# Patient Record
Sex: Female | Born: 1935 | Race: White | Hispanic: No | Marital: Married | Smoking: Never smoker
Health system: Southern US, Community
[De-identification: ages and names within clinical notes are randomized; demographics above are authoritative.]

## PROBLEM LIST (undated history)

## (undated) DIAGNOSIS — N183 Chronic kidney disease, stage 3 unspecified: Secondary | ICD-10-CM

## (undated) DIAGNOSIS — E559 Vitamin D deficiency, unspecified: Secondary | ICD-10-CM

## (undated) DIAGNOSIS — I4821 Permanent atrial fibrillation: Secondary | ICD-10-CM

## (undated) DIAGNOSIS — E785 Hyperlipidemia, unspecified: Secondary | ICD-10-CM

## (undated) DIAGNOSIS — J449 Chronic obstructive pulmonary disease, unspecified: Secondary | ICD-10-CM

## (undated) DIAGNOSIS — E669 Obesity, unspecified: Secondary | ICD-10-CM

## (undated) DIAGNOSIS — H3530 Unspecified macular degeneration: Secondary | ICD-10-CM

## (undated) DIAGNOSIS — M1990 Unspecified osteoarthritis, unspecified site: Secondary | ICD-10-CM

## (undated) DIAGNOSIS — M8580 Other specified disorders of bone density and structure, unspecified site: Secondary | ICD-10-CM

## (undated) DIAGNOSIS — K635 Polyp of colon: Secondary | ICD-10-CM

## (undated) DIAGNOSIS — R519 Headache, unspecified: Secondary | ICD-10-CM

## (undated) DIAGNOSIS — R945 Abnormal results of liver function studies: Secondary | ICD-10-CM

## (undated) DIAGNOSIS — I272 Pulmonary hypertension, unspecified: Secondary | ICD-10-CM

## (undated) DIAGNOSIS — G4733 Obstructive sleep apnea (adult) (pediatric): Secondary | ICD-10-CM

## (undated) DIAGNOSIS — I350 Nonrheumatic aortic (valve) stenosis: Secondary | ICD-10-CM

## (undated) DIAGNOSIS — I10 Essential (primary) hypertension: Secondary | ICD-10-CM

## (undated) DIAGNOSIS — R7989 Other specified abnormal findings of blood chemistry: Secondary | ICD-10-CM

## (undated) DIAGNOSIS — R51 Headache: Secondary | ICD-10-CM

## (undated) DIAGNOSIS — I340 Nonrheumatic mitral (valve) insufficiency: Secondary | ICD-10-CM

## (undated) DIAGNOSIS — E039 Hypothyroidism, unspecified: Secondary | ICD-10-CM

## (undated) DIAGNOSIS — R413 Other amnesia: Secondary | ICD-10-CM

## (undated) HISTORY — DX: Polyp of colon: K63.5

## (undated) HISTORY — PX: BREAST BIOPSY: SHX20

## (undated) HISTORY — DX: Chronic obstructive pulmonary disease, unspecified: J44.9

## (undated) HISTORY — DX: Unspecified osteoarthritis, unspecified site: M19.90

## (undated) HISTORY — PX: CHOLECYSTECTOMY: SHX55

## (undated) HISTORY — DX: Unspecified macular degeneration: H35.30

## (undated) HISTORY — DX: Headache, unspecified: R51.9

## (undated) HISTORY — DX: Nonrheumatic mitral (valve) insufficiency: I34.0

## (undated) HISTORY — DX: Vitamin D deficiency, unspecified: E55.9

## (undated) HISTORY — DX: Obstructive sleep apnea (adult) (pediatric): G47.33

## (undated) HISTORY — DX: Essential (primary) hypertension: I10

## (undated) HISTORY — DX: Hypothyroidism, unspecified: E03.9

## (undated) HISTORY — DX: Obesity, unspecified: E66.9

## (undated) HISTORY — DX: Nonrheumatic aortic (valve) stenosis: I35.0

## (undated) HISTORY — DX: Other specified disorders of bone density and structure, unspecified site: M85.80

## (undated) HISTORY — DX: Chronic kidney disease, stage 3 (moderate): N18.3

## (undated) HISTORY — DX: Abnormal results of liver function studies: R94.5

## (undated) HISTORY — DX: Hyperlipidemia, unspecified: E78.5

## (undated) HISTORY — DX: Chronic kidney disease, stage 3 unspecified: N18.30

## (undated) HISTORY — DX: Other specified abnormal findings of blood chemistry: R79.89

## (undated) HISTORY — DX: Headache: R51

## (undated) HISTORY — PX: KNEE SURGERY: SHX244

## (undated) HISTORY — DX: Other amnesia: R41.3

## (undated) HISTORY — DX: Permanent atrial fibrillation: I48.21

## (undated) HISTORY — DX: Pulmonary hypertension, unspecified: I27.20

---

## 1999-09-20 ENCOUNTER — Encounter: Admission: RE | Admit: 1999-09-20 | Discharge: 1999-09-20 | Payer: Self-pay | Admitting: Family Medicine

## 1999-09-20 ENCOUNTER — Encounter: Payer: Self-pay | Admitting: Family Medicine

## 1999-09-25 ENCOUNTER — Encounter: Admission: RE | Admit: 1999-09-25 | Discharge: 1999-09-25 | Payer: Self-pay | Admitting: Family Medicine

## 1999-09-25 ENCOUNTER — Encounter: Payer: Self-pay | Admitting: Family Medicine

## 1999-11-22 ENCOUNTER — Encounter (INDEPENDENT_AMBULATORY_CARE_PROVIDER_SITE_OTHER): Payer: Self-pay | Admitting: Specialist

## 1999-11-22 ENCOUNTER — Ambulatory Visit (HOSPITAL_COMMUNITY): Admission: RE | Admit: 1999-11-22 | Discharge: 1999-11-22 | Payer: Self-pay | Admitting: Gastroenterology

## 2000-02-24 ENCOUNTER — Encounter (INDEPENDENT_AMBULATORY_CARE_PROVIDER_SITE_OTHER): Payer: Self-pay

## 2000-02-24 ENCOUNTER — Ambulatory Visit (HOSPITAL_COMMUNITY): Admission: RE | Admit: 2000-02-24 | Discharge: 2000-02-24 | Payer: Self-pay | Admitting: Gastroenterology

## 2000-09-25 ENCOUNTER — Encounter: Admission: RE | Admit: 2000-09-25 | Discharge: 2000-09-25 | Payer: Self-pay | Admitting: Family Medicine

## 2000-09-25 ENCOUNTER — Encounter: Payer: Self-pay | Admitting: Family Medicine

## 2000-10-01 ENCOUNTER — Encounter: Payer: Self-pay | Admitting: Emergency Medicine

## 2000-10-01 ENCOUNTER — Encounter (INDEPENDENT_AMBULATORY_CARE_PROVIDER_SITE_OTHER): Payer: Self-pay

## 2000-10-01 ENCOUNTER — Inpatient Hospital Stay (HOSPITAL_COMMUNITY): Admission: EM | Admit: 2000-10-01 | Discharge: 2000-10-08 | Payer: Self-pay | Admitting: Emergency Medicine

## 2000-11-03 ENCOUNTER — Other Ambulatory Visit: Admission: RE | Admit: 2000-11-03 | Discharge: 2000-11-03 | Payer: Self-pay | Admitting: Family Medicine

## 2000-12-16 ENCOUNTER — Ambulatory Visit (HOSPITAL_COMMUNITY): Admission: RE | Admit: 2000-12-16 | Discharge: 2000-12-16 | Payer: Self-pay | Admitting: Cardiology

## 2001-01-15 ENCOUNTER — Inpatient Hospital Stay (HOSPITAL_COMMUNITY): Admission: AD | Admit: 2001-01-15 | Discharge: 2001-01-18 | Payer: Self-pay | Admitting: Cardiology

## 2001-07-20 ENCOUNTER — Encounter (INDEPENDENT_AMBULATORY_CARE_PROVIDER_SITE_OTHER): Payer: Self-pay | Admitting: *Deleted

## 2001-07-20 ENCOUNTER — Ambulatory Visit (HOSPITAL_COMMUNITY): Admission: RE | Admit: 2001-07-20 | Discharge: 2001-07-20 | Payer: Self-pay | Admitting: Gastroenterology

## 2001-08-09 ENCOUNTER — Encounter: Admission: RE | Admit: 2001-08-09 | Discharge: 2001-08-09 | Payer: Self-pay | Admitting: Gastroenterology

## 2001-08-09 ENCOUNTER — Encounter: Payer: Self-pay | Admitting: Gastroenterology

## 2001-12-06 ENCOUNTER — Other Ambulatory Visit: Admission: RE | Admit: 2001-12-06 | Discharge: 2001-12-06 | Payer: Self-pay | Admitting: Family Medicine

## 2002-11-11 ENCOUNTER — Ambulatory Visit (HOSPITAL_COMMUNITY): Admission: RE | Admit: 2002-11-11 | Discharge: 2002-11-11 | Payer: Self-pay | Admitting: Cardiology

## 2002-11-28 ENCOUNTER — Ambulatory Visit (HOSPITAL_COMMUNITY): Admission: RE | Admit: 2002-11-28 | Discharge: 2002-11-28 | Payer: Self-pay | Admitting: Cardiology

## 2003-03-29 ENCOUNTER — Encounter: Admission: RE | Admit: 2003-03-29 | Discharge: 2003-03-29 | Payer: Self-pay | Admitting: Family Medicine

## 2003-03-29 ENCOUNTER — Encounter: Payer: Self-pay | Admitting: Family Medicine

## 2003-12-19 ENCOUNTER — Ambulatory Visit (HOSPITAL_COMMUNITY): Admission: RE | Admit: 2003-12-19 | Discharge: 2003-12-19 | Payer: Self-pay | Admitting: Cardiology

## 2004-03-11 ENCOUNTER — Ambulatory Visit (HOSPITAL_COMMUNITY): Admission: RE | Admit: 2004-03-11 | Discharge: 2004-03-11 | Payer: Self-pay | Admitting: Cardiology

## 2004-04-26 ENCOUNTER — Encounter: Admission: RE | Admit: 2004-04-26 | Discharge: 2004-04-26 | Payer: Self-pay | Admitting: Family Medicine

## 2004-09-27 ENCOUNTER — Other Ambulatory Visit: Admission: RE | Admit: 2004-09-27 | Discharge: 2004-09-27 | Payer: Self-pay | Admitting: Family Medicine

## 2004-12-20 ENCOUNTER — Encounter (INDEPENDENT_AMBULATORY_CARE_PROVIDER_SITE_OTHER): Payer: Self-pay | Admitting: *Deleted

## 2004-12-20 ENCOUNTER — Ambulatory Visit (HOSPITAL_COMMUNITY): Admission: RE | Admit: 2004-12-20 | Discharge: 2004-12-20 | Payer: Self-pay | Admitting: Gastroenterology

## 2005-06-21 ENCOUNTER — Encounter: Admission: RE | Admit: 2005-06-21 | Discharge: 2005-06-21 | Payer: Self-pay | Admitting: Family Medicine

## 2006-01-09 ENCOUNTER — Encounter: Admission: RE | Admit: 2006-01-09 | Discharge: 2006-01-09 | Payer: Self-pay | Admitting: Family Medicine

## 2006-02-02 ENCOUNTER — Encounter: Admission: RE | Admit: 2006-02-02 | Discharge: 2006-02-02 | Payer: Self-pay | Admitting: Family Medicine

## 2006-03-06 ENCOUNTER — Ambulatory Visit (HOSPITAL_COMMUNITY): Admission: RE | Admit: 2006-03-06 | Discharge: 2006-03-06 | Payer: Self-pay | Admitting: Orthopaedic Surgery

## 2007-01-01 ENCOUNTER — Other Ambulatory Visit: Admission: RE | Admit: 2007-01-01 | Discharge: 2007-01-01 | Payer: Self-pay | Admitting: Family Medicine

## 2007-01-14 ENCOUNTER — Encounter: Admission: RE | Admit: 2007-01-14 | Discharge: 2007-01-14 | Payer: Self-pay | Admitting: Family Medicine

## 2008-02-16 ENCOUNTER — Encounter: Admission: RE | Admit: 2008-02-16 | Discharge: 2008-02-16 | Payer: Self-pay | Admitting: Family Medicine

## 2010-09-11 ENCOUNTER — Encounter
Admission: RE | Admit: 2010-09-11 | Discharge: 2010-09-11 | Payer: Self-pay | Source: Home / Self Care | Attending: Family Medicine | Admitting: Family Medicine

## 2010-12-10 ENCOUNTER — Emergency Department (HOSPITAL_COMMUNITY): Payer: PRIVATE HEALTH INSURANCE

## 2010-12-10 ENCOUNTER — Emergency Department (HOSPITAL_COMMUNITY)
Admission: EM | Admit: 2010-12-10 | Discharge: 2010-12-10 | Disposition: A | Discharge status: Home / Self Care | Payer: PRIVATE HEALTH INSURANCE | Attending: Emergency Medicine | Admitting: Emergency Medicine

## 2010-12-10 DIAGNOSIS — Y9229 Other specified public building as the place of occurrence of the external cause: Secondary | ICD-10-CM | POA: Insufficient documentation

## 2010-12-10 DIAGNOSIS — S0083XA Contusion of other part of head, initial encounter: Secondary | ICD-10-CM | POA: Insufficient documentation

## 2010-12-10 DIAGNOSIS — Z7901 Long term (current) use of anticoagulants: Secondary | ICD-10-CM | POA: Insufficient documentation

## 2010-12-10 DIAGNOSIS — I4891 Unspecified atrial fibrillation: Secondary | ICD-10-CM | POA: Insufficient documentation

## 2010-12-10 DIAGNOSIS — R51 Headache: Secondary | ICD-10-CM | POA: Insufficient documentation

## 2010-12-10 DIAGNOSIS — R220 Localized swelling, mass and lump, head: Secondary | ICD-10-CM | POA: Insufficient documentation

## 2010-12-10 DIAGNOSIS — IMO0002 Reserved for concepts with insufficient information to code with codable children: Secondary | ICD-10-CM | POA: Insufficient documentation

## 2010-12-10 DIAGNOSIS — S0003XA Contusion of scalp, initial encounter: Secondary | ICD-10-CM | POA: Insufficient documentation

## 2010-12-10 DIAGNOSIS — W010XXA Fall on same level from slipping, tripping and stumbling without subsequent striking against object, initial encounter: Secondary | ICD-10-CM | POA: Insufficient documentation

## 2010-12-10 DIAGNOSIS — S0990XA Unspecified injury of head, initial encounter: Secondary | ICD-10-CM | POA: Insufficient documentation

## 2010-12-10 DIAGNOSIS — Z79899 Other long term (current) drug therapy: Secondary | ICD-10-CM | POA: Insufficient documentation

## 2010-12-10 DIAGNOSIS — R110 Nausea: Secondary | ICD-10-CM | POA: Insufficient documentation

## 2010-12-10 LAB — PROTIME-INR
INR: 2.55 — ABNORMAL HIGH (ref 0.00–1.49)
Prothrombin Time: 27.5 seconds — ABNORMAL HIGH (ref 11.6–15.2)

## 2011-01-17 NOTE — Op Note (Signed)
Sandra Merritt, Sandra Merritt                   ACCOUNT NO.:  [id number]   MEDICAL RECORD NO.:  [id number]          PATIENT TYPE:  AMB   LOCATION:  SDS                          FACILITY:  [facility]   PHYSICIAN:  Vanita Panda. Magnus Ivan, M.D.DATE OF BIRTH:  11-05-1931   DATE OF PROCEDURE:  03/06/2006  DATE OF DISCHARGE:                                 OPERATIVE REPORT   PREOPERATIVE DIAGNOSIS:  Right knee degenerative joint disease with lateral  meniscal tear.   POSTOPERATIVE DIAGNOSIS:  Right knee severe tricompartmental degenerative  arthritis with lateral meniscal tear.   PROCEDURE:  1.  Right knee arthroscopy with debridement including shaving of articular      cartilage.  2.  Right knee partial lateral meniscectomy.   SURGEON:  Vanita Panda. Magnus Ivan, M.D.   ANESTHESIA:  1.  Local knee block.  2.  IV sedation and mask ventilation.   BLOOD LOSS:  Minimal.   ANTIBIOTICS:  600 mg IV clindamycin.   TOURNIQUET TIME:  None.   COMPLICATIONS:  None.   INDICATIONS:  Briefly Mr. Sandra Merritt is a 77 year old with known tricompartmental  arthritis involving her right knee.  An MRI had been obtained by primary  care physician which did show a lateral meniscal tear.  She had been having  some locking and catching and symptoms of instability of that knee, but her  ligamentous exam was normal; and the MRI showed normal ligaments.  She had  tried physical therapy and a series of injections and actually had even lost  weight, as her knee brace was not fitting. She has atrial fibrillation and  is on chronic Coumadin and would like to avoid any other type of large  surgical intervention, such as total knee replacement, at this time.  It is  recommended that a diagnostic and a therapeutic arthroscopy could help  temporize her knee to give her a level of comfort for functioning and  activities of daily living.  She wished to proceed with surgery and the  risks and benefits of this were explained to her and  well understood; and  she agreed to proceed with surgery.  She has been off Coumadin for several  days.   PROCEDURE DESCRIPTION:  After informed consent was obtained anesthesia and  the right leg was marked, anesthesia was able to obtain a right local knee  block without complications.  She was then brought to the operating room,  placed supine on the operating table and a nonsterile tourniquet was placed  around her upper right thigh.  It was not utilized during the case.  Her leg  was prepped and draped with DuraPrep and sterile drapes including a sterile  stockinette.  A lateral post was used off the side of the bed.  The bed war  raised and the leg was flexed off the side of the bed against the lateral  post.   An anterolateral portal was then made just off the lateral edge of patellar  tendon; and the arthroscope was inserted.  Right away you could see that the  medial meniscus had degenerative fraying with  abundant loss of cartilage of  the knee.  The ACL was found to have diminutive tissue but some intact  fibers.  There was a large osteophyte in the intrachondral notch area. There  was abundant scarring on the lateral side of the knee; and both the anterior  and posterior horn tears of the lateral meniscus.  There were significant  degenerative changes on the cartilage in the medial and lateral  compartments; and underneath the patellar and the trochlear groove there was  grade 4 chondromalacia with large areas of cartilage loss, especially  underneath the patella.  A medial portal was then made off the medial,  anteromedial aspect of the knee just off the patellar tendon at the level of  the inferior pole patella, and arthroscopic shaver was inserted and a large  debridement was carried out of all compartments of the knee, including a  partial lateral meniscectomy.  This was performed without complications.  Then I allowed several liters of fluid to lavage through the knee.   All  instrumentation was removed and with a cannula I tried to remove as much  fluid from the knee as possible.  I then removed all instrumentation and  closed the portal sites with interrupted 4-0 nylon suture.  Xeroform  followed by a well-padded sterile dressings and Ace wraps were applied.  There were no complications.  Final counts were correct.  [name] was  taken to the recovery room alert, talkative, and in stable condition.           ______________________________  Vanita Panda. Magnus Ivan, M.D.     CYB/MEDQ  D:  03/06/2006  T:  03/06/2006  Job:  [id number]

## 2011-01-17 NOTE — Procedures (Signed)
[place]. [facility]  Patient:    Sandra Merritt, Sandra Merritt                          MRN: [id number] Proc. Date: 01/18/01 Adm. Date:  [id number] Attending:  Armanda Magic CC:         Oley Balm. Georgina Pillion, M.D.                           Procedure Report  REFERRING PHYSICIAN:  Oley Balm. Georgina Pillion, M.D.  HISTORY OF PRESENT ILLNESS:  This is a 77 year old, white female, with a history of atrial fibrillation, who initially presented for outpatient cardioversion which was successful but then the patient reverted back to atrial fibrillation.  She was brought into the hospital and loaded with p.o. amiodarone for three days and now presents for DC cardioversion.  PROCEDURES PERFORMED:  Direct-current cardioversion.  INDICATIONS:  Atrial fibrillation, on amiodarone load.  COMPLICATIONS:  None.  MEDICATIONS GIVEN:  Pentothal 250 mg IV.  DESCRIPTION OF PROCEDURE:  After adequate anesthesia was obtained, a synchronized 200 joule shock was delivered with no conversion to sinus rhythm. A total of 360 joule shocks were also delivered with no conversion to sinus rhythm.  The pads were adjusted and a 360 joule synchronized shock was unsuccessful at converting to normal sinus rhythm.  ASSESSMENT: 1. Atrial fibrillation, on amiodarone load. 2. Systemic anticoagulation. 3. Failed direct-current cardioversion today on amiodarone.  PLAN: 1. Continue amiodarone load 400 mg b.i.d. x1 week, then 400 mg a day x1    week, then 200 mg a day maintenance. 2. Coumadin 3 mg a day. 3. PT INR check on Wednesday. 4. Followup with Dr. Mayford Knife in 2 weeks. 5. Will plan outpatient DC cardioversion once amiodarone levels are    therapeutic. DD:  01/18/01 TD:  01/18/01 Job: [id number]

## 2011-01-17 NOTE — Op Note (Signed)
NAMEVEANNA, Sandra Merritt                             ACCOUNT NO.:  [id number]   MEDICAL RECORD NO.:  [id number]                   PATIENT TYPE:  OIB   LOCATION:  2859                                 FACILITY:  [facility]   PHYSICIAN:  Armanda Magic, M.D.                  DATE OF BIRTH:  01-13-1932   DATE OF PROCEDURE:  03/11/2004  DATE OF DISCHARGE:                                 OPERATIVE REPORT   PROCEDURE:  Direct current cardioversion.   OPERATOR:  Armanda Magic, M.D.   INDICATIONS FOR PROCEDURE:  Atrial fibrillation.   COMPLICATIONS:  None.   IV MEDICATIONS:  Pentothal 400 mg IV.   BRIEF HISTORY:  This is a very pleasant 77 year old white female with a  history of atrial fibrillation, currently on Amiodarone, with mild to  moderate mitral regurgitation.  Left atrial size on her last echo was 44  mmHg.  She has a history of paroxysmal atrial fibrillation and had gone back  into atrial fibrillation.  She now presents for DC cardioversion after  having a therapeutic INR of greater than 2.0 for four weeks.   DESCRIPTION OF PROCEDURE:  [facility] in  the fasting, non-sedated state.  Informed consent was obtained.  The patient  was connected to continuous heart rate and pulse oximetry monitoring with  intermittent blood pressure monitoring.  Defibrillator pads were placed on  the anterior and posterior left chest after adequate anesthesia was obtained  with 400 mg of IV Pentothal. A 100 joule synchronized shock was delivered  which was unsuccessful in converting the patient to sinus rhythm.  A second  synchronized shock was delivered at 150 joules which also failed to convert  the patient's rhythm.  A synchronized 200 joule shock was delivered and  successful in converting the patient to normal sinus rhythm.  The patient  tolerated the procedure well with no complications.   ASSESSMENT:  1. Atrial fibrillation on Amiodarone with a therapeutic INR of greater  than     2.0 for four weeks.  2. Successful cardioversion to normal sinus rhythm.   PLAN:  1. Discharge to home when fully awake and alert.  2. Follow-up with Dr. Mayford Knife in two to three weeks.  3. Continue current medications.                                               Armanda Magic, M.D.    TT/MEDQ  D:  03/11/2004  T:  03/11/2004  Job:  [id number]   cc:   Duncan Dull, M.D.  [address]  [place]  [place] [address]  Fax: [phone]

## 2011-01-17 NOTE — Op Note (Signed)
[place]. [facility]  Patient:    Sandra Merritt, Sandra Merritt                          MRN: [id number] Proc. Date: 10/03/00 Adm. Date:  [id number] Attending:  Lonell Face                           Operative Report  PREOPERATIVE DIAGNOSES:  Cholelithiasis and acute cholecystitis.  POSTOPERATIVE DIAGNOSES:  Cholelithiasis and acute cholecystitis.  PROCEDURE:  Laparoscopic cholecystectomy with intraoperative cholangiogram.  SURGEON:  Sharlet Salina T. Hoxworth, M.D.  ASSISTANT:  Velora Heckler, M.D.  ANESTHESIA:  General.  BRIEF HISTORY:  Ms. Onofrio is a 77 year old white female who presented with acute epigastric and substernal pain.  She was found to be in atrial fibrillation, but cardiac workup was otherwise negative.  Ultrasound has shown a gallstone impacted in the gallbladder neck.  She has mild elevation of LFTs. She is felt to have acute cholecystitis, and laparoscopic cholecystectomy with cholangiogram has been recommended and accepted.  The nature of the procedure, its indications, risks of bleeding, infection, bile leak, and bile duct injury, were discussed and understood preoperatively.  She did have _____ this procedure.  DESCRIPTION OF PROCEDURE:  Patient brought to the operating room, placed in the supine position on the operating table and general endotracheal anesthesia induced.  PAS were placed.  Broad-spectrum antibiotics were given.  The abdomen was sterilely prepped and draped.  Local anesthesia was infiltrated into the trocar sites prior to the incisions.  A 1 cm incision was made in the umbilicus, dissection carried down to the midline fascia, which was sharply incised for 1 cm, the peritoneum entered under direct vision.  Under direct vision, a 10 mm trocar was placed in the subxiphoid area and two 5 mm trocars on the right subcostal margin.  The gallbladder was noted to be tense and acutely inflamed.  It was decompressed with a needle  aspirator.  The fundus was then grasped.  Inflammatory adhesions of omentum were stripped down off the gallbladder, and the infundibulum was exposed and retracted laterally. Further adhesions and fibrofatty tissue were stripped off the neck of the gallbladder toward the porta hepatis.  The dissection was rather tedious due to the marked acute inflammation, but it progressed satisfactorily.  There was a large stone impacted in the neck of the gallbladder.  Just distal to that, the gallbladder tapered down quickly and the cystic duct was identified.  The cystic duct-gallbladder junction dissected for 360 degrees.  The cystic duct was dissected over about a centimeter.  The anterior branch of the cystic artery was seen coursing over the gallbladder wall.  When the anatomy was clear, the cystic duct was clipped at the gallbladder junction.  An intraoperative cholangiogram was obtained through the cyst duct.  These were normal with good filling of a normal-sized common bile duct and intrahepatic ducts and free flow into the duodenum and no filling defects.  Following this, the cholangiocatheter was removed, and the cystic duct was triply clipped proximally and divided.  The gallbladder was then dissected free from its bed using hook and spatula cautery.  Posterior branch cystic artery was controlled with clips as well as the anterior branch.  Dissection again was somewhat tedious and moderate bleeding due to the tense inflammation but progressed satisfactory, and the gallbladder was detached from the liver, placed in an Endocatch bag, and  removed from the umbilicus.  The right upper quadrant was thoroughly irrigated and suctioned until clear and hemostasis assured.  A closed-suction drain was brought out from one of the lateral trocar sites and left in Morisons pouch.  Trocars were removed under direct vision and all CO2 evacuated from the peritoneal cavity.  The pursestring suture was secured  at the umbilicus.  Skin incisions were closed with interrupted subcuticular 4-0 Monocryl and Steri-Strips.  Sponge and needle counts were correct.  Dry sterile dressings were applied, and the patient was taken to the recovery room in good condition. DD:  10/03/00 TD:  10/05/00 Job: [id number]

## 2011-01-17 NOTE — Procedures (Signed)
[place]. [facility]  Patient:    Sandra Merritt, Sandra Merritt Visit Number: [id number] MRN: [id number]          Service Type: END Location: ENDO Attending Physician:  Nelda Marseille Dictated by:   Petra Kuba, M.D. Proc. Date: 07/20/01 Admit Date:  07/20/2001   CC:         Armanda Magic, M.D.  Doreatha Lew, M.D.   Procedure Report  PROCEDURE PERFORMED:  Colonoscopy with polypectomy.  ENDOSCOPIST:  Petra Kuba, M.D.  INDICATIONS FOR PROCEDURE:  Patient with large descending polyp due for repeat screening to make sure it was completely removed on last colonoscopy.  Consent was signed after risks, benefits, methods, and options were thoroughly discussed on multiple occasions.  MEDICATIONS USED:  Demerol 60 mg, Versed 6 mg.  DESCRIPTION OF PROCEDURE:  Rectal inspection was pertinent for external hemorrhoids.  Digital exam was negative.  Video colonoscope was inserted and easily advanced around the colon to the cecum.  On insertion, some left-sided diverticula were seen but no obvious large descending polyp.  The cecum was identified by the appendiceal orifice and ileocecal valve.  We did need some left lower quadrant pressure to advance to the cecum with lots of washing and suctioning on the right side of the colon.  A fair visualization was obtained. There was still stool adherent to the wall, but no obvious polypoid lesions or masses were seen.  We elected to withdraw.  The rest of the prep was adequate. The remainder of the hepatic flexure area and transverse were normal except for a small polyp around the splenic flexure which was snared, electrocautery applied and the polyp was suctioned through the scope and collected in the trap.  The scope was slowly withdrawn back to the rectum.  The left-sided sigmoid diverticula were seen.  No signs of the polyp were seen.  Once back in the rectum, the scope was retroflexed, pertinent for some  internal hemorrhoids.  We went ahead and readvanced and on insertion in the middescending, the scar from the previous polypectomy site not done today but from the previous site, we believe, was seen.  There was no obvious residual polyp.  Photodocumentation was obtained.  We went ahead and advanced the scope another 20 cm.  To the approximate level of the splenic flexure and we withdrew one more time.  No additional findings were seen.  Air was suctioned, scope removed.  The patient tolerated the procedure well.  There was no obvious immediate complication.  ENDOSCOPIC DIAGNOSIS: 1. Internal and external hemorrhoids. 2. Left-sided sigmoid diverticula. 3. Old descending polyp scar.  No obvious residual polyp. 4. Approximately level of the splenic flexure, small polyp snared. 5. Fair prep in the cecum and proximal ascending, otherwise negative exam    to the cecum.  PLAN: Await pathology.  Probably recheck colon screening in three years. Proceed with ultrasound at her convenience due to the elevated liver tests and decide follow up at that junction.  Call me sooner p.r.n. As an aside for Dr. Mayford Knife, she remained in sinus rhythm while on the monitor the entire procedure. Dictated by:   Petra Kuba, M.D. Attending Physician:  Nelda Marseille DD:  07/20/01 TD:  07/20/01 Job: [id number]

## 2011-01-17 NOTE — Consult Note (Signed)
[place]. [facility]  Patient:    Sandra Merritt, Sandra Merritt                          MRN: [id number] Proc. Date: 10/01/00 Adm. Date:  [id number] Attending:  Lonell Face CC:         Anastasio Auerbach, M.D.  Doreatha Lew, M.D.   Consultation Report  REQUESTING PHYSICIAN:  Anastasio Auerbach, M.D.  REASON FOR CONSULTATION:  Abdominal pain and gallstones.  HISTORY OF PRESENT ILLNESS:  This is a 77 year old female who yesterday was getting short of breath, climbing stairs, which is atypical for her.  She then had a sandwich for supper and had acute onset of pressure-type substernal chest pain that radiated through to her back and down to her epigastrium.  It was associated with nausea and vomiting.  She came to the emergency department initially.  There was initial concern for cardiac etiology.  She was noted at that time to be in new-onset atrial fibrillation.  She had EKGs and enzymes, which essentially ruled out myocardial ischemia.  She continues to have some intermittent atrial fibrillation.  The pain seemed to settle in her epigastrium, and she had a very mild elevation of her SGOT and alkaline phosphatase.  An abdominal ultrasound was performed, which demonstrated a large gallstone and borderline thickening of the gallbladder wall.  The common bile duct was normal.  Because of this, I was asked to see her.  PAST MEDICAL HISTORY: 1. Hypertension. 2. Hypercholesterolemia. 3. Hypothyroidism. 4. Arthritis.  PREVIOUS OPERATIONS:  None.  ALLERGIES:  PENICILLIN causes a rash.  MEDICATIONS:  Maxzide, Lipitor, Synthroid, and glucosamine.  SOCIAL HISTORY:  She denies use of alcohol or tobacco.  REVIEW OF SYSTEMS:  Denies any hepatitis or diverticulitis.  Denies having any episodes like this before.  PHYSICAL EXAMINATION:  GENERAL:  An obese female in no acute distress, slightly sedated.  She is afebrile.  She is currently in atrial fibrillation.  EYES:   Extraocular motions intact.  Sclerae clear.  NECK:  Supple without palpable masses or adenopathy.  CARDIOVASCULAR:  Irregular rate and irregular rhythm.  RESPIRATORY:  Breath sounds equal and clear.  Respirations are nonlabored.  ABDOMEN:  Soft.  There is mild right upper quadrant tenderness and no guarding present.  No palpable masses or organomegaly noted.  LABORATORY DATA:  Lab data demonstrates a normal white blood cell count, and this has been repeated.  The SGOT is mildly elevated at 46; other liver function tests normal.  Amylase and lipase normal.  IMPRESSION: 1. Biliary colic secondary to cholelithiasis.  She may also have early acute    cholecystitis, although she does not have fever or leukocytosis. 2. New-onset atrial fibrillation.  RECOMMENDATION:  Bowel rest and then start coverage with empiric IV antibiotics.  Would first manage her cardiac issues first and then she could undergo elective cholecystectomy once cardiac issues have been managed. DD:  10/01/00 TD:  10/02/00 Job: [id number]

## 2011-01-17 NOTE — H&P (Signed)
[place]. [facility]  Patient:    Sandra Merritt, Sandra Merritt                          MRN: [id number] Adm. Date:  [id number] Attending:  Doug Sou                         History and Physical  CHIEF COMPLAINT:  Acute anterior chest pain, nausea and vomiting this evening.  HISTORY OF PRESENT ILLNESS:  A 77 year old white female patient of Doreatha Lew, M.D. at [facility] [place].  The patient was well today, except in hindsight, the patient recalls that she was slightly dyspneic on exertion after climbing three flights of stairs this afternoon.  She otherwise felt well and continued about her business.  She ate supper at 6 p.m. without problems.  Then at approximately 7 p.m. while at rest developed acute severe (intensity 10 out of a possible 10) anterior chest and substernal chest pain/pressure radiating to her back associated with vomiting x 5 and diaphoresis.  No definite associated dyspnea.  She tried Gaviscon without relief.  She denied any palpitations or feeling of irregular heartbeat.  She called me as I was oncall this evening and I advised her to go to [facility] ER stat where she was given nitroglycerin sublingually which helped relieved the pain and then nitroglycerin drip has significantly relieved her pain.  Her pain level was between 0 and 2 out of 10 now.  The vomitus was bile-colored without blood.  She was found to be in atrial fibrillation with a rate between 90 and 110.  PAST MEDICAL HISTORY:  Negative for atrial fibrillation or any known cardiac disease.  History positive for hypertension, hypercholesterolemia, hypothyroid, and arthritis.  No history of CHF, any lung disease, diabetes, peptic ulcer disease, or any GI disease.  No history of kidney stones.  ALLERGIES:  PENICILLIN has caused rash and hives.  MEDICATIONS:  Apparently Maxzide 25 mg daily, Lipitor ? mg daily, Synthroid ? mg daily,  Glucosamine.  FAMILY HISTORY:  Mother had MI in her 38s.  Father died at age 60 of complications of a fracture of hip.  Sister positive for diabetes.  SOCIAL HISTORY:  She does not smoke, drink alcohol, or use drugs.  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  REVIEW OF SYSTEMS:  Denies fever, chills, or cough.  Had mild dyspnea upon arriving to ER and O2 via nasal cannula helped.  GASTROINTESTINAL: Mild constipation, but no other GI symptoms other than the acute vomiting and epigastric pain.  She admits that she has mild epigastric and diffuse abdominal pain that is not otherwise localized.  She denies melena or bright red blood per rectum.  Denies any GU symptoms.  PHYSICAL EXAMINATION:  GENERAL:  On admission an alert, very fatigued, mildly uncomfortable white female.  Cooperative.  Obese.  Lysing on stretcher.  VITAL SIGNS:  Pulse oximetry initially 92%, now 98% on O2 via nasal cannula. Pulse 90 to 110, now the pulse is 90.  Respiratory rate 20, temperature 97.9, blood pressure 139/70.  SKIN:  Warm and dry.  HEENT:  Clear.  She wears glasses.  NECK:  Supple.  No adenopathy, JVD, or thyromegaly.  HEART:  Irregularly irregular.  No murmur, gallop, or rub.  LUNGS:  Clear.  ABDOMEN:  Obese.  Bowel sounds positive x 4.  Soft.  Mild epigastric  and diffuse abdominal tenderness without masses, guarding, or rebound.  RECTAL:  Small amount of firm stool in the rectal vault.  Brown hemoccult negative stool on glove.  No masses.  BREASTS: PELVIC:  Deferred to Dr. Adonis Housekeeper.  EXTREMITIES:  Without clubbing, cyanosis, or edema.  OTHER STUDIES:  Chest x-ray clear except borderline cardiac enlargement.  EKG shows atrial fibrillation but no acute ST or T wave changes.  No Q waves. Total CK 59.  The troponin and CK-MB are pending at the time of this dictation. Complete metabolic profile within normal limits except potassium 3.4, glucose 164, alkaline  phosphatase borderline elevated at 122.  Hemoglobin is 14.1, WBC 7.9, lipase normal.  IMPRESSION: 59. A 77 year old female with acute chest pain.  Need to rule out myocardial    infarction.  CK and troponins pending.  In the meantime continue IV    nitroglycerin drip, O2, and other supportive care. 2. New onset atrial fibrillation.  Will persue workup in the hospital.  For    now, start Lovenox for prophylaxis.  She has no definite contraindications    for this and it is indicated for new onset atrial fibrillation. 3. Epigastric pain, nausea and vomiting.  May be secondary to #1 and #2.  No    evidence of acute abdomen.  Also need to rule out a primary GI cause such    as gallbladder or gastritis, once her MI is ruled out.  In the meantime,    symptomatic and supportive care. 4. Hypokalemia.  KCL 20 mEq p.o. stat and then KCL via IV fluids. 5. History of hypercholesterolemia. 6. History of hypertension. 7. The patients care to be continued by the [facility] later in the    a.m.  We are awaiting CK and troponin results.  If any are positive will    consult cardiology stat.  She will need a workup for her atrial    fibrillation. DD:  10/01/00 TD:  10/01/00 Job: [id number]

## 2011-01-17 NOTE — Consult Note (Signed)
[place]. [facility]  Patient:    Sandra Merritt, Sandra Merritt                          MRN: [id number] Proc. Date: 10/01/00 Adm. Date:  [id number] Attending:  Lonell Face CC:         Anastasio Auerbach, M.D.  Oley Balm Georgina Pillion, M.D.   Consultation Report  REFERRING PHYSICIAN:  Anastasio Auerbach, M.D.  PRIMARY PHYSICIAN:  Dr. Georgina Pillion.  CHIEF COMPLAINT:  Chest pain and shortness of breath.  HISTORY OF PRESENT ILLNESS:  This is a 77 year old white female with an insignificant cardiac history but cardiac risk factors, including obesity, hypertension, hyperlipidemia, and a family history of coronary disease.  She was admitted after a prolonged episode of chest pain.  Her chest pain started in the epigastrium with radiation substernally up into her esophagus.  The pain also radiates up into her right upper quadrant, around to her back, and up into her right scapula.  It was associated with severe belching, nausea, and vomiting for several hours.  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  She had had significantly increased shortness of breath while ascending the stairs.  Again, she was going up a large amount of stairs and is very obese. After that, she developed lower abdominal cramping with discomfort, again to the mid and low back and then upper abdomen with pressure radiating to the substernal area.  The discomfort got worse and worse, and she subsequently presented to the emergency room.  On presentation to the emergency room, her EKG was within normal limits except for underlying new-onset atrial fibrillation with controlled ventricular response.  There were no ischemic EKG changes.  PAST MEDICAL HISTORY:  Significant for hypertension, hypercholesterolemia, arthritis, hypothyroidism, hypertension, and dyslipidemia.  She has had removal of colonic polyps in the past.  PAST SURGICAL HISTORY:   None.  ALLERGIES:  PENICILLIN; she develops a rash.  MEDICATIONS:  Gaviscon, Synthroid, Lipitor, aspirin, and hydrochlorothiazide 25/triamterene 37.5.  SOCIAL HISTORY:  She denies any tobacco or alcohol use.  She is self-employed and makes window treatments.  She has been married for 50 years with three daughters.  She does not drink excessive caffeine.  FAMILY HISTORY:  Mother had a MI at age 23.  Her father died at 34, complications from a hip fracture.  She has a sister with diabetes.  REVIEW OF SYSTEMS:  Other than what is stated in the HPI, she has problems with dependent lower extremity edema.  PHYSICAL EXAMINATION:  VITAL SIGNS:  Her blood pressure is 124/62 with a pulse of 88.  Oxygen saturation is 98% on 2 L.  GENERAL:  This is a well-developed, obese white female in no acute distress.  HEENT:  Benign.  NECK:  Supple without lymphadenopathy.  Carotid upstrokes +2 bilaterally with no bruits.  CHEST:  Lungs clear to auscultation.  HEART:  Irregularly irregular with no murmurs, rubs, or gallops.  Normal S1 and S2.  ABDOMEN:  Soft.  Slight tenderness to palpation of the lower abdomen.  EXTREMITIES:  No cyanosis, erythema, or edema.  +1 dorsalis pedis pulses bilaterally.  LABORATORY DATA:  White cell count 7.9, hematocrit 41.2, hemoglobin 17.1, and platelet count 191.  CPK is 5942 with MB of 1.  Troponin is 0.02 and less than 0.01.  Sodium 139, potassium 3.4, chloride 102, bicarb 30, BUN 18, creatinine 0.8,  and glucose 164.  Alkaline phosphatase elevated at 122, lipase normal, and SGOT elevated at 44.  Chest x-ray shows mild cardiomegaly, mild bronchiectatic changes, and left base atelectasis.  EKG shows atrial fibrillation and controlled ventricular response rate at 77.  There are no ST-T wave abnormalities.  Abdominal ultrasound shows large gallstones in the gallbladder with thickened gallbladder wall at 3.6 mm, consistent with acute cholecystitis.  Common  bile duct is dilated at 2.6 mm.  IMPRESSION: 1. Acute cholecystitis with bile duct dilatation.  This is most likely what is    causing her abdominal and chest pain as well as nausea. 2. Exertional shortness of breath, probably due to obesity but potentially due    to underlying coronary disease given her multiple cardiac risk factors. 3. New-onset atrial fibrillation of unknown duration. 4. Hypertension, well controlled at present. 5. Hypothyroidism. 6. Dyslipidemia.  RECOMMENDATIONS: 1. Adenosine Cardiolite in the morning to rule out coronary artery disease. 2. A 2-D echocardiogram to evaluate left atrial size. 3. IV heparin for atrial fibrillation. 4. Will eventually need Coumadin with an INR of 2-3 x 4 weeks with ultimate    DC cardioversion after gallbladder workup. 5. Check thyroid function tests and continue to rule out myocardial infarction    with enzymes. 6. Continue beta blockers for rate control. 7. If the patient needs emergent surgery for her gallbladder, would proceed    from a cardiac standpoint, but if to be done on a semi-emergent basis,    would opt for stress test in the morning first. DD:  10/01/00 TD:  10/02/00 Job: [id number]

## 2011-01-17 NOTE — Discharge Summary (Signed)
[place]. [facility]  Patient:    Sandra Merritt, Sandra Merritt                          MRN: [id number] Adm. Date:  [id number] Disc. Date: [id number] Attending:  Armanda Magic Dictator:   Anselm Lis, N.P. CC:         Oley Balm. Georgina Pillion, M.D.   Discharge Summary  PROCEDURES: 1. On Jan 15, 2001, pulmonary function tests with diffusion capacity of carbon    monoxide (computerized interpretation), mild obstructive disease. 2. On Jan 18, 2001, unsuccessful attempt at bed side of direct current    cardioversion of atrial fibrillation to normal sinus rhythm in setting of    amiodarone load.  DISCHARGE DIAGNOSES: 1. Atrial fibrillation.  The patient with history of recent onset atrial    fibrillation first noted early this year with followup adenosine Cardiolite    negative for ischemia.  A 2D echocardiogram October 02, 2000, revealed    ejection fraction approximately 60% with moderate mitral regurgitation.    The patient had undergone a recent direct current cardioversion to normal    sinus rhythm, but did not maintain.  She was admitted with controlled    ventricular response with digoxin and Lopressor and was started on    amiodarone load 400 mg p.o. b.i.d.  Her QTC remained in acceptable range    and her heart rate was somewhat bradycardic in 40s-60s off the digoxin and    Lopressor.  On Jan 18, 2001, the patient underwent unsuccessful attempt at    bed side cardioversion with 300 then 360 x 2 joules.  We continued    amiodarone load and systemic anticoagulation with plans for repeat    cardioversion once amiodarone level is within therapeutic range. 2. Systemic anticoagulation secondary to atrial fibrillation.  Admission    protime of 21.2 with INR 2.4.  On May 20, her protime was 24.9 with INR    3.2.  She will have followup PT/INR in two days with Dawn in our Coumadin    clinic and subsequently adjusting Coumadin pending those results.  She    will go home on decreased  Coumadin 3 mg p.o. q.h.s. 3. Dyslipidemia on Lipitor. 4. History of hypertension, good control on current medical regimen. 5. History of moderate mitral regurgitation by echocardiogram.  She is on    Altace and tolerating well. 6. On October 03, 2000, laparoscopic cholecystectomy by Dr. Johna Sheriff. 7. Hypothyroidism with thyroid-stimulating hormone 0.743 on admission labs in    February 2002. 8. History of arthritis. 9. History of colonic polyps.  DISPOSITION:  The patient was discharged to home.  CONDITION ON DISCHARGE:  Stable.  DISCHARGE MEDICATIONS: 1. (New) Amiodarone 200 mg tablet two tablets p.o. b.i.d. x 7 days, then two    tablets p.o. q.d. x 7 days, then one tablet p.o. q.d. thereafter. 2. (New) Hydrocortisone cream apply to chest wall t.i.d. p.r.n. paddle burn    irritation. 3. (Decreased dose) Coumadin 3 mg p.o. q.h.s. (three of the 1 mg tablets). 4. Altace 2.5 mg p.o. q.d. 5. Lipitor 10 mg p.o. q.d. 6. Lasix 20 mg p.o. q.d. 7. Pepcid as before.  DIET:  Low fat, low cholesterol, no added salt.  WOUND CARE:  Apply hydrocortisone cream to chest wall burns up to t.i.d.  SPECIAL INSTRUCTIONS:  Stop Lopressor.  Stop digoxin.  FOLLOWUP:  Dr. Armanda Magic on Tuesday, June 4, at 9:30 a.m.  Follow  up with Dawn in our Coumadin clinic on Wednesday, May 22, at 9:30 a.m.  LABORATORY DATA AND X-RAY FINDINGS:  Coagulations as above.  Sodium 140, potassium 4.4, chloride 105, CO2 33, glucose 121, BUN 10, creatinine 0.8. LFTs within normal range, although Alk phos slightly elevated at 162, SGOT elevated at 46.  Thyroid function tests within normal range with T3 uptake of 34.9 (reference range 22.5-37).  FT4 1.33 (reference range 0.89-1.80).  TSH 3.660 (reference range 0.350-5.500).  Admission EKG revealed 48 beats per minute in atrial fibrillation with slow ventricular response, QTC of 379 msec. DD:  01/18/01 TD:  01/19/01 Job: [id number]

## 2011-01-17 NOTE — Discharge Summary (Signed)
[place]. [facility]  Patient:    Sandra Merritt, Sandra Merritt                          MRN: [id number] Adm. Date:  [id number] Disc. Date: 10/08/00 Attending:  Lonell Face CC:         Doreatha Lew, M.D.  Anastasio Auerbach, M.D.  Lorne Skeens. Hoxworth, M.D./Traci Mayford Knife, M.D.   Discharge Summary  DISCHARGE DIAGNOSES:  1. Acute cholecystitis, status post laparoscopic cholecystectomy on     October 03, 2000 by Dr. Johna Sheriff.  2. New onset atrial fibrillation with rapid ventricular response, with     adenosine Cardiolite study negative for ischemia.  3. Mitral regurgitation, 2-3+ per echocardiogram.  4. Anticoagulation for new onset atrial fibrillation, anticipating     cardioversion as an outpatient.  5. Hyperkalemia secondary to dehydration, resolved.  6. Postoperative anemia, with a discharge hemoglobin of 11.8.  7. Hypothyroidism, with TSH of 0.743 on admission.  8. History of hyperlipidemia, with current LDL level of 89, total     cholesterol 152.  9. Hypertension. 10. Arthritis. 11. Allergy to PENICILLIN. 12. History of colon polyps.  DISCHARGE MEDICATIONS:  1. Lopressor 25 mg p.o. b.i.d.  2. Digoxin 0.25 mg p.o. q.d.  3. Coumadin 10 mg p.o. the evening of discharge and thereafter 5 mg p.o.     q.h.s.  4. Vicodin 1-2 q.4h to q.6h p.r.n. pain (#20).  5. Lipitor 10 mg p.o. q.h.s.  6. Synthroid 0.15 mg p.o. q.d.  DISCHARGE DIET: Low-fat diet.  DISCHARGE INSTRUCTIONS: The patient was advised to avoid NSAIDs while anticoagulated on Coumadin.  DISCHARGE CONDITION: The patient at discharge is alert and oriented and in no acute distress, ambulatory and tolerating a diet, in atrial fibrillation with rates approximately in the 80s, with oxygen saturation 98% on room air.  LABORATORY DATA: Discharge laboratories showed a hemoglobin 11.8, hematocrit 36.2.  INR 2.  Discharge creatinine was 0.7.  CONSULTANTS:  1. General surgery, Lorne Skeens. Hoxworth, M.D.  2. Cardiology, Armanda Magic, M.D.  FOLLOW-UP: The patient is to have a laboratory check on Monday, October 12, 2000, at 8:20 a.m. to check the INR and digoxin level.  The patient is to follow up with Dr. Adonis Housekeeper on Monday, October 26, 2000, at noon for vital signs check including blood pressure, heart rate, and basic metabolic panel. Would recommend follow-up thyroid function studies in approximately three weeks.  The patient is to follow up with Dr. Johna Sheriff in ten to 14 days, and to follow up with Dr. Armanda Magic on Monday, October 19, 2000, at 3:45 p.m.  HISTORY OF PRESENT ILLNESS: The patient is a 77 year old white female admitted for dyspnea on exertion while climbing stairs, associated with acute substernal pressure/pain radiating to her epigastrium.  She was noted to be in atrial fibrillation in the ER and given slight abnormalities with her liver function studies an abdominal ultrasound was performed, revealing a large gallstone with borderline thickening of the gallbladder.  HOSPITAL COURSE: #1 - ACUTE CHOLECYSTITIS: The patient, as noted above, underwent an abdominal ultrasound on admission that was consistent with acute cholecystitis with a large gallstone noted with associated gallbladder wall thickening.  She ultimate underwent laparoscopic cholecystectomy with intraoperative cholangiogram on October 03, 2000, with an uneventful recovery from the surgery.  She was initially treated with ciprofloxacin and Flagyl, which were discontinued several days prior to discharge.  #2 - NEW ONSET ATRIAL FIBRILLATION, WITH RAPID VENTRICULAR RESPONSE: The  patient was found to have significant mitral regurgitation by echocardiogram and an adenosine Cardiolite study was negative for ischemia.  She was ultimately rate controlled with both beta-blocker and digoxin, with a discharge ventricular rate in the 80s and blood pressure stable.  She was initiated on Lovenox in the hospital as  well as Coumadin at discharge, with an INR of 2.  Cardiac enzymes were negative for ischemia.  #3 - ANTICOAGULATION FOR ATRIAL FIBRILLATION: As noted above, this is to be followed up by Dr. Lowella Dell office on Monday  #4 - MODERATE MITRAL REGURGITATION BY ECHOCARDIOGRAM: Dr. Mayford Knife anticipates adding an ACE inhibitor if her blood pressure tolerates this.  #5 - POSTOPERATIVE ANEMIA: This was essentially stable, with hemoglobin 11.8 on discharge.  #6 - OBESITY.  #7 - HYPOTHYROIDISM: As noted above, the TSH was normal at 0.743.  Would recommend follow-up studies in approximately three weeks time.  She was continued on her replacement.  #8 - HYPERLIPIDEMIA: This was well controlled on her current statin.  #9 - HISTORY OF HYPERTENSION: The patient was titrated up on Lopressor but ultimately discharged on a dose of 25 mg b.i.d. secondary to low blood pressures.  PROCEDURES:  1. Abdominal ultrasound, consistent with gallstone with gallbladder wall     thickening.  2. Adenosine Cardiolite study on October 03, 1999 negative for reversible     ischemia.  3. A 2D echocardiogram was obtained on October 02, 2000 showing an ejection     fraction of 60% and moderate mitral regurgitation.  4. Laparoscopic cholecystectomy, with intraoperative cholangiogram, performed     on October 03, 2000. DD:  10/08/00 TD:  10/08/00 Job: [id number]

## 2011-01-17 NOTE — Op Note (Signed)
Sandra Merritt, SHERMAN                   ACCOUNT NO.:  [id number]   MEDICAL RECORD NO.:  [id number]          PATIENT TYPE:  AMB   LOCATION:  ENDO                         FACILITY:  [facility]   PHYSICIAN:  Petra Kuba, M.D.    DATE OF BIRTH:  [birth year]   DATE OF PROCEDURE:  12/20/2004  DATE OF DISCHARGE:                                 OPERATIVE REPORT   PROCEDURE:  Colonoscopy with biopsy.   INDICATIONS FOR PROCEDURE:  Patient with history of colon polyps, due for  repeat screening.  Consent was signed after risks, methods and options were  thoroughly discussed multiple times in the past.   MEDICATIONS:  Demerol 80, Versed 8.   DESCRIPTION OF PROCEDURE:  Rectal inspection pertinent for external  hemorrhoids, small.  Digital exam was negative.  The video colonoscope was  inserted and easily advanced with a moderate amount of difficulty due to a  long looping tortuous colon with rolling her on her back and various  abdominal pressures, was able to be advanced to the cecum.  On incision,  some left-sided diverticula were seen but no other abnormalities.  The cecum  was identified by he appendiceal orifice and the ileocecal valve.  The scope  was slowly withdrawn.  The prep was fairly adequate.  Did require a moderate  amount of washing and suctioning for adequate visualization but on slow  withdrawal through the colon, the cecum, ascending and transverse were  normal.  As the scope was withdrawn around the left side of the colon at  about 50 cm, the scar from the previous moderate size descending polyp was  seen and just next to that was a slightly edematous fold.  I doubt this was  a recurrent polyp but a few cold biopsies were obtained.  Photo  documentation was obtained.  The scope was slowly withdrawn.  She had  scattered left-sided diverticula but no other polyps were seen.  Anorectal  pull through and retroflexion confirmed some small hemorrhoids.  Scope was  straightened and  readvanced a short ways up the left side of the colon.  Air  was suctioned and scope removed.  The patient tolerated the procedure well.  There was no obvious immediate complication.   ENDOSCOPIC DIAGNOSES:  1.  Internal and external hemorrhoids.  2.  Left-sided scattered diverticula.  3.  Old scar in the mid descending, questionable polyp versus edematous      fold, status post cold biopsy.  4.  Otherwise within normal limits to the cecum.   PLAN:  Await pathology to determine future colonic screening.  If this is  not a polyp, would repeat in five years.  If doing well medically, happy to  see back p.r.n., otherwise return care to Dr. Kevan Ny for the customary  healthcare maintenance to include yearly rectals and guaiacs.      MEM/MEDQ  D:  12/20/2004  T:  12/20/2004  Job:  [id number]   cc:   Duncan Dull, M.D.  [address]  [place]  [place] [address]  Fax: [phone]

## 2011-01-17 NOTE — Procedures (Signed)
[facility]  Patient:    Sandra Merritt, Sandra Merritt                          MRN: [id number] Proc. Date: 02/24/00 Adm. Date:  [id number] Attending:  Nelda Marseille CC:         Petra Kuba, M.D.             Doreatha Lew, M.D.                           Procedure Report  PROCEDURE:  Colonoscopy with polypectomy.  ENDOSCOPIST:  Petra Kuba, M.D.  INDICATIONS:  Patient with residual polyp on last colonoscopy; want to repeat and try to remove it all.  Consent was signed after risks, benefits, methods and options were thoroughly discussed in the office.  MEDICINES USED:  Demerol 90 mg, Versed 6.5 mg.  DESCRIPTION OF PROCEDURE:  Rectal inspection was pertinent for external hemorrhoids.  Digital exam was negative.  The videocolonoscope was inserted and easily advanced around the colon to the mid-ascending colon.  You could see the ileocecal valve in the distance.  At that point, the scope began to loop.  She was rolled on her back and with abdominal pressure, we were able to advance to the cecal pole.  Prep was adequate.  There was some liquid stool that required washing and suctioning.  On insertion, the polyp at the distal descending was confirmed.  Other than some left-sided diverticula, no other abnormalities were seen on insertion.  The scope was slowly withdrawn.  In the ascending, two tiny 2-mm polyps were seen and were each hot-biopsied x 1.  At the approximate level of the hepatic flexure along the fold, a possible 3- to 4-mm sessile polyp was seen and was hot biopsied x 3.  All of the right-sided polyps were put in the same container.  Other than left-sided diverticula, no other abnormalities were seen as we slowly withdrew back to the polyp in question at the distal descending.  It was about 2 to 3 cm in size.  After all of our snares that we used were marked, we went ahead and piecemealed the polyp.  Two pieces were big enough that we had to suction  onto the head of the scope and remove the scope with the polyp suctioned onto its head.  The polyp was in such a configuration that the snare could not be put over top of the entire polyp.  After multiple piecemeal polypectomies, most of which were suctioned through the scope and collected in the trap, there was a nice white coagulum and possibly some residual adenomatous tissue in the far base, which, using the Argon laser, about five blasts of coagulation were done on the floor wall in the customary fashion.  There was no active bleeding.  At this point, we elected to stop the procedure.  There was no obvious endoscopic polyp left at this juncture, although we could not be sure.  The scope was further withdrawn.  No other additional polyps were seen.  Once back in the rectum, the scope was retroflexed, pertinent for some internal hemorrhoids.  Scope was straightened and readvanced a short ways up the sigmoid, air was suctioned and scope removed.  Patient tolerated the procedure well.  There were no obvious immediate complication.  ENDOSCOPIC DIAGNOSES: 1. Internal and external hemorrhoids. 2. Left-sided diverticula. 3. Large residual polyp at the  distal descending area, status post multiple    snares and then the Argon laser to cauterize the edge of any residual    polyp. 4. Three small right-sided polyps hot-biopsied. 5. Otherwise within normal limits to the cecum.  PLAN:  Await pathology to recheck, but will probably need it in 6 to 12 months, and go ahead and put her on the customary two-week polypectomy orders and have her seen back sooner p.r.n.; otherwise, return care to Dr. Doreatha Lew for customary health care maintenance.DD:  02/24/00 TD:  02/25/00 Job: [id number]

## 2011-01-17 NOTE — H&P (Signed)
[place]  Patient:    Sandra Merritt, Sandra Merritt                          MRN: [id number] Adm. Date:  [id number] Attending:  Armanda Magic Dictator:   Anselm Lis, N.P. CC:         Oley Balm. Georgina Pillion, M.D.   History and Physical  PROBLEMS:  (As dictated by Dr. Evelina Dun) 1. Recent onset atrial fibrillation first noted earlier this year with follow    up Adenosine Cardiolite negative for ischemia. A 2-D echocardiogram    October 02, 2000 revealed ejection fraction of 60% with moderate mitral    regurgitation.    A. Attempted direct current cardioversion to NSR, but did not maintain       currently controlled ventricular response on Digoxin and Lopressor. 2. Dyslipidemia; Lipitor. 3. History of hypertension; well-controlled. 4. Moderate mitral regurgitation by echocardiogram. On Altace and tolerating    well. 5. (October 03, 2000) Laparoscopic cholecystectomy by Dr. Johna Sheriff. 6. Systemic anticoagulation for atrial fibrillation. 7. Hypothyroidism with TSH of 0.743 on admission labs February 2002. 8. History of arthritis. 9. History of colonic polyps.  PLAN: 1. The patient was admitted for initiation of Amiodarone therapy with    anticipated direct current cardioversion to be done Monday, May 20 at    approximately 9:30 a.m. 2. Will obtain baseline studies before starting Amiodarone therapy including    PFTs treated with DLCO, TSH, LFTs. 3. Will hold Lopressor and discontinue Digoxin in a setting of slow    ventricular response. 4. Followup of daily pro time/INR; adjust Coumadin pending those results in    anticipation of Amiodarone will render the Coumadin more efficacious. 5. Follow daily QTC and monitor for prolongation on Amiodarone therapy.  ALLERGIES:  PENICILLIN causes a rash.  MEDICATIONS: 1. Coumadin 5 mg per day. 2. Altace 2.5 mg p.o. q.d. 3. Lipitor 10 mg p.o. q.d. 4. Digoxin 0.25 mg p.o. q.d. 5. Lopressor 100 mg one half tablet  b.i.d. 6. Lasix 20 mg p.o. q.d. 7. Pepcid.  SOCIAL HISTORY AND HABITS:  Tobacco/ETOH; negative. 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  FAMILY HISTORY:  Mother had an MI in her 88s. Dad died at age 26 of complications of hip fracture. Sister positive for diabetes.  REVIEW OF SYSTEMS:  As per HPI/past medical history, otherwise denies problems with excessive light-headedness, dizziness, syncope or near syncopal episodes. Negative chest pain nor discomfort. GI/GU:  Negative for melena or bright red blood PR. Negative dysuria nor hematuria. EXTREMITIES:  Negative pedal edema.  PHYSICAL EXAMINATION:  (As performed by Dr. Armanda Magic)  VITAL SIGNS:  Blood pressure 116/60 with heart rate 68 and irregular, respiratory rate 20, temperature 97.1. Height 5 feet 7.5 inches. Weight 228 pounds.  GENERAL:  She is an obese pleasantly conversant 77 year old female in no acute distress.  NECK:  Brisk bilateral carotid upstroke without bruit. No NVD, no thyromegaly.  CARDIAC:  Irregular rate and rhythm with normal S1 and S2. No murmur, rub nor gallops appreciated.  ABDOMEN:  Soft, nondistended, nontender. Normoactive bowel sounds. Negative abdominal aortic, renal or femoral bruit. Obese.  EXTREMITIES:  Negative pedal edema; distal pulses intact.  LABORATORY DATA:  Her sodium is 140, potassium 4.4, chloride 105, CO2 33, glucose 121, BUN 10, creatinine 0.8. LFTs within normal range, though alkaline phosphatase elevated at 162. SGOT slightly elevated at 46. TSH is pending. Pro  time is 21.2 with INR of 2.4.  October 03, 1999 Adenosine Cardiolite negative for ischemia. October 02, 2000 a 2-D echocardiogram revealing ejection fraction of 60% with moderate MR.  EKG from this admission reveals atrial fibrillation with full ventricular response at 48 beats/min, QTC 379 beats/min. No ischemic changes. DD:  01/15/01 TD:  01/16/01 Job: [id number]

## 2011-09-01 ENCOUNTER — Other Ambulatory Visit: Payer: Self-pay | Admitting: Family Medicine

## 2011-09-01 DIAGNOSIS — Z1231 Encounter for screening mammogram for malignant neoplasm of breast: Secondary | ICD-10-CM

## 2011-09-23 ENCOUNTER — Ambulatory Visit
Admission: RE | Admit: 2011-09-23 | Discharge: 2011-09-23 | Disposition: A | Discharge status: Home / Self Care | Payer: Medicare Other | Source: Ambulatory Visit | Attending: Family Medicine | Admitting: Family Medicine

## 2011-09-23 DIAGNOSIS — Z1231 Encounter for screening mammogram for malignant neoplasm of breast: Secondary | ICD-10-CM

## 2012-08-13 ENCOUNTER — Other Ambulatory Visit: Payer: Self-pay | Admitting: Family Medicine

## 2012-08-13 DIAGNOSIS — Z1231 Encounter for screening mammogram for malignant neoplasm of breast: Secondary | ICD-10-CM

## 2012-09-24 ENCOUNTER — Ambulatory Visit
Admission: RE | Admit: 2012-09-24 | Discharge: 2012-09-24 | Disposition: A | Discharge status: Home / Self Care | Payer: Medicare Other | Source: Ambulatory Visit | Attending: Family Medicine | Admitting: Family Medicine

## 2012-09-24 DIAGNOSIS — Z1231 Encounter for screening mammogram for malignant neoplasm of breast: Secondary | ICD-10-CM

## 2012-10-04 ENCOUNTER — Encounter: Payer: Self-pay | Admitting: Cardiology

## 2013-06-27 ENCOUNTER — Encounter (INDEPENDENT_AMBULATORY_CARE_PROVIDER_SITE_OTHER): Payer: Self-pay

## 2013-06-27 ENCOUNTER — Ambulatory Visit (INDEPENDENT_AMBULATORY_CARE_PROVIDER_SITE_OTHER): Payer: Medicare Other | Admitting: Pharmacist

## 2013-06-27 DIAGNOSIS — I4891 Unspecified atrial fibrillation: Secondary | ICD-10-CM

## 2013-06-27 DIAGNOSIS — I4821 Permanent atrial fibrillation: Secondary | ICD-10-CM | POA: Insufficient documentation

## 2013-06-27 LAB — POCT INR: INR: 3.5

## 2013-07-05 ENCOUNTER — Ambulatory Visit (INDEPENDENT_AMBULATORY_CARE_PROVIDER_SITE_OTHER): Payer: Medicare Other | Admitting: *Deleted

## 2013-07-05 DIAGNOSIS — I4891 Unspecified atrial fibrillation: Secondary | ICD-10-CM

## 2013-07-05 LAB — POCT INR: INR: 2.8

## 2013-07-07 ENCOUNTER — Encounter: Payer: Self-pay | Admitting: Cardiology

## 2013-07-26 ENCOUNTER — Ambulatory Visit (INDEPENDENT_AMBULATORY_CARE_PROVIDER_SITE_OTHER): Payer: Medicare Other | Admitting: *Deleted

## 2013-07-26 DIAGNOSIS — I4891 Unspecified atrial fibrillation: Secondary | ICD-10-CM

## 2013-08-22 ENCOUNTER — Ambulatory Visit (INDEPENDENT_AMBULATORY_CARE_PROVIDER_SITE_OTHER): Payer: Medicare Other | Admitting: *Deleted

## 2013-08-22 DIAGNOSIS — I4891 Unspecified atrial fibrillation: Secondary | ICD-10-CM

## 2013-08-22 LAB — POCT INR: INR: 2.2

## 2013-08-23 ENCOUNTER — Other Ambulatory Visit: Payer: Self-pay

## 2013-08-23 DIAGNOSIS — Z1231 Encounter for screening mammogram for malignant neoplasm of breast: Secondary | ICD-10-CM

## 2013-08-24 ENCOUNTER — Other Ambulatory Visit: Payer: Self-pay | Admitting: Cardiology

## 2013-09-12 ENCOUNTER — Ambulatory Visit: Payer: Medicare Other | Admitting: Cardiology

## 2013-09-12 ENCOUNTER — Ambulatory Visit (INDEPENDENT_AMBULATORY_CARE_PROVIDER_SITE_OTHER): Payer: Medicare HMO | Admitting: *Deleted

## 2013-09-12 DIAGNOSIS — I4891 Unspecified atrial fibrillation: Secondary | ICD-10-CM

## 2013-09-12 LAB — POCT INR: INR: 2.3

## 2013-09-13 ENCOUNTER — Encounter: Payer: Self-pay | Admitting: General Surgery

## 2013-09-13 DIAGNOSIS — I10 Essential (primary) hypertension: Secondary | ICD-10-CM | POA: Insufficient documentation

## 2013-09-13 DIAGNOSIS — I119 Hypertensive heart disease without heart failure: Secondary | ICD-10-CM

## 2013-09-14 ENCOUNTER — Ambulatory Visit: Payer: Medicare Other | Admitting: Cardiology

## 2013-09-15 ENCOUNTER — Ambulatory Visit (INDEPENDENT_AMBULATORY_CARE_PROVIDER_SITE_OTHER): Payer: Medicare HMO | Admitting: Cardiology

## 2013-09-15 ENCOUNTER — Encounter: Payer: Self-pay | Admitting: Cardiology

## 2013-09-15 VITALS — BP 126/80 | HR 76 | Ht 67.0 in | Wt 236.8 lb

## 2013-09-15 DIAGNOSIS — I4891 Unspecified atrial fibrillation: Secondary | ICD-10-CM

## 2013-09-15 DIAGNOSIS — I10 Essential (primary) hypertension: Secondary | ICD-10-CM

## 2013-09-15 LAB — BASIC METABOLIC PANEL
BUN: 16 mg/dL (ref 6–23)
CALCIUM: 9.9 mg/dL (ref 8.4–10.5)
CO2: 30 mEq/L (ref 19–32)
CREATININE: 1.1 mg/dL (ref 0.4–1.2)
Chloride: 106 mEq/L (ref 96–112)
GFR: 50.85 mL/min — ABNORMAL LOW (ref >60.00)
GLUCOSE: 96 mg/dL (ref 70–99)
Potassium: 4.3 mEq/L (ref 3.5–5.1)
SODIUM: 141 meq/L (ref 135–145)

## 2013-09-15 NOTE — Patient Instructions (Signed)
Your physician recommends that you continue on your current medications as directed. Please refer to the Current Medication list given to you today.  Your physician recommends that you go to the lab today for a BMET  Your physician wants you to follow-up in: 6 months with Dr Turner You will receive a reminder letter in the mail two months in advance. If you don't receive a letter, please call our office to schedule the follow-up appointment.  

## 2013-09-15 NOTE — Progress Notes (Signed)
[place], [address], [place]  [address] Phone: [phone] Fax:  [phone]  Date:  09/15/2013   ID:  Sandra Merritt, DOB 1932/03/03, MRN [id number]  PCP:  Sandra Smolder, MD  Cardiologist:  Fransico Him, MD     History of Present Illness: Sandra Merritt is a 80 y.o. female with a history of chronic atrial fibrillation on systemic anticoagulation and HTN who presents today for followup.  She is doing well.  She denies any chest pain, SOB, DOE, LE edema,  palpitations or syncope.  She occasionally has some vertigo.   Wt Readings from Last 3 Encounters:  09/15/13 236 lb 12.8 oz (107.412 kg)     Past Medical History  Diagnosis Date  . Hypertension   . Dyslipidemia   . Hypothyroid   . Heart murmur     Systolic heart murmur w mild AI and MR  . Obesity   . Osteoarthritis   . Colon polyps   . Osteopenia   . COPD (chronic obstructive pulmonary disease)   . Vitamin D deficiency   . OSA (obstructive sleep apnea)     intolerant to CPAP  . CKD (chronic kidney disease), stage III   . LFTs abnormal     History of abnormal LFTs due to Fatty Liver  . Chronic atrial fibrillation     Chronic on coumadin    Current Outpatient Prescriptions  Medication Sig Dispense Refill  . acetaminophen (TYLENOL) 500 MG tablet Take 500 mg by mouth as needed.      . Calcium Carb-Cholecalciferol (CALCIUM + D3) 600-200 MG-UNIT TABS Take 2 tablets by mouth 2 (two) times daily.      . furosemide (LASIX) 20 MG tablet Take 20 mg by mouth as needed for edema (for swelling).      Marland Kitchen glucosamine-chondroitin 500-400 MG tablet Take 1 tablet by mouth 3 (three) times daily.      . hydrochlorothiazide (HYDRODIURIL) 25 MG tablet Take 25 mg by mouth daily.      Marland Kitchen levothyroxine (SYNTHROID, LEVOTHROID) 137 MCG tablet Take 137 mcg by mouth daily before breakfast.      . lovastatin (MEVACOR) 40 MG tablet Take 40 mg by mouth at bedtime.      . metoprolol succinate (TOPROL-XL) 25 MG 24 hr tablet Take 25 mg by  mouth daily.      . Multiple Vitamin (MULTIVITAMIN) tablet Take 1 tablet by mouth daily.      . ramipril (ALTACE) 10 MG capsule TAKE ONE CAPSULE BY MOUTH DAILY  30 capsule  5  . warfarin (COUMADIN) 5 MG tablet Take 5 mg by mouth as directed.       No current facility-administered medications for this visit.    Allergies:    Allergies  Allergen Reactions  . Penicillins Hives and Rash    Social History:  The patient  reports that she has never smoked. She does not have any smokeless tobacco history on file. She reports that she does not drink alcohol or use illicit drugs.   Family History:  The patient's family history is not on file.   ROS:  Please see the history of present illness.      All other systems reviewed and negative.   PHYSICAL EXAM: VS:  BP 126/80  Pulse 76  Ht 5\' 7"  (1.702 m)  Wt 236 lb 12.8 oz (107.412 kg)  BMI 37.08 kg/m2 Well nourished, well developed, in no acute distress HEENT: normal Neck: no JVD Cardiac:  normal S1, S2; RRR; no murmur Lungs:  clear to auscultation bilaterally, no wheezing, rhonchi or rales Abd: soft, nontender, no hepatomegaly Ext: no edema Skin: warm and dry Neuro:  CNs 2-12 intact, no focal abnormalities noted  EKG:     Atrial fibrillation  ASSESSMENT AND PLAN:  1. Chronic atrial fibrillation rate controlled  - continue Metoprolol/Warfarin 2. HTN well controlled  - continue HCTZ/metoprolol and ramipril  - check BMET  Followup with me in 6 months  Signed, Fransico Him, MD 09/15/2013 11:17 AM

## 2013-09-27 ENCOUNTER — Ambulatory Visit
Admission: RE | Admit: 2013-09-27 | Discharge: 2013-09-27 | Disposition: A | Discharge status: Home / Self Care | Payer: Self-pay | Source: Ambulatory Visit

## 2013-09-27 DIAGNOSIS — Z1231 Encounter for screening mammogram for malignant neoplasm of breast: Secondary | ICD-10-CM

## 2013-10-10 ENCOUNTER — Ambulatory Visit (INDEPENDENT_AMBULATORY_CARE_PROVIDER_SITE_OTHER): Payer: Medicare HMO | Admitting: *Deleted

## 2013-10-10 DIAGNOSIS — I4891 Unspecified atrial fibrillation: Secondary | ICD-10-CM

## 2013-10-10 LAB — POCT INR: INR: 5.1

## 2013-10-21 ENCOUNTER — Ambulatory Visit (INDEPENDENT_AMBULATORY_CARE_PROVIDER_SITE_OTHER): Payer: Medicare HMO | Admitting: Pharmacist

## 2013-10-21 DIAGNOSIS — I4891 Unspecified atrial fibrillation: Secondary | ICD-10-CM

## 2013-10-21 LAB — POCT INR: INR: 2.9

## 2013-11-04 ENCOUNTER — Ambulatory Visit (INDEPENDENT_AMBULATORY_CARE_PROVIDER_SITE_OTHER): Payer: Medicare HMO

## 2013-11-04 DIAGNOSIS — Z5181 Encounter for therapeutic drug level monitoring: Secondary | ICD-10-CM

## 2013-11-04 DIAGNOSIS — I4891 Unspecified atrial fibrillation: Secondary | ICD-10-CM

## 2013-11-04 DIAGNOSIS — Z7189 Other specified counseling: Secondary | ICD-10-CM | POA: Insufficient documentation

## 2013-11-04 LAB — POCT INR: INR: 1.7

## 2013-11-18 ENCOUNTER — Ambulatory Visit (INDEPENDENT_AMBULATORY_CARE_PROVIDER_SITE_OTHER): Payer: Medicare HMO | Admitting: *Deleted

## 2013-11-18 DIAGNOSIS — Z5181 Encounter for therapeutic drug level monitoring: Secondary | ICD-10-CM

## 2013-11-18 DIAGNOSIS — I4891 Unspecified atrial fibrillation: Secondary | ICD-10-CM

## 2013-11-18 LAB — POCT INR: INR: 2.2

## 2013-12-02 ENCOUNTER — Ambulatory Visit (INDEPENDENT_AMBULATORY_CARE_PROVIDER_SITE_OTHER): Payer: Medicare HMO | Admitting: *Deleted

## 2013-12-02 DIAGNOSIS — I4891 Unspecified atrial fibrillation: Secondary | ICD-10-CM

## 2013-12-02 DIAGNOSIS — Z5181 Encounter for therapeutic drug level monitoring: Secondary | ICD-10-CM

## 2013-12-02 LAB — POCT INR: INR: 2.6

## 2013-12-30 ENCOUNTER — Ambulatory Visit (INDEPENDENT_AMBULATORY_CARE_PROVIDER_SITE_OTHER): Payer: Medicare HMO

## 2013-12-30 DIAGNOSIS — I4891 Unspecified atrial fibrillation: Secondary | ICD-10-CM

## 2013-12-30 DIAGNOSIS — Z5181 Encounter for therapeutic drug level monitoring: Secondary | ICD-10-CM

## 2013-12-30 LAB — POCT INR: INR: 2.8

## 2014-02-10 ENCOUNTER — Ambulatory Visit (INDEPENDENT_AMBULATORY_CARE_PROVIDER_SITE_OTHER): Payer: Medicare HMO

## 2014-02-10 DIAGNOSIS — I4891 Unspecified atrial fibrillation: Secondary | ICD-10-CM

## 2014-02-10 DIAGNOSIS — Z5181 Encounter for therapeutic drug level monitoring: Secondary | ICD-10-CM

## 2014-02-10 LAB — POCT INR: INR: 3.4

## 2014-03-06 ENCOUNTER — Ambulatory Visit (INDEPENDENT_AMBULATORY_CARE_PROVIDER_SITE_OTHER): Payer: Medicare HMO | Admitting: *Deleted

## 2014-03-06 DIAGNOSIS — Z5181 Encounter for therapeutic drug level monitoring: Secondary | ICD-10-CM

## 2014-03-06 DIAGNOSIS — I4891 Unspecified atrial fibrillation: Secondary | ICD-10-CM

## 2014-03-06 LAB — POCT INR: INR: 5.4

## 2014-03-13 ENCOUNTER — Ambulatory Visit (INDEPENDENT_AMBULATORY_CARE_PROVIDER_SITE_OTHER): Payer: Medicare HMO | Admitting: *Deleted

## 2014-03-13 DIAGNOSIS — Z5181 Encounter for therapeutic drug level monitoring: Secondary | ICD-10-CM

## 2014-03-13 DIAGNOSIS — I4891 Unspecified atrial fibrillation: Secondary | ICD-10-CM

## 2014-03-13 LAB — POCT INR: INR: 2.7

## 2014-03-27 ENCOUNTER — Ambulatory Visit (INDEPENDENT_AMBULATORY_CARE_PROVIDER_SITE_OTHER): Payer: Medicare HMO | Admitting: Pharmacist

## 2014-03-27 DIAGNOSIS — Z5181 Encounter for therapeutic drug level monitoring: Secondary | ICD-10-CM

## 2014-03-27 DIAGNOSIS — I4891 Unspecified atrial fibrillation: Secondary | ICD-10-CM

## 2014-03-27 LAB — POCT INR: INR: 2.1

## 2014-04-12 ENCOUNTER — Encounter: Payer: Self-pay | Admitting: Cardiology

## 2014-04-24 ENCOUNTER — Ambulatory Visit (INDEPENDENT_AMBULATORY_CARE_PROVIDER_SITE_OTHER): Payer: Medicare HMO | Admitting: *Deleted

## 2014-04-24 DIAGNOSIS — Z5181 Encounter for therapeutic drug level monitoring: Secondary | ICD-10-CM

## 2014-04-24 DIAGNOSIS — I4891 Unspecified atrial fibrillation: Secondary | ICD-10-CM

## 2014-04-24 LAB — POCT INR: INR: 5

## 2014-04-28 ENCOUNTER — Other Ambulatory Visit: Payer: Self-pay | Admitting: *Deleted

## 2014-04-28 MED ORDER — WARFARIN SODIUM 5 MG PO TABS: ORAL_TABLET | ORAL | Status: DC

## 2014-04-28 MED ORDER — RAMIPRIL 10 MG PO CAPS: ORAL_CAPSULE | ORAL | Status: DC

## 2014-05-01 ENCOUNTER — Ambulatory Visit (INDEPENDENT_AMBULATORY_CARE_PROVIDER_SITE_OTHER): Payer: Medicare HMO | Admitting: *Deleted

## 2014-05-01 ENCOUNTER — Other Ambulatory Visit: Payer: Self-pay

## 2014-05-01 DIAGNOSIS — I4891 Unspecified atrial fibrillation: Secondary | ICD-10-CM

## 2014-05-01 DIAGNOSIS — Z5181 Encounter for therapeutic drug level monitoring: Secondary | ICD-10-CM

## 2014-05-01 LAB — POCT INR: INR: 1.3

## 2014-05-01 MED ORDER — WARFARIN SODIUM 5 MG PO TABS: ORAL_TABLET | ORAL | Status: DC

## 2014-05-10 ENCOUNTER — Ambulatory Visit (INDEPENDENT_AMBULATORY_CARE_PROVIDER_SITE_OTHER): Payer: Medicare HMO | Admitting: *Deleted

## 2014-05-10 DIAGNOSIS — Z5181 Encounter for therapeutic drug level monitoring: Secondary | ICD-10-CM

## 2014-05-10 DIAGNOSIS — I4891 Unspecified atrial fibrillation: Secondary | ICD-10-CM

## 2014-05-10 LAB — POCT INR: INR: 3.9

## 2014-05-19 ENCOUNTER — Ambulatory Visit (INDEPENDENT_AMBULATORY_CARE_PROVIDER_SITE_OTHER): Payer: Medicare HMO | Admitting: *Deleted

## 2014-05-19 DIAGNOSIS — I4891 Unspecified atrial fibrillation: Secondary | ICD-10-CM

## 2014-05-19 DIAGNOSIS — Z5181 Encounter for therapeutic drug level monitoring: Secondary | ICD-10-CM

## 2014-05-19 LAB — POCT INR: INR: 2.9

## 2014-05-29 ENCOUNTER — Other Ambulatory Visit: Payer: Self-pay

## 2014-05-29 MED ORDER — RAMIPRIL 10 MG PO CAPS: ORAL_CAPSULE | ORAL | Status: DC

## 2014-06-02 ENCOUNTER — Ambulatory Visit (INDEPENDENT_AMBULATORY_CARE_PROVIDER_SITE_OTHER): Payer: Medicare HMO | Admitting: *Deleted

## 2014-06-02 DIAGNOSIS — I4891 Unspecified atrial fibrillation: Secondary | ICD-10-CM

## 2014-06-02 DIAGNOSIS — Z5181 Encounter for therapeutic drug level monitoring: Secondary | ICD-10-CM

## 2014-06-02 LAB — POCT INR: INR: 3.2

## 2014-06-16 ENCOUNTER — Ambulatory Visit (INDEPENDENT_AMBULATORY_CARE_PROVIDER_SITE_OTHER): Payer: Medicare HMO | Admitting: *Deleted

## 2014-06-16 DIAGNOSIS — Z5181 Encounter for therapeutic drug level monitoring: Secondary | ICD-10-CM

## 2014-06-16 DIAGNOSIS — I4891 Unspecified atrial fibrillation: Secondary | ICD-10-CM

## 2014-06-16 LAB — POCT INR: INR: 1.7

## 2014-06-30 ENCOUNTER — Ambulatory Visit (INDEPENDENT_AMBULATORY_CARE_PROVIDER_SITE_OTHER): Payer: Medicare HMO | Admitting: Pharmacist

## 2014-06-30 DIAGNOSIS — I4891 Unspecified atrial fibrillation: Secondary | ICD-10-CM

## 2014-06-30 DIAGNOSIS — Z5181 Encounter for therapeutic drug level monitoring: Secondary | ICD-10-CM

## 2014-06-30 LAB — POCT INR: INR: 5.1

## 2014-07-17 ENCOUNTER — Ambulatory Visit (INDEPENDENT_AMBULATORY_CARE_PROVIDER_SITE_OTHER): Payer: Medicare HMO | Admitting: Cardiology

## 2014-07-17 ENCOUNTER — Ambulatory Visit (INDEPENDENT_AMBULATORY_CARE_PROVIDER_SITE_OTHER)
Admission: RE | Admit: 2014-07-17 | Discharge: 2014-07-17 | Disposition: A | Discharge status: Home / Self Care | Payer: Medicare HMO | Source: Ambulatory Visit | Attending: Cardiology | Admitting: Cardiology

## 2014-07-17 ENCOUNTER — Encounter: Payer: Self-pay | Admitting: Cardiology

## 2014-07-17 ENCOUNTER — Ambulatory Visit (INDEPENDENT_AMBULATORY_CARE_PROVIDER_SITE_OTHER): Payer: Medicare HMO | Admitting: *Deleted

## 2014-07-17 VITALS — BP 128/74 | HR 73 | Ht 67.0 in | Wt 232.0 lb

## 2014-07-17 DIAGNOSIS — G4452 New daily persistent headache (NDPH): Secondary | ICD-10-CM

## 2014-07-17 DIAGNOSIS — R51 Headache: Secondary | ICD-10-CM

## 2014-07-17 DIAGNOSIS — I10 Essential (primary) hypertension: Secondary | ICD-10-CM

## 2014-07-17 DIAGNOSIS — I482 Chronic atrial fibrillation, unspecified: Secondary | ICD-10-CM

## 2014-07-17 DIAGNOSIS — R519 Headache, unspecified: Secondary | ICD-10-CM | POA: Insufficient documentation

## 2014-07-17 DIAGNOSIS — I4891 Unspecified atrial fibrillation: Secondary | ICD-10-CM

## 2014-07-17 DIAGNOSIS — Z5181 Encounter for therapeutic drug level monitoring: Secondary | ICD-10-CM

## 2014-07-17 LAB — BASIC METABOLIC PANEL
BUN: 15 mg/dL (ref 6–23)
CALCIUM: 9.5 mg/dL (ref 8.4–10.5)
CO2: 25 mEq/L (ref 19–32)
Chloride: 106 mEq/L (ref 96–112)
Creatinine, Ser: 0.9 mg/dL (ref 0.4–1.2)
GFR: 65.65 mL/min (ref >60.00)
GLUCOSE: 94 mg/dL (ref 70–99)
Potassium: 4.4 mEq/L (ref 3.5–5.1)
SODIUM: 141 meq/L (ref 135–145)

## 2014-07-17 LAB — POCT INR: INR: 4

## 2014-07-17 NOTE — Patient Instructions (Addendum)
Your physician recommends that you have lab work TODAY (BMET).   Non-Cardiac CT scanning, (CAT scanning), is a noninvasive, special x-ray that produces cross-sectional images of the body using x-rays and a computer. CT scans help physicians diagnose and treat medical conditions. For some CT exams, a contrast material is used to enhance visibility in the area of the body being studied. CT scans provide greater clarity and reveal more details than regular x-ray exams.  Your physician wants you to follow-up in: 1 year with Dr. Radford Pax. You will receive a reminder letter in the mail two months in advance. If you don't receive a letter, please call our office to schedule the follow-up appointment.

## 2014-07-17 NOTE — Progress Notes (Signed)
[place], [address] [place], [place]  [address] Phone: [phone] Fax:  [phone]  Date:  07/17/2014   ID:  Sandra Merritt, DOB [birth year], MRN [id number]  PCP:  Marjorie Smolder, MD  Cardiologist:  Fransico Him, MD    History of Present Illness: Sandra Merritt is a 80 y.o. female with a history of chronic atrial fibrillation on systemic anticoagulation and HTN who presents today for followup. She is doing well. She denies any chest pain, SOB, DOE, LE edema, palpitations or syncope. She occasionally has some vertigo.  She has had some headaches recently that seem to be more frequent.  She has a history of migraine HA's.      Wt Readings from Last 3 Encounters:  07/17/14 232 lb (105.235 kg)  09/15/13 236 lb 12.8 oz (107.412 kg)     Past Medical History  Diagnosis Date  . Hypertension   . Dyslipidemia   . Hypothyroid   . Heart murmur     Systolic heart murmur w mild AI and MR  . Obesity   . Osteoarthritis   . Colon polyps   . Osteopenia   . COPD (chronic obstructive pulmonary disease)   . Vitamin D deficiency   . OSA (obstructive sleep apnea)     intolerant to CPAP  . CKD (chronic kidney disease), stage III   . LFTs abnormal     History of abnormal LFTs due to Fatty Liver  . Chronic atrial fibrillation     Chronic on coumadin    Current Outpatient Prescriptions  Medication Sig Dispense Refill  . acetaminophen (TYLENOL) 500 MG tablet Take 500 mg by mouth as needed.    . Calcium Carb-Cholecalciferol (CALCIUM + D3) 600-200 MG-UNIT TABS Take 2 tablets by mouth 2 (two) times daily.    Marland Kitchen FLUZONE HIGH-DOSE 0.5 ML SUSY   0  . furosemide (LASIX) 20 MG tablet Take 20 mg by mouth as needed for edema (for swelling).    Marland Kitchen glucosamine-chondroitin 500-400 MG tablet Take 1 tablet by mouth 3 (three) times daily.    . hydrochlorothiazide (HYDRODIURIL) 25 MG tablet Take 25 mg by mouth daily.    Marland Kitchen levothyroxine (SYNTHROID, LEVOTHROID) 137 MCG tablet Take 137 mcg by mouth daily  before breakfast.    . lovastatin (MEVACOR) 40 MG tablet Take 40 mg by mouth at bedtime.    . metoprolol succinate (TOPROL-XL) 25 MG 24 hr tablet Take 25 mg by mouth daily.    . Multiple Vitamin (MULTIVITAMIN) tablet Take 1 tablet by mouth daily.    . ramipril (ALTACE) 10 MG capsule TAKE ONE CAPSULE BY MOUTH DAILY 30 capsule 3  . warfarin (COUMADIN) 5 MG tablet Take as directed by Coumadin clinic 30 tablet 3   No current facility-administered medications for this visit.    Allergies:    Allergies  Allergen Reactions  . Penicillins Hives and Rash    Social History:  The patient  reports that she has never smoked. She does not have any smokeless tobacco history on file. She reports that she does not drink alcohol or use illicit drugs.   Family History:  The patient's family history is not on file.   ROS:  Please see the history of present illness.      All other systems reviewed and negative.   PHYSICAL EXAM: VS:  BP 128/74 mmHg  Pulse 73  Ht 5\' 7"  (1.702 m)  Wt 232 lb (105.235 kg)  BMI 36.33 kg/m2 Well nourished, well  developed, in no acute distress HEENT: normal Neck: no JVD Cardiac:  normal S1, S2; RRR; no murmur Lungs:  clear to auscultation bilaterally, no wheezing, rhonchi or rales Abd: soft, nontender, no hepatomegaly Ext: no edema Skin: warm and dry Neuro:  CNs 2-12 intact, no focal abnormalities noted  ASSESSMENT AND PLAN:  1. Chronic atrial fibrillation rate controlled - continue Metoprolol/Warfarin 2. HTN well controlled - continue HCTZ/metoprolol and ramipril - check BMET   3.       Headaches with a history of Migraine HA's but now with increasing frequency - I get a head CT since she is on chronic warfarin  Followup with me in 1 year  Signed, Fransico Him, MD [facility] 07/17/2014 3:05 PM

## 2014-07-18 ENCOUNTER — Other Ambulatory Visit: Payer: Self-pay

## 2014-07-18 ENCOUNTER — Other Ambulatory Visit: Payer: Self-pay | Admitting: *Deleted

## 2014-07-18 DIAGNOSIS — G4452 New daily persistent headache (NDPH): Secondary | ICD-10-CM

## 2014-07-18 MED ORDER — RAMIPRIL 10 MG PO CAPS: ORAL_CAPSULE | ORAL | Status: DC

## 2014-07-31 ENCOUNTER — Ambulatory Visit (INDEPENDENT_AMBULATORY_CARE_PROVIDER_SITE_OTHER): Payer: Medicare HMO

## 2014-07-31 DIAGNOSIS — Z5181 Encounter for therapeutic drug level monitoring: Secondary | ICD-10-CM

## 2014-07-31 DIAGNOSIS — I4891 Unspecified atrial fibrillation: Secondary | ICD-10-CM

## 2014-07-31 LAB — POCT INR: INR: 2.1

## 2014-08-14 ENCOUNTER — Ambulatory Visit (INDEPENDENT_AMBULATORY_CARE_PROVIDER_SITE_OTHER): Payer: Medicare HMO | Admitting: Surgery

## 2014-08-14 DIAGNOSIS — Z5181 Encounter for therapeutic drug level monitoring: Secondary | ICD-10-CM

## 2014-08-14 DIAGNOSIS — I4891 Unspecified atrial fibrillation: Secondary | ICD-10-CM

## 2014-08-14 LAB — POCT INR: INR: 2.7

## 2014-09-04 ENCOUNTER — Ambulatory Visit (INDEPENDENT_AMBULATORY_CARE_PROVIDER_SITE_OTHER): Payer: 59 | Admitting: *Deleted

## 2014-09-04 DIAGNOSIS — I4891 Unspecified atrial fibrillation: Secondary | ICD-10-CM

## 2014-09-04 DIAGNOSIS — Z5181 Encounter for therapeutic drug level monitoring: Secondary | ICD-10-CM

## 2014-09-04 LAB — POCT INR: INR: 2.4

## 2014-09-07 ENCOUNTER — Other Ambulatory Visit: Payer: Self-pay

## 2014-09-07 DIAGNOSIS — Z1231 Encounter for screening mammogram for malignant neoplasm of breast: Secondary | ICD-10-CM

## 2014-09-07 DIAGNOSIS — Z9889 Other specified postprocedural states: Secondary | ICD-10-CM

## 2014-09-07 DIAGNOSIS — Z853 Personal history of malignant neoplasm of breast: Secondary | ICD-10-CM

## 2014-09-28 ENCOUNTER — Ambulatory Visit
Admission: RE | Admit: 2014-09-28 | Discharge: 2014-09-28 | Disposition: A | Discharge status: Home / Self Care | Payer: 59 | Source: Ambulatory Visit

## 2014-09-28 ENCOUNTER — Encounter (INDEPENDENT_AMBULATORY_CARE_PROVIDER_SITE_OTHER): Payer: Self-pay

## 2014-09-28 DIAGNOSIS — Z853 Personal history of malignant neoplasm of breast: Secondary | ICD-10-CM

## 2014-09-28 DIAGNOSIS — Z9889 Other specified postprocedural states: Secondary | ICD-10-CM

## 2014-09-28 DIAGNOSIS — Z1231 Encounter for screening mammogram for malignant neoplasm of breast: Secondary | ICD-10-CM

## 2014-10-02 ENCOUNTER — Ambulatory Visit (INDEPENDENT_AMBULATORY_CARE_PROVIDER_SITE_OTHER): Payer: 59

## 2014-10-02 DIAGNOSIS — Z5181 Encounter for therapeutic drug level monitoring: Secondary | ICD-10-CM | POA: Diagnosis not present

## 2014-10-02 DIAGNOSIS — I4891 Unspecified atrial fibrillation: Secondary | ICD-10-CM | POA: Diagnosis not present

## 2014-10-02 LAB — POCT INR: INR: 2.3

## 2014-10-04 ENCOUNTER — Other Ambulatory Visit: Payer: Self-pay | Admitting: Cardiology

## 2014-10-23 ENCOUNTER — Encounter: Payer: Self-pay | Admitting: Cardiology

## 2014-11-13 ENCOUNTER — Ambulatory Visit (INDEPENDENT_AMBULATORY_CARE_PROVIDER_SITE_OTHER): Payer: Medicare PPO | Admitting: *Deleted

## 2014-11-13 DIAGNOSIS — Z5181 Encounter for therapeutic drug level monitoring: Secondary | ICD-10-CM

## 2014-11-13 DIAGNOSIS — I4891 Unspecified atrial fibrillation: Secondary | ICD-10-CM

## 2014-11-13 LAB — POCT INR: INR: 2.7

## 2014-11-21 ENCOUNTER — Other Ambulatory Visit: Payer: Self-pay | Admitting: Cardiology

## 2014-12-11 ENCOUNTER — Other Ambulatory Visit: Payer: Self-pay | Admitting: Cardiology

## 2014-12-25 ENCOUNTER — Ambulatory Visit (INDEPENDENT_AMBULATORY_CARE_PROVIDER_SITE_OTHER): Payer: Medicare PPO | Admitting: *Deleted

## 2014-12-25 DIAGNOSIS — I4891 Unspecified atrial fibrillation: Secondary | ICD-10-CM | POA: Diagnosis not present

## 2014-12-25 DIAGNOSIS — Z5181 Encounter for therapeutic drug level monitoring: Secondary | ICD-10-CM | POA: Diagnosis not present

## 2014-12-25 LAB — POCT INR: INR: 2.3

## 2015-02-05 ENCOUNTER — Ambulatory Visit (INDEPENDENT_AMBULATORY_CARE_PROVIDER_SITE_OTHER): Payer: Medicare PPO | Admitting: *Deleted

## 2015-02-05 DIAGNOSIS — I4891 Unspecified atrial fibrillation: Secondary | ICD-10-CM

## 2015-02-05 DIAGNOSIS — Z5181 Encounter for therapeutic drug level monitoring: Secondary | ICD-10-CM | POA: Diagnosis not present

## 2015-02-05 LAB — POCT INR: INR: 2.8

## 2015-03-19 ENCOUNTER — Ambulatory Visit (INDEPENDENT_AMBULATORY_CARE_PROVIDER_SITE_OTHER): Payer: Medicare PPO | Admitting: *Deleted

## 2015-03-19 DIAGNOSIS — I4891 Unspecified atrial fibrillation: Secondary | ICD-10-CM

## 2015-03-19 DIAGNOSIS — Z5181 Encounter for therapeutic drug level monitoring: Secondary | ICD-10-CM

## 2015-03-19 LAB — POCT INR: INR: 3.3

## 2015-04-11 ENCOUNTER — Encounter: Payer: Self-pay | Admitting: Cardiology

## 2015-04-23 ENCOUNTER — Ambulatory Visit (INDEPENDENT_AMBULATORY_CARE_PROVIDER_SITE_OTHER): Payer: Medicare PPO | Admitting: *Deleted

## 2015-04-23 DIAGNOSIS — I4891 Unspecified atrial fibrillation: Secondary | ICD-10-CM | POA: Diagnosis not present

## 2015-04-23 DIAGNOSIS — Z5181 Encounter for therapeutic drug level monitoring: Secondary | ICD-10-CM | POA: Diagnosis not present

## 2015-04-23 LAB — POCT INR: INR: 1.5

## 2015-05-01 ENCOUNTER — Ambulatory Visit (INDEPENDENT_AMBULATORY_CARE_PROVIDER_SITE_OTHER): Payer: Medicare PPO

## 2015-05-01 DIAGNOSIS — I4891 Unspecified atrial fibrillation: Secondary | ICD-10-CM | POA: Diagnosis not present

## 2015-05-01 DIAGNOSIS — Z5181 Encounter for therapeutic drug level monitoring: Secondary | ICD-10-CM | POA: Diagnosis not present

## 2015-05-01 LAB — POCT INR: INR: 1.8

## 2015-05-30 ENCOUNTER — Ambulatory Visit (INDEPENDENT_AMBULATORY_CARE_PROVIDER_SITE_OTHER): Payer: Medicare PPO | Admitting: *Deleted

## 2015-05-30 DIAGNOSIS — Z5181 Encounter for therapeutic drug level monitoring: Secondary | ICD-10-CM | POA: Diagnosis not present

## 2015-05-30 DIAGNOSIS — I4891 Unspecified atrial fibrillation: Secondary | ICD-10-CM | POA: Diagnosis not present

## 2015-05-30 LAB — POCT INR: INR: 1.7

## 2015-06-13 ENCOUNTER — Ambulatory Visit (INDEPENDENT_AMBULATORY_CARE_PROVIDER_SITE_OTHER): Payer: Medicare PPO | Admitting: *Deleted

## 2015-06-13 DIAGNOSIS — Z5181 Encounter for therapeutic drug level monitoring: Secondary | ICD-10-CM | POA: Diagnosis not present

## 2015-06-13 DIAGNOSIS — I4891 Unspecified atrial fibrillation: Secondary | ICD-10-CM

## 2015-06-13 LAB — POCT INR: INR: 2.9

## 2015-07-04 ENCOUNTER — Ambulatory Visit (INDEPENDENT_AMBULATORY_CARE_PROVIDER_SITE_OTHER): Payer: Medicare PPO | Admitting: *Deleted

## 2015-07-04 DIAGNOSIS — I4891 Unspecified atrial fibrillation: Secondary | ICD-10-CM | POA: Diagnosis not present

## 2015-07-04 DIAGNOSIS — Z5181 Encounter for therapeutic drug level monitoring: Secondary | ICD-10-CM

## 2015-07-04 LAB — POCT INR: INR: 2.6

## 2015-07-18 ENCOUNTER — Ambulatory Visit (INDEPENDENT_AMBULATORY_CARE_PROVIDER_SITE_OTHER): Payer: Medicare PPO | Admitting: Cardiology

## 2015-07-18 ENCOUNTER — Ambulatory Visit (INDEPENDENT_AMBULATORY_CARE_PROVIDER_SITE_OTHER): Payer: Medicare PPO | Admitting: *Deleted

## 2015-07-18 ENCOUNTER — Encounter: Payer: Self-pay | Admitting: Cardiology

## 2015-07-18 VITALS — BP 132/76 | HR 74 | Ht 67.0 in | Wt 226.6 lb

## 2015-07-18 DIAGNOSIS — I10 Essential (primary) hypertension: Secondary | ICD-10-CM

## 2015-07-18 DIAGNOSIS — I4891 Unspecified atrial fibrillation: Secondary | ICD-10-CM

## 2015-07-18 DIAGNOSIS — R011 Cardiac murmur, unspecified: Secondary | ICD-10-CM

## 2015-07-18 DIAGNOSIS — Z5181 Encounter for therapeutic drug level monitoring: Secondary | ICD-10-CM

## 2015-07-18 LAB — POCT INR: INR: 2.2

## 2015-07-18 LAB — BASIC METABOLIC PANEL
BUN: 25 mg/dL (ref 7–25)
CHLORIDE: 103 mmol/L (ref 98–110)
CO2: 30 mmol/L (ref 20–31)
CREATININE: 1.18 mg/dL — AB (ref 0.60–0.88)
Calcium: 9.5 mg/dL (ref 8.6–10.4)
GLUCOSE: 89 mg/dL (ref 65–99)
Potassium: 4.4 mmol/L (ref 3.5–5.3)
Sodium: 142 mmol/L (ref 135–146)

## 2015-07-18 NOTE — Patient Instructions (Signed)
Medication Instructions:  Your physician recommends that you continue on your current medications as directed. Please refer to the Current Medication list given to you today.   Labwork: TODAY: BMET  Testing/Procedures: Your physician has requested that you have an echocardiogram. Echocardiography is a painless test that uses sound waves to create images of your heart. It provides your doctor with information about the size and shape of your heart and how well your heart's chambers and valves are working. This procedure takes approximately one hour. There are no restrictions for this procedure.  Follow-Up: Your physician wants you to follow-up in: 1 year with Dr. Turner. You will receive a reminder letter in the mail two months in advance. If you don't receive a letter, please call our office to schedule the follow-up appointment.   Any Other Special Instructions Will Be Listed Below (If Applicable).     If you need a refill on your cardiac medications before your next appointment, please call your pharmacy.   

## 2015-07-18 NOTE — Progress Notes (Signed)
Cardiology Office Note   Date:  07/18/2015   ID:  Sandra Merritt, DOB 22-May-1932, MRN [id number]  PCP:  Marjorie Smolder, MD    Chief Complaint  Patient presents with  . Atrial Fibrillation  . Hypertension      History of Present Illness: Sandra Merritt is a 81 y.o. female with a history of chronic atrial fibrillation on systemic anticoagulation and HTN who presents today for followup. She is doing well. She denies any chest pain,palpitations or syncope.  She occasionally gets SOB when she gets excited or walking long distances.  Her PCP took her off lasix due to kidney problems.  She has chronic LE edema controlledd with HTCZ.      Past Medical History  Diagnosis Date  . Hypertension   . Dyslipidemia   . Hypothyroid   . Heart murmur     Systolic heart murmur w mild AI and MR  . Obesity   . Osteoarthritis   . Colon polyps   . Osteopenia   . COPD (chronic obstructive pulmonary disease) ([place])   . Vitamin D deficiency   . OSA (obstructive sleep apnea)     intolerant to CPAP  . CKD (chronic kidney disease), stage III   . LFTs abnormal     History of abnormal LFTs due to Fatty Liver  . Chronic atrial fibrillation (HCC)     Chronic on coumadin    History reviewed. No pertinent past surgical history.   Current Outpatient Prescriptions  Medication Sig Dispense Refill  . acetaminophen (TYLENOL) 500 MG tablet Take 500 mg by mouth as needed.    Marland Kitchen alendronate (FOSAMAX) 70 MG tablet Take 70 mg by mouth once a week.  11  . Calcium Carb-Cholecalciferol (CALCIUM + D3) 600-200 MG-UNIT TABS Take 2 tablets by mouth 2 (two) times daily.    Marland Kitchen FLUZONE HIGH-DOSE 0.5 ML SUSY   0  . glucosamine-chondroitin 500-400 MG tablet Take 1 tablet by mouth 3 (three) times daily.    . hydrochlorothiazide (HYDRODIURIL) 25 MG tablet Take 25 mg by mouth daily.    Marland Kitchen levothyroxine (SYNTHROID, LEVOTHROID) 100 MCG tablet TAKE 1 TABLET EVERY MORNING ON AN EMPTY STOMACH ONCE A DAY  6  .  lovastatin (MEVACOR) 40 MG tablet Take 40 mg by mouth at bedtime.    . metoprolol succinate (TOPROL-XL) 25 MG 24 hr tablet Take 25 mg by mouth daily.    . Multiple Vitamin (MULTIVITAMIN) tablet Take 1 tablet by mouth daily.    . ramipril (ALTACE) 10 MG capsule TAKE ONE CAPSULE BY MOUTH DAILY 30 capsule 11  . warfarin (COUMADIN) 5 MG tablet TAKE AS DIRECTED BY COUMADIN CLINIC 30 tablet 3   No current facility-administered medications for this visit.    Allergies:   Penicillins    Social History:  The patient  reports that she has never smoked. She does not have any smokeless tobacco history on file. She reports that she does not drink alcohol or use illicit drugs.   Family History:  The patient's family history is not on file.    ROS:  Please see the history of present illness.   Otherwise, review of systems are positive for none.   All other systems are reviewed and negative.    PHYSICAL EXAM: VS:  BP 132/76 mmHg  Pulse 74  Ht 5\' 7"  (1.702 m)  Wt 102.785 kg (226 lb 9.6 oz)  BMI 35.48 kg/m2 , BMI Body mass index is 35.48 kg/(m^2). GEN: Well nourished, well developed, in no acute distress HEENT: normal Neck: no JVD, carotid bruits, or masses Cardiac: RRR; no  rubs, or gallops,no edema.  2/6 SM at RUSB to LLSB Respiratory:  clear to auscultation bilaterally, normal work of breathing GI: soft, nontender, nondistended, + BS MS: no deformity or atrophy Skin: warm and dry, no rash Neuro:  Strength and sensation are intact Psych: euthymic mood, full affect   EKG:  EKG is ordered today. The ekg ordered today demonstrates atrial fibrillation with occasional PVC's   Recent Labs: No results found for requested labs within last 365 days.    Lipid Panel No results found for: CHOL, TRIG, HDL, CHOLHDL, VLDL, LDLCALC, LDLDIRECT    Wt Readings from Last 3 Encounters:  07/18/15 102.785 kg (226 lb 9.6 oz)  07/17/14 105.235 kg (232 lb)  09/15/13 107.412 kg (236 lb 12.8 oz)      ASSESSMENT AND PLAN:  1. Chronic atrial fibrillation rate controlled - continue Metoprolol/Warfarin 2. HTN well controlled - continue HCTZ/metoprolol and ramipril   3.  Heart murmur - check 2D echo     Current medicines are reviewed at length with the patient today.  The patient does not have concerns regarding medicines.  The following changes have been made:  no change  Labs/ tests ordered today: See above Assessment and Plan No orders of the defined types were placed in this encounter.     Disposition:   FU with me in 1 year  Signed, Sueanne Margarita, MD  07/18/2015 8:03 AM    [facility] [place], [place], [place]  [address] Phone: [phone]; Fax: [phone]

## 2015-07-30 ENCOUNTER — Ambulatory Visit (HOSPITAL_COMMUNITY): Payer: Medicare PPO | Attending: Internal Medicine

## 2015-07-30 ENCOUNTER — Telehealth: Payer: Self-pay

## 2015-07-30 ENCOUNTER — Other Ambulatory Visit: Payer: Self-pay

## 2015-07-30 DIAGNOSIS — I340 Nonrheumatic mitral (valve) insufficiency: Secondary | ICD-10-CM | POA: Diagnosis not present

## 2015-07-30 DIAGNOSIS — I129 Hypertensive chronic kidney disease with stage 1 through stage 4 chronic kidney disease, or unspecified chronic kidney disease: Secondary | ICD-10-CM | POA: Insufficient documentation

## 2015-07-30 DIAGNOSIS — R011 Cardiac murmur, unspecified: Secondary | ICD-10-CM | POA: Diagnosis not present

## 2015-07-30 DIAGNOSIS — I071 Rheumatic tricuspid insufficiency: Secondary | ICD-10-CM | POA: Insufficient documentation

## 2015-07-30 DIAGNOSIS — I517 Cardiomegaly: Secondary | ICD-10-CM | POA: Diagnosis not present

## 2015-07-30 DIAGNOSIS — N189 Chronic kidney disease, unspecified: Secondary | ICD-10-CM | POA: Insufficient documentation

## 2015-07-30 DIAGNOSIS — I352 Nonrheumatic aortic (valve) stenosis with insufficiency: Secondary | ICD-10-CM | POA: Insufficient documentation

## 2015-07-30 DIAGNOSIS — E785 Hyperlipidemia, unspecified: Secondary | ICD-10-CM | POA: Diagnosis not present

## 2015-07-30 DIAGNOSIS — I272 Pulmonary hypertension, unspecified: Secondary | ICD-10-CM

## 2015-07-30 DIAGNOSIS — I5189 Other ill-defined heart diseases: Secondary | ICD-10-CM

## 2015-07-30 NOTE — Telephone Encounter (Signed)
-----   Message from Sueanne Margarita, MD sent at 07/30/2015  3:32 PM EST ----- Moderate pulmonary HTN most likely a complication of untreated OSA, COPD and diastolic dysfunction as well as MR.  Please check a BNP

## 2015-07-30 NOTE — Telephone Encounter (Signed)
Informed patient of results and verbal understanding expressed.   Patient to have BNP drawn when she comes for Coumadin Clinic appointment 12/14.

## 2015-08-15 ENCOUNTER — Other Ambulatory Visit (INDEPENDENT_AMBULATORY_CARE_PROVIDER_SITE_OTHER): Payer: Medicare PPO | Admitting: *Deleted

## 2015-08-15 ENCOUNTER — Ambulatory Visit (INDEPENDENT_AMBULATORY_CARE_PROVIDER_SITE_OTHER): Payer: Medicare PPO | Admitting: *Deleted

## 2015-08-15 DIAGNOSIS — Z5181 Encounter for therapeutic drug level monitoring: Secondary | ICD-10-CM | POA: Diagnosis not present

## 2015-08-15 DIAGNOSIS — I5189 Other ill-defined heart diseases: Secondary | ICD-10-CM

## 2015-08-15 DIAGNOSIS — I272 Other secondary pulmonary hypertension: Secondary | ICD-10-CM | POA: Diagnosis not present

## 2015-08-15 DIAGNOSIS — I4891 Unspecified atrial fibrillation: Secondary | ICD-10-CM

## 2015-08-15 LAB — BRAIN NATRIURETIC PEPTIDE: BRAIN NATRIURETIC PEPTIDE: 227.5 pg/mL — AB (ref 0.0–100.0)

## 2015-08-15 LAB — POCT INR: INR: 2.4

## 2015-08-17 ENCOUNTER — Telehealth: Payer: Self-pay

## 2015-08-17 DIAGNOSIS — I272 Pulmonary hypertension, unspecified: Secondary | ICD-10-CM

## 2015-08-17 DIAGNOSIS — I4891 Unspecified atrial fibrillation: Secondary | ICD-10-CM

## 2015-08-17 DIAGNOSIS — I10 Essential (primary) hypertension: Secondary | ICD-10-CM

## 2015-08-17 MED ORDER — FUROSEMIDE 20 MG PO TABS: 20.0000 mg | ORAL_TABLET | Freq: Every day | ORAL | Status: DC

## 2015-08-17 NOTE — Telephone Encounter (Signed)
-----   Message from Sueanne Margarita, MD sent at 08/16/2015  9:12 PM EST ----- BNP elevated and moderate pulmonary HTN on echo probably with a component of diastolic CHF - stop HCTZ and start lasix 20mg  BID for 2 days then 20mg  daily.  Repeat BNP and BMET in 1 week

## 2015-08-17 NOTE — Telephone Encounter (Signed)
Informed patient of results and verbal understanding expressed.  Instructed patient to STOP HCTZ and START LASIX 20 mg daily. Patient understands to take Lasix 20 mg BID for first 2 days. BNP and BMET scheduled for 08/28/15. Patient agrees with treatment plan.

## 2015-08-22 ENCOUNTER — Other Ambulatory Visit: Payer: Self-pay

## 2015-08-22 DIAGNOSIS — Z1231 Encounter for screening mammogram for malignant neoplasm of breast: Secondary | ICD-10-CM

## 2015-08-27 ENCOUNTER — Other Ambulatory Visit: Payer: Self-pay | Admitting: Cardiology

## 2015-08-28 ENCOUNTER — Other Ambulatory Visit (INDEPENDENT_AMBULATORY_CARE_PROVIDER_SITE_OTHER): Payer: Medicare PPO | Admitting: *Deleted

## 2015-08-28 DIAGNOSIS — I4891 Unspecified atrial fibrillation: Secondary | ICD-10-CM

## 2015-08-28 DIAGNOSIS — I10 Essential (primary) hypertension: Secondary | ICD-10-CM | POA: Diagnosis not present

## 2015-08-28 DIAGNOSIS — I272 Other secondary pulmonary hypertension: Secondary | ICD-10-CM | POA: Diagnosis not present

## 2015-08-28 LAB — BRAIN NATRIURETIC PEPTIDE: Brain Natriuretic Peptide: 271.4 pg/mL — ABNORMAL HIGH (ref 0.0–100.0)

## 2015-08-28 LAB — BASIC METABOLIC PANEL
BUN: 21 mg/dL (ref 7–25)
CHLORIDE: 102 mmol/L (ref 98–110)
CO2: 29 mmol/L (ref 20–31)
Calcium: 9.5 mg/dL (ref 8.6–10.4)
Creat: 1.14 mg/dL — ABNORMAL HIGH (ref 0.60–0.88)
GLUCOSE: 85 mg/dL (ref 65–99)
POTASSIUM: 4.2 mmol/L (ref 3.5–5.3)
SODIUM: 139 mmol/L (ref 135–146)

## 2015-08-28 NOTE — Addendum Note (Signed)
Addended by: Eulis Foster on: 08/28/2015 08:01 AM   Modules accepted: Orders

## 2015-08-29 ENCOUNTER — Telehealth: Payer: Self-pay

## 2015-08-29 DIAGNOSIS — I10 Essential (primary) hypertension: Secondary | ICD-10-CM

## 2015-08-29 DIAGNOSIS — I4891 Unspecified atrial fibrillation: Secondary | ICD-10-CM

## 2015-08-29 NOTE — Telephone Encounter (Signed)
-----   Message from Sueanne Margarita, MD sent at 08/29/2015 12:21 AM EST ----- BNP elevated secondary to diastolic dysfunction.  This is most likely contributing to pulmonary HTN with pulmonary venous HTN. D/C HCTZ and start lasix 20mg  2 tablets daily for 2 days then 1 tablet daily and check BMET in 1 week.  Increase potassium rich foods in her diet.

## 2015-08-29 NOTE — Telephone Encounter (Signed)
Patient stopped HCTZ and started lasix 12/16.  Instructed patient to increase lasix to 2 tablets daily for two days and to increase K in diet. Patient st she will have her lab work drawn at her next Coumadin appointment 1/11. She understands to weigh daily and to call if she gains 3 pounds in 1 day or 5 pounds in 3 days.

## 2015-09-12 ENCOUNTER — Other Ambulatory Visit: Payer: Medicare PPO

## 2015-09-18 ENCOUNTER — Ambulatory Visit (INDEPENDENT_AMBULATORY_CARE_PROVIDER_SITE_OTHER): Payer: Medicare PPO

## 2015-09-18 ENCOUNTER — Other Ambulatory Visit (INDEPENDENT_AMBULATORY_CARE_PROVIDER_SITE_OTHER): Payer: Medicare PPO | Admitting: *Deleted

## 2015-09-18 DIAGNOSIS — Z5181 Encounter for therapeutic drug level monitoring: Secondary | ICD-10-CM | POA: Diagnosis not present

## 2015-09-18 DIAGNOSIS — I10 Essential (primary) hypertension: Secondary | ICD-10-CM | POA: Diagnosis not present

## 2015-09-18 DIAGNOSIS — I4891 Unspecified atrial fibrillation: Secondary | ICD-10-CM

## 2015-09-18 LAB — BASIC METABOLIC PANEL
BUN: 18 mg/dL (ref 7–25)
CALCIUM: 9.4 mg/dL (ref 8.6–10.4)
CHLORIDE: 103 mmol/L (ref 98–110)
CO2: 25 mmol/L (ref 20–31)
CREATININE: 1.12 mg/dL — AB (ref 0.60–0.88)
Glucose, Bld: 84 mg/dL (ref 65–99)
Potassium: 4.5 mmol/L (ref 3.5–5.3)
Sodium: 138 mmol/L (ref 135–146)

## 2015-09-18 LAB — POCT INR: INR: 2.7

## 2015-10-02 ENCOUNTER — Ambulatory Visit
Admission: RE | Admit: 2015-10-02 | Discharge: 2015-10-02 | Disposition: A | Discharge status: Home / Self Care | Payer: Medicare PPO | Source: Ambulatory Visit

## 2015-10-02 DIAGNOSIS — Z1231 Encounter for screening mammogram for malignant neoplasm of breast: Secondary | ICD-10-CM

## 2015-10-08 ENCOUNTER — Other Ambulatory Visit: Payer: Self-pay | Admitting: Cardiology

## 2015-10-16 ENCOUNTER — Ambulatory Visit (INDEPENDENT_AMBULATORY_CARE_PROVIDER_SITE_OTHER): Payer: Medicare PPO | Admitting: *Deleted

## 2015-10-16 DIAGNOSIS — I4891 Unspecified atrial fibrillation: Secondary | ICD-10-CM

## 2015-10-16 DIAGNOSIS — Z5181 Encounter for therapeutic drug level monitoring: Secondary | ICD-10-CM

## 2015-10-16 LAB — POCT INR: INR: 3.1

## 2015-11-13 ENCOUNTER — Ambulatory Visit (INDEPENDENT_AMBULATORY_CARE_PROVIDER_SITE_OTHER): Payer: Medicare PPO

## 2015-11-13 DIAGNOSIS — Z5181 Encounter for therapeutic drug level monitoring: Secondary | ICD-10-CM

## 2015-11-13 DIAGNOSIS — I4891 Unspecified atrial fibrillation: Secondary | ICD-10-CM

## 2015-11-13 LAB — POCT INR: INR: 3

## 2015-12-11 ENCOUNTER — Ambulatory Visit (INDEPENDENT_AMBULATORY_CARE_PROVIDER_SITE_OTHER): Payer: Medicare PPO | Admitting: *Deleted

## 2015-12-11 DIAGNOSIS — I4891 Unspecified atrial fibrillation: Secondary | ICD-10-CM

## 2015-12-11 DIAGNOSIS — Z5181 Encounter for therapeutic drug level monitoring: Secondary | ICD-10-CM

## 2015-12-11 LAB — POCT INR: INR: 2.3

## 2015-12-12 IMAGING — MG MM DIGITAL SCREENING BILAT W/ CAD
8 series · 8 of 8 positions shown · non-contrast
Comparison: Previous exam(s).

CLINICAL DATA: Screening.

EXAM:
DIGITAL SCREENING BILATERAL MAMMOGRAM WITH CAD

[R CC]
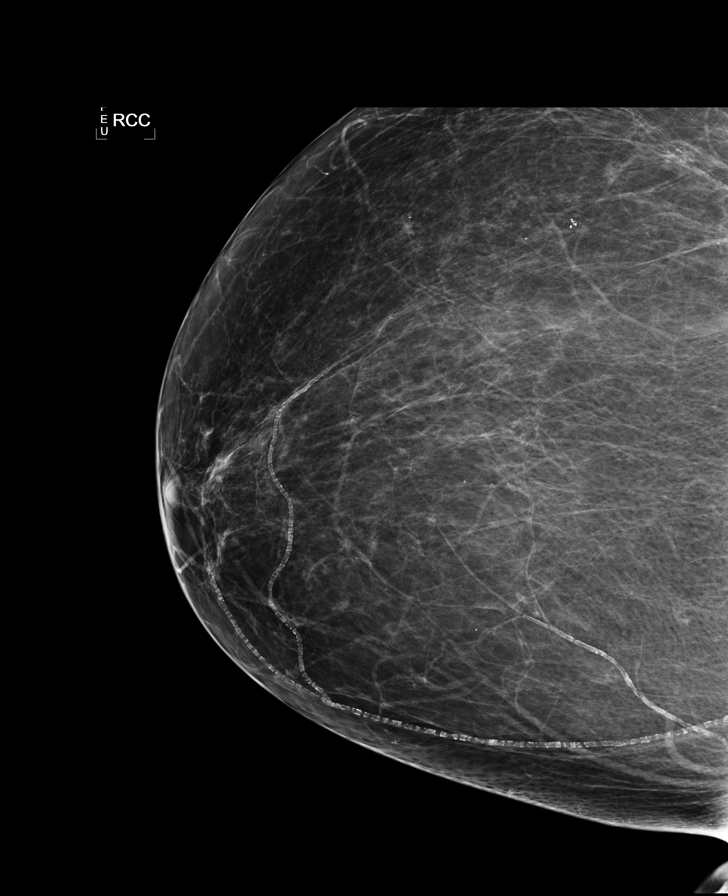

[L CC (1 of 2)]
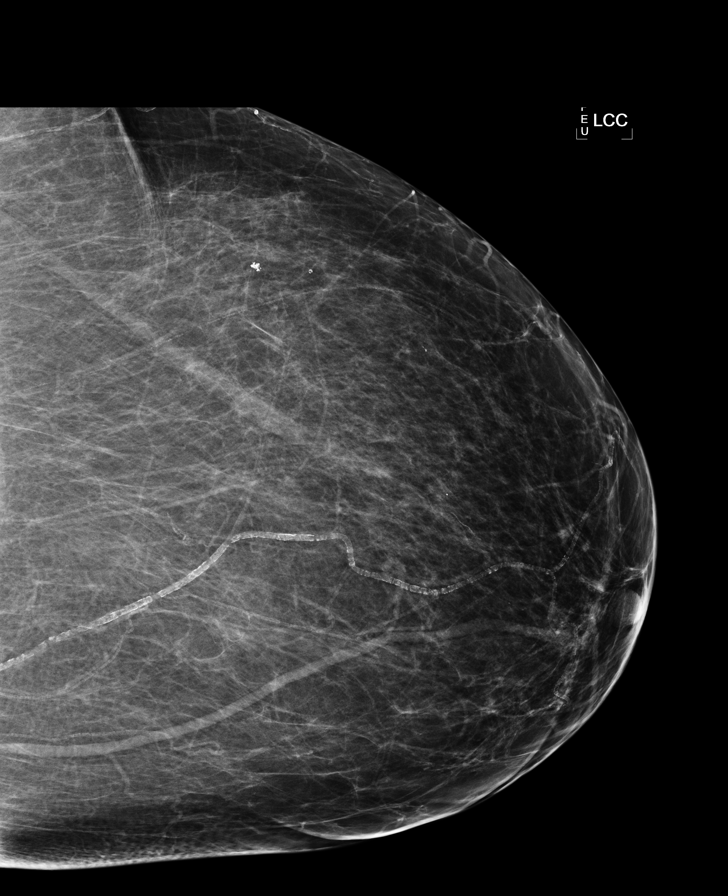

[L MLO (1 of 3)]
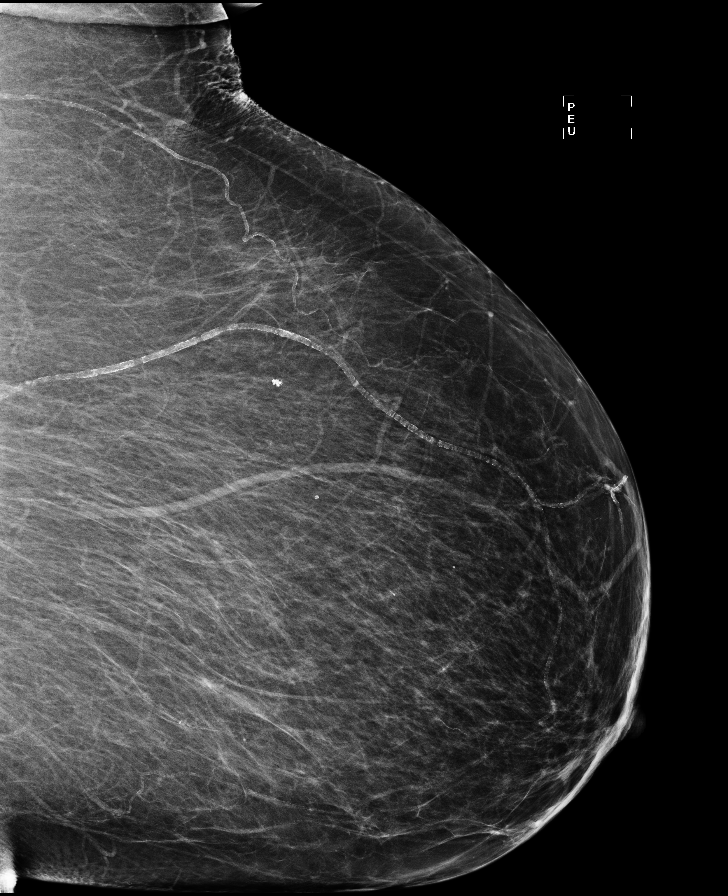

[R MLO (1 of 2)]
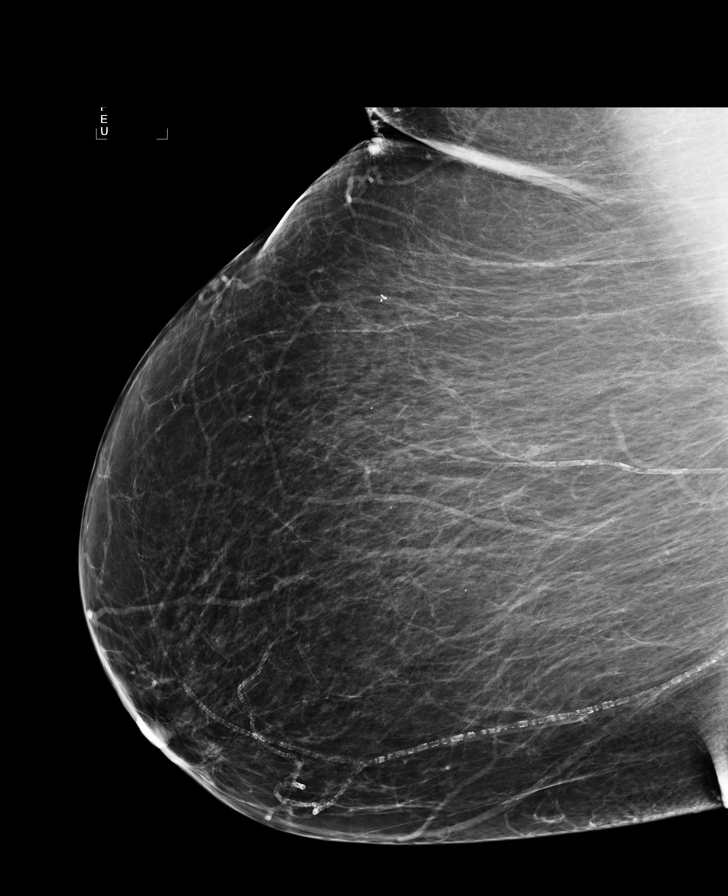

[R MLO (2 of 2)]
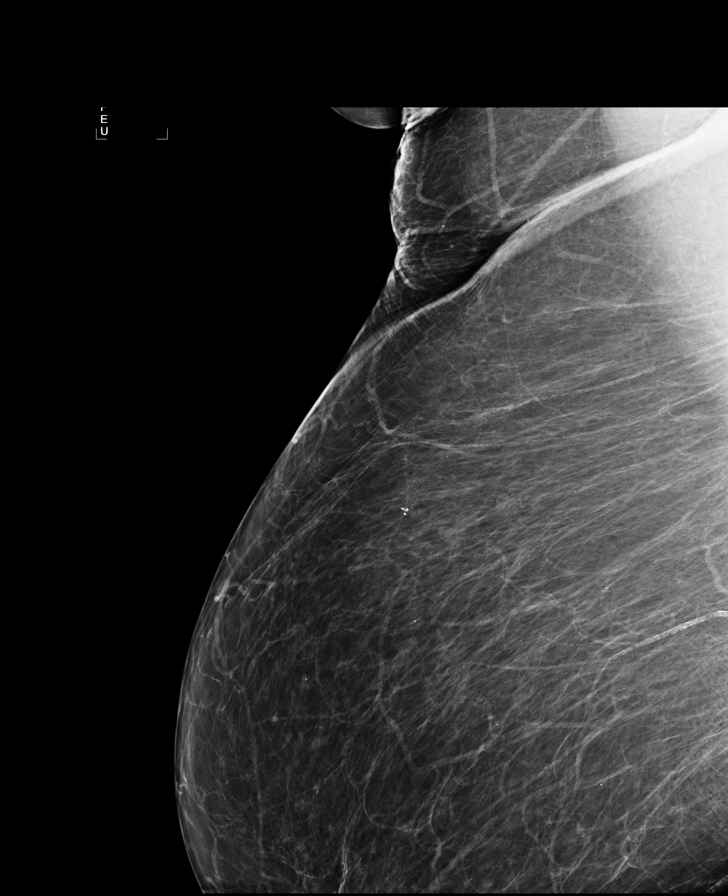

[L MLO (2 of 3)]
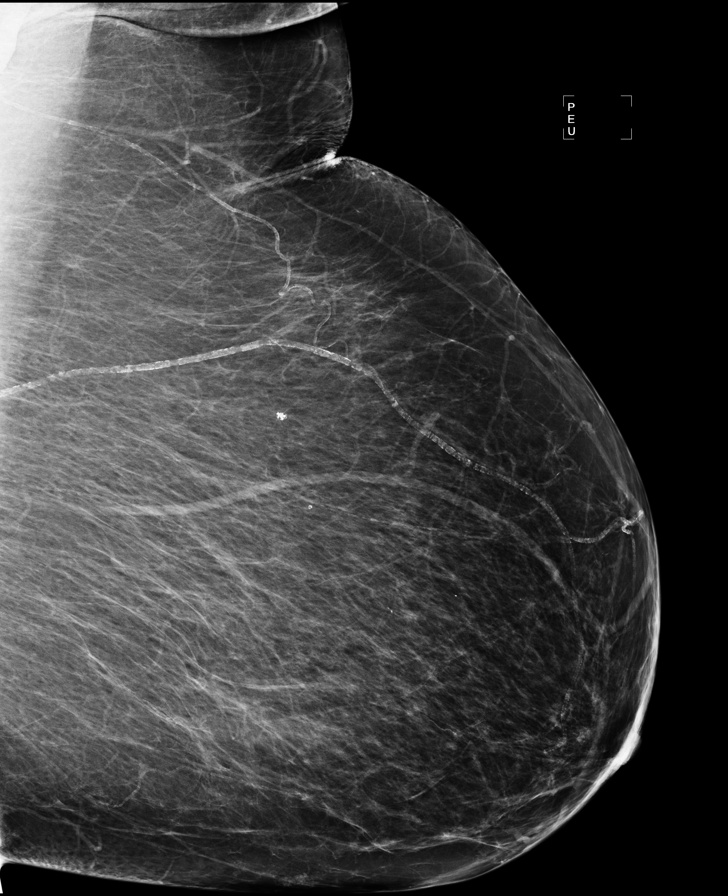

[L CC (2 of 2)]
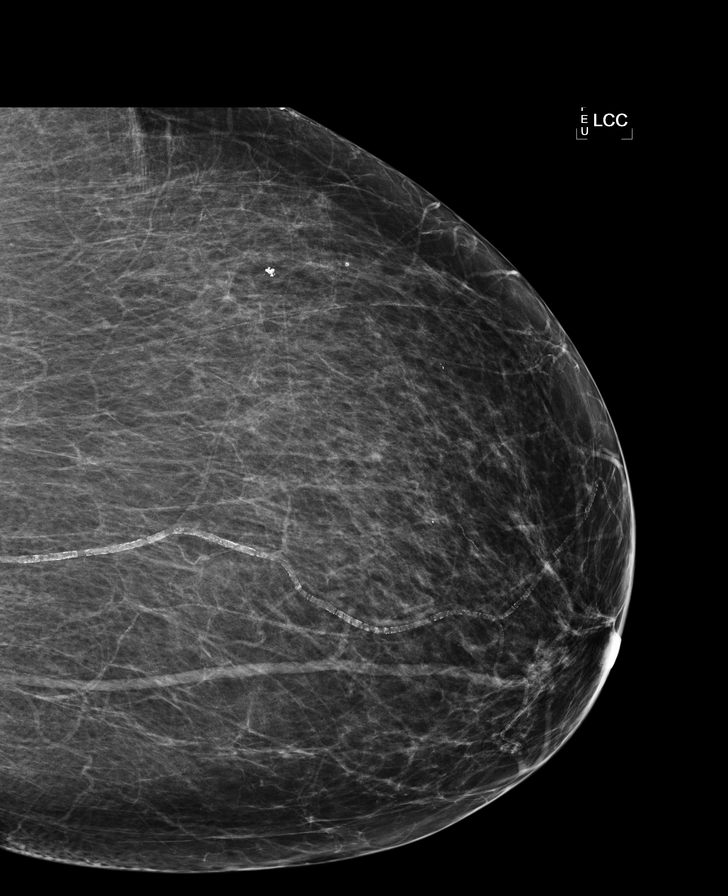

[L MLO (3 of 3)]
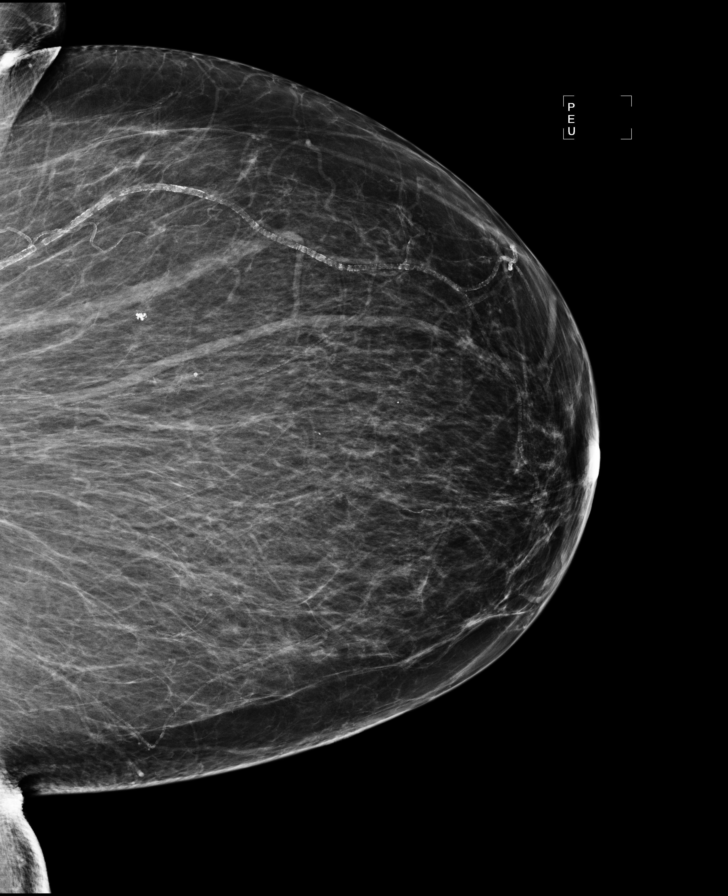

[8 of 8 positions shown; findings below may reference images not displayed]

ACR Breast Density Category b: There are scattered areas of
fibroglandular density.
FINDINGS: There are no findings suspicious for malignancy. Images were
processed with CAD.
IMPRESSION: No mammographic evidence of malignancy. A result letter of this
screening mammogram will be mailed directly to the patient.

RECOMMENDATION:
Screening mammogram in one year. (Code:AS-G-LCT)

BI-RADS CATEGORY  1: Negative.

## 2015-12-21 ENCOUNTER — Other Ambulatory Visit: Payer: Self-pay

## 2015-12-21 MED ORDER — FUROSEMIDE 20 MG PO TABS: 20.0000 mg | ORAL_TABLET | Freq: Every day | ORAL | Status: DC

## 2015-12-21 MED ORDER — RAMIPRIL 10 MG PO CAPS: 10.0000 mg | ORAL_CAPSULE | Freq: Every day | ORAL | Status: DC

## 2016-01-15 ENCOUNTER — Ambulatory Visit (INDEPENDENT_AMBULATORY_CARE_PROVIDER_SITE_OTHER): Payer: Medicare PPO | Admitting: *Deleted

## 2016-01-15 DIAGNOSIS — Z5181 Encounter for therapeutic drug level monitoring: Secondary | ICD-10-CM

## 2016-01-15 DIAGNOSIS — I4891 Unspecified atrial fibrillation: Secondary | ICD-10-CM

## 2016-01-15 LAB — POCT INR: INR: 2.7

## 2016-02-19 ENCOUNTER — Other Ambulatory Visit: Payer: Self-pay | Admitting: Cardiology

## 2016-02-19 ENCOUNTER — Telehealth: Payer: Self-pay | Admitting: Cardiology

## 2016-02-19 NOTE — Telephone Encounter (Signed)
Returned patient's call. She is having a small bump on her eyelid removed on 6/27 with Dr. Clent Jacks. She is asking how long she needs to hold her Coumadin. She saw optometrist today and he did not mention holding the Coumadin at all.  Called Dr. Zenia Resides office - they confirmed pt does not need to hold her Coumadin for procedure.  Called pt back to inform her. She verbalized understanding.

## 2016-02-19 NOTE — Telephone Encounter (Signed)
[facility]  Pt requested to speak w/ coum about an upcoming procedure- pt is having a "bump removed from eyelid" wanted to discuss possibility of holding coumadin. Please call back and discuss.

## 2016-02-26 ENCOUNTER — Other Ambulatory Visit: Payer: Self-pay | Admitting: Ophthalmology

## 2016-02-26 ENCOUNTER — Ambulatory Visit (INDEPENDENT_AMBULATORY_CARE_PROVIDER_SITE_OTHER): Payer: Medicare PPO | Admitting: *Deleted

## 2016-02-26 DIAGNOSIS — Z5181 Encounter for therapeutic drug level monitoring: Secondary | ICD-10-CM | POA: Diagnosis not present

## 2016-02-26 DIAGNOSIS — I4891 Unspecified atrial fibrillation: Secondary | ICD-10-CM | POA: Diagnosis not present

## 2016-02-26 LAB — POCT INR: INR: 2.7

## 2016-04-08 ENCOUNTER — Ambulatory Visit (INDEPENDENT_AMBULATORY_CARE_PROVIDER_SITE_OTHER): Payer: Medicare PPO | Admitting: *Deleted

## 2016-04-08 ENCOUNTER — Encounter (INDEPENDENT_AMBULATORY_CARE_PROVIDER_SITE_OTHER): Payer: Self-pay

## 2016-04-08 DIAGNOSIS — I4891 Unspecified atrial fibrillation: Secondary | ICD-10-CM | POA: Diagnosis not present

## 2016-04-08 DIAGNOSIS — Z5181 Encounter for therapeutic drug level monitoring: Secondary | ICD-10-CM | POA: Diagnosis not present

## 2016-04-08 LAB — POCT INR: INR: 2.2

## 2016-05-19 ENCOUNTER — Encounter: Payer: Self-pay | Admitting: Neurology

## 2016-05-19 ENCOUNTER — Ambulatory Visit (INDEPENDENT_AMBULATORY_CARE_PROVIDER_SITE_OTHER): Payer: Medicare PPO | Admitting: Neurology

## 2016-05-19 VITALS — BP 144/69 | HR 86 | Ht 67.0 in | Wt 229.5 lb

## 2016-05-19 DIAGNOSIS — R269 Unspecified abnormalities of gait and mobility: Secondary | ICD-10-CM | POA: Diagnosis not present

## 2016-05-19 DIAGNOSIS — G500 Trigeminal neuralgia: Secondary | ICD-10-CM

## 2016-05-19 DIAGNOSIS — G3184 Mild cognitive impairment, so stated: Secondary | ICD-10-CM

## 2016-05-19 DIAGNOSIS — G4730 Sleep apnea, unspecified: Secondary | ICD-10-CM

## 2016-05-19 MED ORDER — ALPRAZOLAM 0.5 MG PO TABS: ORAL_TABLET | ORAL | 0 refills | Status: DC

## 2016-05-19 MED ORDER — GABAPENTIN 300 MG PO CAPS: ORAL_CAPSULE | ORAL | 11 refills | Status: DC

## 2016-05-19 NOTE — Progress Notes (Signed)
PATIENT: Sandra Merritt DOB: July 27, 1932  Chief Complaint  Patient presents with  . Trigeminal Neuralgia    She is here with her daughter, [name] and her friend, [name].  She has intermittent right-sided facial pain that has been present for several years.  . Mild memory problems    MMSE 30/30 - 13 animals.  She is concerned about her worsening forgetfulness.     HISTORICAL  Sandra Merritt is 82 years old left-handed female accompanied by her daughter [name], and friend [name], seen in refer by her primary care physician Dr. Darcus Austin for evaluation of intermittent right facial pain and the memory loss on May 19 2016  I reviewed and summarized the referring note, she had a history of hypertension, chronic atrial fibrillation, on chronic Coumadin, hypertension, hyperlipidemia, hypothyroidism on supplement since 2005, COPD, moderate pulmonary hypertension, mild aortic stenosis, aortic regurgitation and moderate mitral valve regurgitation, osteopenia, vitamin D deficiency, obstructive sleep apnea, but took could not tolerate CPAP machine, chronic kidney disease, stage III, mild abnormal liver functional test from fatty liver  She graduated from high school, 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 Never tried any neuropathic medications in the past.  Dementia: She was noted to have memory loss since 2016, need helping with her bills, still drives short distance, relies on GPS, she sleeps well at night, but still fatigue, sleepy at  day during the day, take frequent short daytime nap, previously had abnormal sleep study, was given CPAP, but could not tolerate it  Most recent laboratory evaluation in April 18, 2016, normal CMP, with creatinine 1.09, TSH was elevated at 20 2.3,, her thyroid supplement was increased to 112 g, previous TSH was normal in February 20 second 2017 3.9, cholesterol 145, LDL 64,  I personally reviewed CT head without in Nov 2015, mild generalized atrophy.   REVIEW OF SYSTEMS: Full 14 system review of systems performed and notable only for anxiety, decreased energy, sleepiness, snoring, restless leg, memory loss, headaches, numbness, weakness, dizziness, feeling cold, increased thirst, joint pain, joint swelling, achy muscles, runny nose, cough, snoring, incontinence, constipation, diarrhea, urination problems, incontinence, rash, swelling legs, hearing loss  ALLERGIES: Allergies  Allergen Reactions  . Penicillins Hives and Rash    HOME MEDICATIONS: Current Outpatient Prescriptions  Medication Sig Dispense Refill  . acetaminophen (TYLENOL) 500 MG tablet Take 500 mg by mouth as needed.    Marland Kitchen alendronate (FOSAMAX) 70 MG tablet Take 70 mg by mouth once a week.  11  . Calcium Carb-Cholecalciferol (CALCIUM + D3) 600-200 MG-UNIT TABS Take 2 tablets by mouth 2 (two) times daily.    Marland Kitchen FLUZONE HIGH-DOSE 0.5 ML SUSY   0  . furosemide (LASIX) 20 MG tablet Take 1 tablet (20 mg total) by mouth daily. 90 tablet 1  . glucosamine-chondroitin 500-400 MG tablet Take 1 tablet by mouth 3 (three) times daily.    Marland Kitchen levothyroxine (SYNTHROID, LEVOTHROID) 100 MCG tablet TAKE 1 TABLET EVERY MORNING ON AN EMPTY STOMACH ONCE A DAY  6  . lovastatin (MEVACOR) 40 MG  tablet Take 40 mg by mouth at bedtime.    . metoprolol succinate (TOPROL-XL) 25 MG 24 hr tablet Take 25 mg by mouth daily.    . Multiple Vitamin (MULTIVITAMIN) tablet Take 1 tablet by mouth daily.    . Multiple Vitamins-Minerals (EYE VITAMINS PO) Take by mouth 2  (two) times daily.    . ramipril (ALTACE) 10 MG capsule Take 1 capsule (10 mg total) by mouth daily. 90 capsule 1  . warfarin (COUMADIN) 5 MG tablet TAKE AS DIRECTED BY COUMADIN CLINIC 30 tablet 3   No current facility-administered medications for this visit.     PAST MEDICAL HISTORY: Past Medical History:  Diagnosis Date  . Chronic atrial fibrillation (HCC)    Chronic on coumadin  . CKD (chronic kidney disease), stage III   . Colon polyps   . COPD (chronic obstructive pulmonary disease) ([place])   . Dyslipidemia   . Heart murmur    Systolic heart murmur w mild AI and MR  . Hypertension   . Hypothyroid   . LFTs abnormal    History of abnormal LFTs due to Fatty Liver  . Macular degeneration   . Memory loss   . Obesity   . OSA (obstructive sleep apnea)    intolerant to CPAP  . Osteoarthritis   . Osteopenia   . Right sided facial pain   . Vitamin D deficiency     PAST SURGICAL HISTORY: Past Surgical History:  Procedure Laterality Date  . CHOLECYSTECTOMY    . KNEE SURGERY Right     FAMILY HISTORY: Family History  Problem Relation Age of Onset  . Heart disease Mother   . Heart disease Father   . Heart disease Sister   . Diabetes Sister     SOCIAL HISTORY:  Social History   Social History  . Marital status: Married    Spouse name: N/A  . Number of children: 3  . Years of education: HS   Occupational History  . Not on file.   Social History Main Topics  . Smoking status: Never Smoker  . Smokeless tobacco: Never Used  . Alcohol use No  . Drug use: No  . Sexual activity: Not on file   Other Topics Concern  . Not on file   Social History Narrative   Lives at home with two daughters and her friend.   Left-handed.   No caffeine use.     PHYSICAL EXAM   Vitals:   05/19/16 1036  BP: (!) 144/69  Pulse: 86  Weight: 229 lb 8 oz (104.1 kg)  Height: 5\' 7"  (1.702 m)    Not recorded      Body mass index is 35.94 kg/m.  PHYSICAL  EXAMNIATION:  Gen: NAD, conversant, well nourised, obese, well groomed                     Cardiovascular: Regular rate rhythm, no peripheral edema, warm, nontender. Eyes: Conjunctivae clear without exudates or hemorrhage Neck: Supple, no carotid bruise. Pulmonary: Clear to auscultation bilaterally   NEUROLOGICAL EXAM:  MENTAL STATUS: Speech:    Speech is normal; fluent and spontaneous with normal comprehension.  Cognition:Mini-Mental Status Examination 30/30, animal naming 13     Orientation to time, place and person     Normal recent and remote memory     Normal Attention span and concentration     Normal Language, naming, repeating,spontaneous speech     Fund of knowledge   CRANIAL NERVES: CN II:  Visual fields are full to confrontation. Fundoscopic exam is normal with sharp discs and no vascular changes. Pupils are round equal and briskly reactive to light. CN III, IV, VI: extraocular movement are normal. No ptosis. CN V: Facial sensation is intact to pinprick in all 3 divisions bilaterally. Corneal responses are intact.  CN VII: Face is symmetric with normal eye closure and smile. CN VIII: Hearing is normal to rubbing fingers CN IX, X: Palate elevates symmetrically. Phonation is normal. CN XI: Head turning and shoulder shrug are intact CN XII: Tongue is midline with normal movements and no atrophy.  MOTOR: There is no pronator drift of out-stretched arms. Muscle bulk and tone are normal. Muscle strength is normal.  REFLEXES: Reflexes are 1 and symmetric at the biceps, triceps, knees, and ankles. Plantar responses are flexor.  SENSORY: Intact to light touch, pinprick, positional sensation and vibratory sensation are intact in fingers and toes.  COORDINATION: Rapid alternating movements and fine finger movements are intact. There is no dysmetria on finger-to-nose and heel-knee-shin.    GAIT/STANCE: Mild antalgic, cautious gait   DIAGNOSTIC DATA (LABS, IMAGING,  TESTING) - I reviewed patient records, labs, notes, testing and imaging myself where available.   ASSESSMENT AND PLAN  Sandra Merritt is a 82 y.o. female   Mild cognitive impairment Trigeminal neuralgia  MRI of the brain without contrast, she has mild elevated creatinine GFR 48  ,Gabapentin 300 mg 3 times a day  Obstructive sleep apnea  ESS score is 13,  Refer her to sleep study  Marcial Pacas, M.D. Ph.D.  [facility] [address] [place], [place] [address] Ph: [phone] Fax: [phone]  CC: Darcus Austin, MD

## 2016-05-20 ENCOUNTER — Ambulatory Visit (INDEPENDENT_AMBULATORY_CARE_PROVIDER_SITE_OTHER): Payer: Medicare PPO | Admitting: *Deleted

## 2016-05-20 DIAGNOSIS — I4891 Unspecified atrial fibrillation: Secondary | ICD-10-CM | POA: Diagnosis not present

## 2016-05-20 DIAGNOSIS — Z5181 Encounter for therapeutic drug level monitoring: Secondary | ICD-10-CM | POA: Diagnosis not present

## 2016-05-20 LAB — POCT INR: INR: 1.4

## 2016-05-27 ENCOUNTER — Ambulatory Visit (INDEPENDENT_AMBULATORY_CARE_PROVIDER_SITE_OTHER): Payer: Medicare PPO | Admitting: *Deleted

## 2016-05-27 DIAGNOSIS — Z5181 Encounter for therapeutic drug level monitoring: Secondary | ICD-10-CM

## 2016-05-27 DIAGNOSIS — I4891 Unspecified atrial fibrillation: Secondary | ICD-10-CM

## 2016-05-27 LAB — POCT INR: INR: 2.1

## 2016-06-11 ENCOUNTER — Ambulatory Visit (INDEPENDENT_AMBULATORY_CARE_PROVIDER_SITE_OTHER): Payer: Medicare PPO | Admitting: Pharmacist

## 2016-06-11 DIAGNOSIS — I4891 Unspecified atrial fibrillation: Secondary | ICD-10-CM

## 2016-06-11 DIAGNOSIS — Z5181 Encounter for therapeutic drug level monitoring: Secondary | ICD-10-CM | POA: Diagnosis not present

## 2016-06-11 LAB — POCT INR: INR: 1.8

## 2016-06-13 ENCOUNTER — Ambulatory Visit
Admission: RE | Admit: 2016-06-13 | Discharge: 2016-06-13 | Disposition: A | Discharge status: Home / Self Care | Payer: Medicare PPO | Source: Ambulatory Visit | Attending: Neurology | Admitting: Neurology

## 2016-06-13 DIAGNOSIS — G500 Trigeminal neuralgia: Secondary | ICD-10-CM

## 2016-06-13 DIAGNOSIS — G4730 Sleep apnea, unspecified: Secondary | ICD-10-CM

## 2016-06-13 DIAGNOSIS — R269 Unspecified abnormalities of gait and mobility: Secondary | ICD-10-CM

## 2016-06-13 DIAGNOSIS — G3184 Mild cognitive impairment, so stated: Secondary | ICD-10-CM

## 2016-06-20 ENCOUNTER — Other Ambulatory Visit: Payer: Self-pay | Admitting: Cardiology

## 2016-06-23 ENCOUNTER — Other Ambulatory Visit: Payer: Self-pay | Admitting: Cardiology

## 2016-06-25 ENCOUNTER — Other Ambulatory Visit: Payer: Self-pay | Admitting: Cardiology

## 2016-06-25 ENCOUNTER — Telehealth: Payer: Self-pay | Admitting: Cardiology

## 2016-06-25 MED ORDER — RAMIPRIL 10 MG PO CAPS: 10.0000 mg | ORAL_CAPSULE | Freq: Every day | ORAL | 0 refills | Status: DC

## 2016-06-25 MED ORDER — WARFARIN SODIUM 5 MG PO TABS: 5.0000 mg | ORAL_TABLET | ORAL | 0 refills | Status: DC

## 2016-06-25 MED ORDER — FUROSEMIDE 20 MG PO TABS: 20.0000 mg | ORAL_TABLET | Freq: Every day | ORAL | 0 refills | Status: DC

## 2016-06-25 NOTE — Telephone Encounter (Signed)
Pt requesting a Warfarin refill be sent to [facility]. Please advise

## 2016-06-25 NOTE — Telephone Encounter (Signed)
[facility] pharmacy has been added to pt's preferred pharmacy list.

## 2016-06-25 NOTE — Telephone Encounter (Signed)
FYI: Mrs. Pannier is changing her pharmacy to [facility] and they will be sending a fax inference to this .

## 2016-06-27 ENCOUNTER — Ambulatory Visit (INDEPENDENT_AMBULATORY_CARE_PROVIDER_SITE_OTHER): Payer: Medicare PPO | Admitting: *Deleted

## 2016-06-27 DIAGNOSIS — Z5181 Encounter for therapeutic drug level monitoring: Secondary | ICD-10-CM | POA: Diagnosis not present

## 2016-06-27 DIAGNOSIS — I4891 Unspecified atrial fibrillation: Secondary | ICD-10-CM

## 2016-06-27 LAB — POCT INR: INR: 1.6

## 2016-07-11 ENCOUNTER — Ambulatory Visit (INDEPENDENT_AMBULATORY_CARE_PROVIDER_SITE_OTHER): Payer: Medicare PPO | Admitting: *Deleted

## 2016-07-11 DIAGNOSIS — I4891 Unspecified atrial fibrillation: Secondary | ICD-10-CM

## 2016-07-11 DIAGNOSIS — Z5181 Encounter for therapeutic drug level monitoring: Secondary | ICD-10-CM | POA: Diagnosis not present

## 2016-07-11 LAB — POCT INR: INR: 2.4

## 2016-07-28 ENCOUNTER — Ambulatory Visit: Payer: Medicare PPO | Admitting: Cardiology

## 2016-07-28 ENCOUNTER — Ambulatory Visit (INDEPENDENT_AMBULATORY_CARE_PROVIDER_SITE_OTHER): Payer: Medicare PPO | Admitting: *Deleted

## 2016-07-28 DIAGNOSIS — I4891 Unspecified atrial fibrillation: Secondary | ICD-10-CM

## 2016-07-28 DIAGNOSIS — Z5181 Encounter for therapeutic drug level monitoring: Secondary | ICD-10-CM | POA: Diagnosis not present

## 2016-07-28 LAB — POCT INR: INR: 2.4

## 2016-07-31 ENCOUNTER — Other Ambulatory Visit: Payer: Self-pay | Admitting: Cardiology

## 2016-08-19 ENCOUNTER — Encounter: Payer: Self-pay | Admitting: Neurology

## 2016-08-19 ENCOUNTER — Ambulatory Visit (INDEPENDENT_AMBULATORY_CARE_PROVIDER_SITE_OTHER): Payer: Medicare PPO | Admitting: Neurology

## 2016-08-19 VITALS — BP 147/86 | HR 74 | Ht 67.0 in | Wt 230.0 lb

## 2016-08-19 DIAGNOSIS — I482 Chronic atrial fibrillation, unspecified: Secondary | ICD-10-CM

## 2016-08-19 DIAGNOSIS — R51 Headache: Secondary | ICD-10-CM

## 2016-08-19 DIAGNOSIS — R519 Headache, unspecified: Secondary | ICD-10-CM | POA: Insufficient documentation

## 2016-08-19 MED ORDER — GABAPENTIN 100 MG PO CAPS: 100.0000 mg | ORAL_CAPSULE | Freq: Three times a day (TID) | ORAL | 4 refills | Status: DC

## 2016-08-19 NOTE — Progress Notes (Signed)
PATIENT: Sandra Merritt DOB: [birth year]  Chief Complaint  Patient presents with  . Trigeminal Neuralgia    She is here with her daughter, [name] and friend, [name].  She has continued to have intermittent right-sided facial pain.  She tried one capsule of gabapentin 300mg  and it made her feel "out of it" and she was unwilling to try the medication again.  They would like to review MRI.  Marland Kitchen Sleep Study    She has declined evaluation at this time.     HISTORICAL  Sandra Merritt is 82 years old left-handed female accompanied by her daughter [name], and friend [name], seen in refer by her primary care physician Dr. Darcus Austin for evaluation of intermittent right facial pain and the memory loss on May 19 2016  I reviewed and summarized the referring note, she had a history of hypertension, chronic atrial fibrillation, on chronic Coumadin, hypertension, hyperlipidemia, hypothyroidism on supplement since 2005, COPD, moderate pulmonary hypertension, mild aortic stenosis, aortic regurgitation and moderate mitral valve regurgitation, osteopenia, vitamin D deficiency, obstructive sleep apnea, but took could not tolerate CPAP machine, chronic kidney disease, stage III, mild abnormal liver functional test from fatty liver  She graduated from high school, 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 Never tried any neuropathic medications in the past.  Dementia: She was noted to have memory loss since 2016, need helping with her bills, still drives  short distance, relies on GPS, she sleeps well at night, but still fatigue, sleepy at day during the day, take frequent short daytime nap, previously had abnormal sleep study, was given CPAP, but could not tolerate it  Most recent laboratory evaluation in 20-May-2016, normal CMP, with creatinine 1.09, TSH was elevated at 20 2.3,, her thyroid supplement was increased to 112 g, previous TSH was normal in February 20 second 2017 3.9, cholesterol 145, LDL 64,  I personally reviewed CT head without in Nov 2015, mild generalized atrophy.   UPDATE Aug 19 2016: She tried gabapentin 300mg , cause dizziness, sleepiness, the medication did help her right face shooting pain.  When she got out bathroom, she felt unbalanced, she still has intermittent right facial pain again, last 3-5 minutes, she has to eat soft food for a while,   We have personally reviewed MRI of the brain in October 2017, mild generalized atrophy, no acute abnormality,   REVIEW OF SYSTEMS: Full 14 system review of systems performed and notable only for ringing ears, runny nose, drooling, light sensitivity, cough, restless leg, daytime sleepiness, snoring, incontinence of bladder, joint pain, back pain, walking difficulty, headache, numbness  ALLERGIES: Allergies  Allergen Reactions  . Penicillins Hives and Rash    HOME MEDICATIONS: Current Outpatient Prescriptions  Medication Sig Dispense Refill  . acetaminophen (TYLENOL) 500 MG tablet Take 500 mg by mouth as needed.    Marland Kitchen alendronate (FOSAMAX) 70 MG tablet Take 70 mg by mouth once a week.  11  . ALPRAZolam (XANAX) 0.5 MG tablet As needed before MRI 3 tablet 0  . Calcium Carb-Cholecalciferol (CALCIUM +  D3) 600-200 MG-UNIT TABS Take 2 tablets by mouth 2 (two) times daily.    Marland Kitchen FLUZONE HIGH-DOSE 0.5 ML SUSY   0  . furosemide (LASIX) 20 MG tablet TAKE 1 TABLET (20 MG TOTAL) BY MOUTH DAILY. 30 tablet 1  . glucosamine-chondroitin 500-400 MG tablet Take 1 tablet by mouth 3 (three)  times daily.    Marland Kitchen levothyroxine (SYNTHROID, LEVOTHROID) 125 MCG tablet daily.    Marland Kitchen lovastatin (MEVACOR) 40 MG tablet Take 40 mg by mouth at bedtime.    . metoprolol succinate (TOPROL-XL) 25 MG 24 hr tablet Take 25 mg by mouth daily.    . Multiple Vitamin (MULTIVITAMIN) tablet Take 1 tablet by mouth daily.    . Multiple Vitamins-Minerals (EYE VITAMINS PO) Take by mouth 2 (two) times daily.    . ramipril (ALTACE) 10 MG capsule Take 1 capsule (10 mg total) by mouth daily. 90 capsule 0  . warfarin (COUMADIN) 5 MG tablet Take 1 tablet (5 mg total) by mouth as directed. 90 tablet 0   No current facility-administered medications for this visit.     PAST MEDICAL HISTORY: Past Medical History:  Diagnosis Date  . Chronic atrial fibrillation (HCC)    Chronic on coumadin  . CKD (chronic kidney disease), stage III   . Colon polyps   . COPD (chronic obstructive pulmonary disease) ([place])   . Dyslipidemia   . Heart murmur    Systolic heart murmur w mild AI and MR  . Hypertension   . Hypothyroid   . LFTs abnormal    History of abnormal LFTs due to Fatty Liver  . Macular degeneration   . Memory loss   . Obesity   . OSA (obstructive sleep apnea)    intolerant to CPAP  . Osteoarthritis   . Osteopenia   . Right sided facial pain   . Vitamin D deficiency     PAST SURGICAL HISTORY: Past Surgical History:  Procedure Laterality Date  . CHOLECYSTECTOMY    . KNEE SURGERY Right     FAMILY HISTORY: Family History  Problem Relation Age of Onset  . Heart disease Mother   . Heart disease Father   . Heart disease Sister   . Diabetes Sister     SOCIAL HISTORY:  Social History   Social History  . Marital status: Married    Spouse name: N/A  . Number of children: 3  . Years of education: HS   Occupational History  . Not on file.   Social History Main Topics  . Smoking status: Never Smoker  . Smokeless tobacco: Never Used  . Alcohol use No  . Drug use: No  . Sexual activity: Not on  file   Other Topics Concern  . Not on file   Social History Narrative   Lives at home with two daughters and her friend.   Left-handed.   No caffeine use.     PHYSICAL EXAM   Vitals:   08/19/16 0933  BP: (!) 147/86  Pulse: 74  Weight: 230 lb (104.3 kg)  Height: 5\' 7"  (1.702 m)    Not recorded      Body mass index is 36.02 kg/m.  PHYSICAL EXAMNIATION:  Gen: NAD, conversant, well nourised, obese, well groomed                     Cardiovascular: Regular rate rhythm, no peripheral edema, warm, nontender. Eyes: Conjunctivae clear without exudates or hemorrhage Neck: Supple, no carotid bruise. Pulmonary: Clear to  auscultation bilaterally   NEUROLOGICAL EXAM:  MENTAL STATUS: Speech:    Speech is normal; fluent and spontaneous with normal comprehension.  Cognition:Mini-Mental Status Examination 30/30, animal naming 13     Orientation to time, place and person     Normal recent and remote memory     Normal Attention span and concentration     Normal Language, naming, repeating,spontaneous speech     Fund of knowledge   CRANIAL NERVES: CN II: Visual fields are full to confrontation. Fundoscopic exam is normal with sharp discs and no vascular changes. Pupils are round equal and briskly reactive to light. CN III, IV, VI: extraocular movement are normal. No ptosis. CN V: Facial sensation is intact to pinprick in all 3 divisions bilaterally. Corneal responses are intact.  CN VII: Face is symmetric with normal eye closure and smile. CN VIII: Hearing is normal to rubbing fingers CN IX, X: Palate elevates symmetrically. Phonation is normal. CN XI: Head turning and shoulder shrug are intact CN XII: Tongue is midline with normal movements and no atrophy.  MOTOR: There is no pronator drift of out-stretched arms. Muscle bulk and tone are normal. Muscle strength is normal.  REFLEXES: Reflexes are 1 and symmetric at the biceps, triceps, knees, and ankles. Plantar responses are  flexor.  SENSORY: Intact to light touch, pinprick, positional sensation and vibratory sensation are intact in fingers and toes.  COORDINATION: Rapid alternating movements and fine finger movements are intact. There is no dysmetria on finger-to-nose and heel-knee-shin.    GAIT/STANCE: Mild antalgic, cautious gait   DIAGNOSTIC DATA (LABS, IMAGING, TESTING) - I reviewed patient records, labs, notes, testing and imaging myself where available.   ASSESSMENT AND PLAN  AMORIA ESCHETE is a 82 y.o. female   Mild cognitive impairment Trigeminal neuralgia  She could not tolerate Gabapentin 300 mg tablets, complains of dizziness, lighthead  Will try Gabapentin 100mg  tabs.  Obstructive sleep apnea  ESS score is 13,  Refer her to sleep study  Marcial Pacas, M.D. Ph.D.  [facility] [address] [place], [place] [address] Ph: [phone] Fax: [phone]  CC: Darcus Austin, MD

## 2016-08-27 ENCOUNTER — Other Ambulatory Visit: Payer: Self-pay | Admitting: Family Medicine

## 2016-08-27 DIAGNOSIS — Z1231 Encounter for screening mammogram for malignant neoplasm of breast: Secondary | ICD-10-CM

## 2016-08-29 ENCOUNTER — Ambulatory Visit (INDEPENDENT_AMBULATORY_CARE_PROVIDER_SITE_OTHER): Payer: Medicare PPO

## 2016-08-29 DIAGNOSIS — Z5181 Encounter for therapeutic drug level monitoring: Secondary | ICD-10-CM

## 2016-08-29 DIAGNOSIS — I4891 Unspecified atrial fibrillation: Secondary | ICD-10-CM | POA: Diagnosis not present

## 2016-08-29 LAB — POCT INR: INR: 3.3

## 2016-09-13 ENCOUNTER — Other Ambulatory Visit: Payer: Self-pay | Admitting: Cardiology

## 2016-09-19 ENCOUNTER — Other Ambulatory Visit: Payer: Self-pay | Admitting: *Deleted

## 2016-09-19 ENCOUNTER — Telehealth: Payer: Self-pay | Admitting: Cardiology

## 2016-09-19 ENCOUNTER — Ambulatory Visit: Payer: Medicare PPO | Admitting: Cardiology

## 2016-09-19 MED ORDER — WARFARIN SODIUM 5 MG PO TABS: 5.0000 mg | ORAL_TABLET | ORAL | 1 refills | Status: DC

## 2016-09-19 MED ORDER — RAMIPRIL 10 MG PO CAPS: 10.0000 mg | ORAL_CAPSULE | Freq: Every day | ORAL | 0 refills | Status: DC

## 2016-09-19 NOTE — Telephone Encounter (Signed)
New Message   *STAT* If patient is at the pharmacy, call can be transferred to refill team.   1. Which medications need to be refilled? (please list name of each medication and dose if known)  gabapentin (Neurontin) 100 mg capsul three times daily ramipril (Altace) 10 mg capsule once daily warfarin (Coumadin) 5 mg tablet once daily  2. Which pharmacy/location (including street and city if local pharmacy) is medication to be sent to? [facility]  3. Do they need a 30 day or 90 day supply? 90 day supply  Pt voiced [employer] faxed over a form for refill.  Please f/u

## 2016-09-19 NOTE — Telephone Encounter (Signed)
I have refilled ramipril. Will route to coumadin clinic for refill on warfarin. Gabapentin is refilled by patients neurologist but she has refills.

## 2016-09-21 DIAGNOSIS — M810 Age-related osteoporosis without current pathological fracture: Secondary | ICD-10-CM | POA: Diagnosis not present

## 2016-09-21 DIAGNOSIS — G629 Polyneuropathy, unspecified: Secondary | ICD-10-CM | POA: Diagnosis not present

## 2016-09-21 DIAGNOSIS — M545 Low back pain: Secondary | ICD-10-CM | POA: Diagnosis not present

## 2016-09-21 DIAGNOSIS — E039 Hypothyroidism, unspecified: Secondary | ICD-10-CM | POA: Diagnosis not present

## 2016-09-21 DIAGNOSIS — H547 Unspecified visual loss: Secondary | ICD-10-CM | POA: Diagnosis not present

## 2016-09-21 DIAGNOSIS — E785 Hyperlipidemia, unspecified: Secondary | ICD-10-CM | POA: Diagnosis not present

## 2016-09-21 DIAGNOSIS — M1990 Unspecified osteoarthritis, unspecified site: Secondary | ICD-10-CM | POA: Diagnosis not present

## 2016-09-21 DIAGNOSIS — I10 Essential (primary) hypertension: Secondary | ICD-10-CM | POA: Diagnosis not present

## 2016-09-21 DIAGNOSIS — I4891 Unspecified atrial fibrillation: Secondary | ICD-10-CM | POA: Diagnosis not present

## 2016-09-22 ENCOUNTER — Other Ambulatory Visit: Payer: Self-pay | Admitting: *Deleted

## 2016-09-22 ENCOUNTER — Ambulatory Visit (INDEPENDENT_AMBULATORY_CARE_PROVIDER_SITE_OTHER): Payer: Medicare HMO | Admitting: Cardiology

## 2016-09-22 ENCOUNTER — Encounter: Payer: Self-pay | Admitting: Cardiology

## 2016-09-22 ENCOUNTER — Ambulatory Visit (INDEPENDENT_AMBULATORY_CARE_PROVIDER_SITE_OTHER): Payer: Medicare HMO | Admitting: *Deleted

## 2016-09-22 VITALS — BP 152/84 | HR 77 | Ht 66.0 in | Wt 240.0 lb

## 2016-09-22 DIAGNOSIS — I4821 Permanent atrial fibrillation: Secondary | ICD-10-CM

## 2016-09-22 DIAGNOSIS — I272 Pulmonary hypertension, unspecified: Secondary | ICD-10-CM | POA: Diagnosis not present

## 2016-09-22 DIAGNOSIS — I482 Chronic atrial fibrillation: Secondary | ICD-10-CM

## 2016-09-22 DIAGNOSIS — I10 Essential (primary) hypertension: Secondary | ICD-10-CM

## 2016-09-22 DIAGNOSIS — I350 Nonrheumatic aortic (valve) stenosis: Secondary | ICD-10-CM

## 2016-09-22 DIAGNOSIS — I340 Nonrheumatic mitral (valve) insufficiency: Secondary | ICD-10-CM

## 2016-09-22 DIAGNOSIS — Z5181 Encounter for therapeutic drug level monitoring: Secondary | ICD-10-CM

## 2016-09-22 LAB — POCT INR: INR: 2.7

## 2016-09-22 MED ORDER — WARFARIN SODIUM 5 MG PO TABS: 5.0000 mg | ORAL_TABLET | ORAL | 1 refills | Status: DC

## 2016-09-22 MED ORDER — RAMIPRIL 10 MG PO CAPS: 10.0000 mg | ORAL_CAPSULE | Freq: Every day | ORAL | 3 refills | Status: DC

## 2016-09-22 MED ORDER — FUROSEMIDE 20 MG PO TABS: 20.0000 mg | ORAL_TABLET | Freq: Every day | ORAL | 3 refills | Status: DC

## 2016-09-22 NOTE — Patient Instructions (Signed)
Medication Instructions:  Your physician recommends that you continue on your current medications as directed. Please refer to the Current Medication list given to you today.   Labwork: TODAY: BMET  Testing/Procedures: Your physician has requested that you have an echocardiogram. Echocardiography is a painless test that uses sound waves to create images of your heart. It provides your doctor with information about the size and shape of your heart and how well your heart's chambers and valves are working. This procedure takes approximately one hour. There are no restrictions for this procedure.  Follow-Up: Your physician wants you to follow-up in: 1 year with Dr. Radford Pax. You will receive a reminder letter in the mail two months in advance. If you don't receive a letter, please call our office to schedule the follow-up appointment.   Any Other Special Instructions Will Be Listed Below (If Applicable).     If you need a refill on your cardiac medications before your next appointment, please call your pharmacy.

## 2016-09-22 NOTE — Progress Notes (Signed)
Cardiology Office Note    Date:  09/22/2016   ID:  Sandra Merritt, DOB 01/24/1932, MRN [id number]  PCP:  Marjorie Smolder, MD  Cardiologist:  Fransico Him, MD   Chief Complaint  Patient presents with  . Atrial Fibrillation  . Hypertension  . Aortic Stenosis  . Mitral Regurgitation    History of Present Illness:  Sandra Merritt is a 83 y.o. female with a history of permanent atrial fibrillation on systemic anticoagulation, moderate MR, mild AS, moderate pulmonary HTN and HTN who presents today for followup. She is doing well. She denies any chest pain,palpitations or syncope.  She says that she was very sedentary for a week due to the weather and when she started to go back outside walking she noticed some mild SOB but says that she is back to normal.  She has chronic LE edema controlled with HTCZ.  It usually resolves by the am. She has had some dizziness when getting up too quick from sitting.    Past Medical History:  Diagnosis Date  . Aortic stenosis    mild by echo 2017  . CKD (chronic kidney disease), stage III   . Colon polyps   . COPD (chronic obstructive pulmonary disease) ([place])   . Dyslipidemia   . Hypertension   . Hypothyroid   . LFTs abnormal    History of abnormal LFTs due to Fatty Liver  . Macular degeneration   . Memory loss   . Mitral regurgitation    mdoerate by echo 07/2015  . Obesity   . OSA (obstructive sleep apnea)    intolerant to CPAP  . Osteoarthritis   . Osteopenia   . Permanent atrial fibrillation (H[facility])    Chronic on coumadin  . Pulmonary HTN    moderate with PASP 46mmHg on echo 07/2015  . Right sided facial pain   . Vitamin D deficiency     Past Surgical History:  Procedure Laterality Date  . CHOLECYSTECTOMY    . KNEE SURGERY Right     Current Medications: Outpatient Medications Prior to Visit  Medication Sig Dispense Refill  . acetaminophen (TYLENOL) 500 MG tablet Take 500 mg by mouth as needed.    Marland Kitchen alendronate (FOSAMAX) 70 MG  tablet Take 70 mg by mouth once a week.  11  . ALPRAZolam (XANAX) 0.5 MG tablet As needed before MRI 3 tablet 0  . Calcium Carb-Cholecalciferol (CALCIUM + D3) 600-200 MG-UNIT TABS Take 2 tablets by mouth 2 (two) times daily.    . furosemide (LASIX) 20 MG tablet TAKE 1 TABLET (20 MG TOTAL) BY MOUTH DAILY. 30 tablet 1  . gabapentin (NEURONTIN) 100 MG capsule Take 1 capsule (100 mg total) by mouth 3 (three) times daily. 270 capsule 4  . glucosamine-chondroitin 500-400 MG tablet Take 1 tablet by mouth 3 (three) times daily.    Marland Kitchen levothyroxine (SYNTHROID, LEVOTHROID) 125 MCG tablet Take 125 mcg by mouth daily.     Marland Kitchen lovastatin (MEVACOR) 40 MG tablet Take 40 mg by mouth at bedtime.    . metoprolol succinate (TOPROL-XL) 25 MG 24 hr tablet Take 25 mg by mouth daily.    . Multiple Vitamin (MULTIVITAMIN) tablet Take 1 tablet by mouth daily.    . Multiple Vitamins-Minerals (EYE VITAMINS PO) Take by mouth 2 (two) times daily.    . ramipril (ALTACE) 10 MG capsule Take 1 capsule (10 mg total) by mouth daily. 90 capsule 0  . warfarin (COUMADIN) 5 MG tablet Take 1 tablet (5  mg total) by mouth as directed. 90 tablet 1  . FLUZONE HIGH-DOSE 0.5 ML SUSY   0   No facility-administered medications prior to visit.      Allergies:   Penicillins   Social History   Social History  . Marital status: Married    Spouse name: N/A  . Number of children: 3  . Years of education: HS   Social History Main Topics  . Smoking status: Never Smoker  . Smokeless tobacco: Never Used  . Alcohol use No  . Drug use: No  . Sexual activity: Not Asked   Other Topics Concern  . None   Social History Narrative   Lives at home with two daughters and her friend.   Left-handed.   No caffeine use.     Family History:  The patient's family history includes Diabetes in her sister; Heart disease in her father, mother, and sister.   ROS:   Please see the history of present illness.    ROS All other systems reviewed and are  negative.  No flowsheet data found.     PHYSICAL EXAM:   VS:  BP (!) 152/84   Pulse 77   Ht 5\' 6"  (1.676 m)   Wt 240 lb (108.9 kg)   SpO2 98%   BMI 38.74 kg/m    GEN: Well nourished, well developed, in no acute distress  HEENT: normal  Neck: no JVD, carotid bruits, or masses Cardiac: RRR; no rubs, or gallops.  Trace edema.  Intact distal pulses bilaterally. 2/6 SM at RUSB to LUSB, 2/6 systolic Murmur at apex Respiratory:  clear to auscultation bilaterally, normal work of breathing GI: soft, nontender, nondistended, + BS MS: no deformity or atrophy  Skin: warm and dry, no rash Neuro:  Alert and Oriented x 3, Strength and sensation are intact Psych: euthymic mood, full affect  Wt Readings from Last 3 Encounters:  09/22/16 240 lb (108.9 kg)  08/19/16 230 lb (104.3 kg)  05/19/16 229 lb 8 oz (104.1 kg)      Studies/Labs Reviewed:   EKG:  EKG is ordered today.  The ekg ordered today demonstrates atrial fibrillation with CVR with PVC and low voltage QRS  Recent Labs: No results found for requested labs within last 8760 hours.   Lipid Panel No results found for: CHOL, TRIG, HDL, CHOLHDL, VLDL, LDLCALC, LDLDIRECT  Additional studies/ records that were reviewed today include:  none    ASSESSMENT:    1. Permanent atrial fibrillation ([place])   2. Benign essential HTN   3. Pulmonary HTN   4. Mitral valve insufficiency, unspecified etiology   5. Aortic valve stenosis, etiology of cardiac valve disease unspecified      PLAN:  In order of problems listed above:  1. Permanent atrial fibrillation - rate controlled.  She will continue on BB and warfarin. 2.   HTN - BP borderline controlled on current meds today but she has a caregiver who checks it and yesterday it was 130/29mmHg.  Continue BB and ACE I. Check BMET. 3.   Moderate pulmonary HTN - I will repeat an echo to make sure this has not progressed. 4.   Mild to moderate mitral regurgitation - I will repeat an echo to  make sure this is stable 5.   Mild aortic stenosis - repeat echo for stability   Medication Adjustments/Labs and Tests Ordered: Current medicines are reviewed at length with the patient today.  Concerns regarding medicines are outlined above.  Medication changes, Labs  and Tests ordered today are listed in the Patient Instructions below.  There are no Patient Instructions on file for this visit.   Signed, Fransico Him, MD  09/22/2016 1:32 PM    [facility] [place], [place], [place]  [address] Phone: [phone]; Fax: [phone]

## 2016-09-23 LAB — BASIC METABOLIC PANEL
BUN / CREAT RATIO: 14 (ref 12–28)
BUN: 15 mg/dL (ref 8–27)
CALCIUM: 9.8 mg/dL (ref 8.7–10.3)
CO2: 28 mmol/L (ref 18–29)
Chloride: 97 mmol/L (ref 96–106)
Creatinine, Ser: 1.06 mg/dL — ABNORMAL HIGH (ref 0.57–1.00)
GFR, EST AFRICAN AMERICAN: 57 mL/min/{1.73_m2} — AB (ref >59)
GFR, EST NON AFRICAN AMERICAN: 49 mL/min/{1.73_m2} — AB (ref >59)
Glucose: 90 mg/dL (ref 65–99)
POTASSIUM: 4.3 mmol/L (ref 3.5–5.2)
Sodium: 142 mmol/L (ref 134–144)

## 2016-09-29 ENCOUNTER — Telehealth: Payer: Self-pay | Admitting: Cardiology

## 2016-09-29 NOTE — Telephone Encounter (Signed)
Returned call to [facility] regarding pt Warfarin/Coumadin refill that was sent in electronically on 09/22/16, spoke with Janett Billow a [employer] & she stated they deleted the refill out of their system inadvertently.  She stated I would have to speak to a Pharmacist to give the order. After being on the phone 12 minutes, I spoke with Rob, Pharmacist & gave the verbal order for Warfarin 5mg  take 1 tablet daily or as directed by [facility]; 90 tablets with 1 refill. Updated pt with the information.

## 2016-09-29 NOTE — Telephone Encounter (Signed)
New message       Pt states that [facility] mail order cannot fill her coumadin because they need more infor from [place].  [facility] told the pt that they have tried to call us but was not successful.  Please call [facility] at [phone] to give more info so that they can refill pt's coumadin.  Please call pt if there is a problem

## 2016-10-06 ENCOUNTER — Ambulatory Visit
Admission: RE | Admit: 2016-10-06 | Discharge: 2016-10-06 | Disposition: A | Discharge status: Home / Self Care | Payer: Medicare HMO | Source: Ambulatory Visit | Attending: Family Medicine | Admitting: Family Medicine

## 2016-10-06 DIAGNOSIS — Z1231 Encounter for screening mammogram for malignant neoplasm of breast: Secondary | ICD-10-CM

## 2016-10-07 ENCOUNTER — Other Ambulatory Visit: Payer: Self-pay | Admitting: Family Medicine

## 2016-10-07 DIAGNOSIS — R928 Other abnormal and inconclusive findings on diagnostic imaging of breast: Secondary | ICD-10-CM

## 2016-10-10 DIAGNOSIS — E039 Hypothyroidism, unspecified: Secondary | ICD-10-CM | POA: Diagnosis not present

## 2016-10-15 ENCOUNTER — Ambulatory Visit
Admission: RE | Admit: 2016-10-15 | Discharge: 2016-10-15 | Disposition: A | Discharge status: Home / Self Care | Payer: Medicare HMO | Source: Ambulatory Visit | Attending: Family Medicine | Admitting: Family Medicine

## 2016-10-15 ENCOUNTER — Other Ambulatory Visit: Payer: Self-pay | Admitting: Family Medicine

## 2016-10-15 DIAGNOSIS — R928 Other abnormal and inconclusive findings on diagnostic imaging of breast: Secondary | ICD-10-CM

## 2016-10-15 DIAGNOSIS — N6459 Other signs and symptoms in breast: Secondary | ICD-10-CM | POA: Diagnosis not present

## 2016-10-20 ENCOUNTER — Other Ambulatory Visit: Payer: Self-pay

## 2016-10-20 ENCOUNTER — Ambulatory Visit (HOSPITAL_COMMUNITY): Payer: Medicare HMO | Attending: Cardiology

## 2016-10-20 ENCOUNTER — Ambulatory Visit (INDEPENDENT_AMBULATORY_CARE_PROVIDER_SITE_OTHER): Payer: Medicare HMO | Admitting: *Deleted

## 2016-10-20 DIAGNOSIS — Z5181 Encounter for therapeutic drug level monitoring: Secondary | ICD-10-CM

## 2016-10-20 DIAGNOSIS — Z6838 Body mass index (BMI) 38.0-38.9, adult: Secondary | ICD-10-CM | POA: Diagnosis not present

## 2016-10-20 DIAGNOSIS — I4821 Permanent atrial fibrillation: Secondary | ICD-10-CM

## 2016-10-20 DIAGNOSIS — I340 Nonrheumatic mitral (valve) insufficiency: Secondary | ICD-10-CM

## 2016-10-20 DIAGNOSIS — I10 Essential (primary) hypertension: Secondary | ICD-10-CM | POA: Diagnosis not present

## 2016-10-20 DIAGNOSIS — I272 Pulmonary hypertension, unspecified: Secondary | ICD-10-CM

## 2016-10-20 DIAGNOSIS — J449 Chronic obstructive pulmonary disease, unspecified: Secondary | ICD-10-CM | POA: Diagnosis not present

## 2016-10-20 DIAGNOSIS — I083 Combined rheumatic disorders of mitral, aortic and tricuspid valves: Secondary | ICD-10-CM | POA: Insufficient documentation

## 2016-10-20 DIAGNOSIS — I482 Chronic atrial fibrillation: Secondary | ICD-10-CM

## 2016-10-20 DIAGNOSIS — E785 Hyperlipidemia, unspecified: Secondary | ICD-10-CM | POA: Diagnosis not present

## 2016-10-20 DIAGNOSIS — I4891 Unspecified atrial fibrillation: Secondary | ICD-10-CM | POA: Insufficient documentation

## 2016-10-20 DIAGNOSIS — E669 Obesity, unspecified: Secondary | ICD-10-CM | POA: Insufficient documentation

## 2016-10-20 LAB — POCT INR: INR: 2.5

## 2016-10-21 ENCOUNTER — Other Ambulatory Visit: Payer: Self-pay | Admitting: Surgery

## 2016-10-21 DIAGNOSIS — L539 Erythematous condition, unspecified: Secondary | ICD-10-CM | POA: Diagnosis not present

## 2016-10-21 DIAGNOSIS — I890 Lymphedema, not elsewhere classified: Secondary | ICD-10-CM | POA: Diagnosis not present

## 2016-10-29 DIAGNOSIS — I481 Persistent atrial fibrillation: Secondary | ICD-10-CM | POA: Diagnosis not present

## 2016-10-29 DIAGNOSIS — E7800 Pure hypercholesterolemia, unspecified: Secondary | ICD-10-CM | POA: Diagnosis not present

## 2016-10-29 DIAGNOSIS — I10 Essential (primary) hypertension: Secondary | ICD-10-CM | POA: Diagnosis not present

## 2016-10-29 DIAGNOSIS — E039 Hypothyroidism, unspecified: Secondary | ICD-10-CM | POA: Diagnosis not present

## 2016-10-29 DIAGNOSIS — R21 Rash and other nonspecific skin eruption: Secondary | ICD-10-CM | POA: Diagnosis not present

## 2016-10-29 DIAGNOSIS — E669 Obesity, unspecified: Secondary | ICD-10-CM | POA: Diagnosis not present

## 2016-11-17 ENCOUNTER — Ambulatory Visit (INDEPENDENT_AMBULATORY_CARE_PROVIDER_SITE_OTHER): Payer: Medicare HMO | Admitting: *Deleted

## 2016-11-17 DIAGNOSIS — I482 Chronic atrial fibrillation: Secondary | ICD-10-CM

## 2016-11-17 DIAGNOSIS — I4821 Permanent atrial fibrillation: Secondary | ICD-10-CM

## 2016-11-17 DIAGNOSIS — Z5181 Encounter for therapeutic drug level monitoring: Secondary | ICD-10-CM | POA: Diagnosis not present

## 2016-11-17 LAB — POCT INR: INR: 2.4

## 2016-11-26 DIAGNOSIS — E039 Hypothyroidism, unspecified: Secondary | ICD-10-CM | POA: Diagnosis not present

## 2016-12-08 ENCOUNTER — Ambulatory Visit (HOSPITAL_COMMUNITY)
Admission: RE | Admit: 2016-12-08 | Discharge: 2016-12-08 | Disposition: A | Discharge status: Home / Self Care | Payer: Medicare HMO | Source: Ambulatory Visit | Attending: Cardiology | Admitting: Cardiology

## 2016-12-08 ENCOUNTER — Encounter (HOSPITAL_COMMUNITY): Payer: Self-pay | Admitting: *Deleted

## 2016-12-08 ENCOUNTER — Encounter (HOSPITAL_COMMUNITY): Payer: Self-pay

## 2016-12-08 ENCOUNTER — Other Ambulatory Visit (HOSPITAL_COMMUNITY): Payer: Self-pay | Admitting: *Deleted

## 2016-12-08 VITALS — BP 128/77 | HR 71 | Wt 233.8 lb

## 2016-12-08 DIAGNOSIS — I5032 Chronic diastolic (congestive) heart failure: Secondary | ICD-10-CM | POA: Insufficient documentation

## 2016-12-08 DIAGNOSIS — G4733 Obstructive sleep apnea (adult) (pediatric): Secondary | ICD-10-CM | POA: Diagnosis not present

## 2016-12-08 DIAGNOSIS — Z7983 Long term (current) use of bisphosphonates: Secondary | ICD-10-CM | POA: Diagnosis not present

## 2016-12-08 DIAGNOSIS — Z833 Family history of diabetes mellitus: Secondary | ICD-10-CM | POA: Insufficient documentation

## 2016-12-08 DIAGNOSIS — I272 Pulmonary hypertension, unspecified: Secondary | ICD-10-CM | POA: Diagnosis not present

## 2016-12-08 DIAGNOSIS — E039 Hypothyroidism, unspecified: Secondary | ICD-10-CM | POA: Insufficient documentation

## 2016-12-08 DIAGNOSIS — I482 Chronic atrial fibrillation: Secondary | ICD-10-CM | POA: Insufficient documentation

## 2016-12-08 DIAGNOSIS — Z79899 Other long term (current) drug therapy: Secondary | ICD-10-CM | POA: Insufficient documentation

## 2016-12-08 DIAGNOSIS — Z8249 Family history of ischemic heart disease and other diseases of the circulatory system: Secondary | ICD-10-CM | POA: Diagnosis not present

## 2016-12-08 DIAGNOSIS — N183 Chronic kidney disease, stage 3 (moderate): Secondary | ICD-10-CM | POA: Diagnosis not present

## 2016-12-08 DIAGNOSIS — E785 Hyperlipidemia, unspecified: Secondary | ICD-10-CM | POA: Diagnosis not present

## 2016-12-08 DIAGNOSIS — I130 Hypertensive heart and chronic kidney disease with heart failure and stage 1 through stage 4 chronic kidney disease, or unspecified chronic kidney disease: Secondary | ICD-10-CM | POA: Diagnosis not present

## 2016-12-08 DIAGNOSIS — I4821 Permanent atrial fibrillation: Secondary | ICD-10-CM

## 2016-12-08 DIAGNOSIS — I350 Nonrheumatic aortic (valve) stenosis: Secondary | ICD-10-CM | POA: Diagnosis not present

## 2016-12-08 DIAGNOSIS — Z7901 Long term (current) use of anticoagulants: Secondary | ICD-10-CM | POA: Diagnosis not present

## 2016-12-08 LAB — BASIC METABOLIC PANEL
Anion gap: 9 (ref 5–15)
BUN: 17 mg/dL (ref 6–20)
CALCIUM: 9.4 mg/dL (ref 8.9–10.3)
CO2: 31 mmol/L (ref 22–32)
CREATININE: 0.9 mg/dL (ref 0.44–1.00)
Chloride: 99 mmol/L — ABNORMAL LOW (ref 101–111)
GFR calc Af Amer: >60 mL/min (ref >60)
GFR calc non Af Amer: 58 mL/min — ABNORMAL LOW (ref >60)
GLUCOSE: 90 mg/dL (ref 65–99)
Potassium: 4.6 mmol/L (ref 3.5–5.1)
Sodium: 139 mmol/L (ref 135–145)

## 2016-12-08 LAB — CBC
HCT: 36 % (ref 36.0–46.0)
Hemoglobin: 11.7 g/dL — ABNORMAL LOW (ref 12.0–15.0)
MCH: 35.6 pg — AB (ref 26.0–34.0)
MCHC: 32.5 g/dL (ref 30.0–36.0)
MCV: 109.4 fL — AB (ref 78.0–100.0)
PLATELETS: 120 10*3/uL — AB (ref 150–400)
RBC: 3.29 MIL/uL — ABNORMAL LOW (ref 3.87–5.11)
RDW: 14.9 % (ref 11.5–15.5)
WBC: 4.4 10*3/uL (ref 4.0–10.5)

## 2016-12-08 LAB — PROTIME-INR
INR: 1.75
PROTHROMBIN TIME: 20.6 s — AB (ref 11.4–15.2)

## 2016-12-08 LAB — BRAIN NATRIURETIC PEPTIDE: B Natriuretic Peptide: 557.7 pg/mL — ABNORMAL HIGH (ref 0.0–100.0)

## 2016-12-08 MED ORDER — FUROSEMIDE 40 MG PO TABS: 40.0000 mg | ORAL_TABLET | Freq: Every day | ORAL | 3 refills | Status: DC

## 2016-12-08 NOTE — Progress Notes (Signed)
PCP: Dr. Darcus Austin Primary Cardiology: Dr. Radford Pax HF Cardiology: Dr. Aundra Dubin  83 yo with history of permanent atrial fibrillation and diastolic CHF presents for CHF clinic evaluation.  She has been followed by Dr. Radford Pax.  She has had atrial fibrillation since 2002, cardioversion was unsuccessful at that time.  She has done reasonably well over the years, but more recently has developed progressive exercise intolerance.  She is limited by knee arthritis and low back pain, but clearly has become more short of breath over the last 6 months. She is not very active.  She is short of breath walking up any stairs. She is short of breath dressing.  She is short of breath walking longer distances around her house.  No orthopnea/PND.  No chest pain.  She does not feel palpitations and her rate is controlled.  She is taking a low dose of Lasix, 20 mg daily.  On warfarin, no BRPBR or melena. There was concern for significant pulmonary hypertension on her most recent echo, so she was referred here by Dr. Radford Pax for evaluation.    ECG (1/18, personally reviewed): atrial fibrillation, PVC  Labs (1/18): creatinine 1.06  PMH: 1. Atrial fibrillation: Permanent. Present since 2002.  2. H/o cholecystectomy  3. Hypothyroidism 4. HTN 5. Hyperlipidemia 6. OSA: Cannot tolerate CPAP.  7. CKD stage III 8. Chronic diastolic CHF: Echo (3/42) with EF 50-55%, moderate to severe LVH, moderate AS (AVA 1.0 cm^2, mean gradient 21 mmHg), mild AI, mild RV dilation with moderately decreased RV systolic function, moderate to severe TR, PASP 69 mmHg, moderate mitral regurgitation.  9. Aortic stenosis: Moderate on last echo in 2/18.   Social History   Social History  . Marital status: Married    Spouse name: N/A  . Number of children: 3  . Years of education: HS   Occupational History  . Not on file.   Social History Main Topics  . Smoking status: Never Smoker  . Smokeless tobacco: Never Used  . Alcohol use No  . Drug  use: No  . Sexual activity: Not on file   Other Topics Concern  . Not on file   Social History Narrative   Lives at home with two daughters and her friend.   Left-handed.   No caffeine use.   Family History  Problem Relation Age of Onset  . Heart disease Mother   . Heart disease Father   . Heart disease Sister   . Diabetes Sister    ROS: All systems reviewed and negative except as per HPI.  Current Outpatient Prescriptions  Medication Sig Dispense Refill  . acetaminophen (TYLENOL) 500 MG tablet Take 500 mg by mouth as needed.    Marland Kitchen alendronate (FOSAMAX) 70 MG tablet Take 70 mg by mouth once a week.  11  . ALPRAZolam (XANAX) 0.5 MG tablet As needed before MRI 3 tablet 0  . Calcium Carb-Cholecalciferol (CALCIUM + D3) 600-200 MG-UNIT TABS Take 2 tablets by mouth 2 (two) times daily.    . furosemide (LASIX) 40 MG tablet Take 1 tablet (40 mg total) by mouth daily. 90 tablet 3  . gabapentin (NEURONTIN) 100 MG capsule Take 1 capsule (100 mg total) by mouth 3 (three) times daily. 270 capsule 4  . glucosamine-chondroitin 500-400 MG tablet Take 1 tablet by mouth 3 (three) times daily.    Marland Kitchen levothyroxine (SYNTHROID, LEVOTHROID) 137 MCG tablet Take 137 mcg by mouth daily before breakfast.    . lovastatin (MEVACOR) 40 MG tablet Take 40 mg  by mouth at bedtime.    . metoprolol succinate (TOPROL-XL) 25 MG 24 hr tablet Take 25 mg by mouth daily.    . Multiple Vitamin (MULTIVITAMIN) tablet Take 1 tablet by mouth daily.    . Multiple Vitamins-Minerals (EYE VITAMINS PO) Take by mouth 2 (two) times daily.    . ramipril (ALTACE) 10 MG capsule Take 1 capsule (10 mg total) by mouth daily. 90 capsule 3  . warfarin (COUMADIN) 5 MG tablet Take 1 tablet (5 mg total) by mouth as directed. 90 tablet 1   No current facility-administered medications for this encounter.    BP 128/77   Pulse 71   Wt 233 lb 12 oz (106 kg)   SpO2 97%   BMI 37.73 kg/m  General: NAD Neck: JVP 10-12 cm, no thyromegaly or  thyroid nodule.  Lungs: Clear to auscultation bilaterally with normal respiratory effort. CV: Nondisplaced PMI.  Heart irregular S1/S2, no S3/S4, 3/6 crescendo-decrescendo systolic murmur RUSB with clear S2.  1+ edema to knees bilaterally.  No carotid bruit.  Normal pedal pulses.  Abdomen: Soft, nontender, no hepatosplenomegaly, no distention.  Skin: Intact without lesions or rashes.  Neurologic: Alert and oriented x 3.  Psych: Normal affect. Extremities: No clubbing or cyanosis.  HEENT: Normal.   Assessment/Plan: 1. Atrial fibrillation: Permanent.  Adequate rate control on current Toprol XL.  She is on warfarin with no problems.  2. Chronic diastolic CHF: With prominent RV failure noted on most recent echo.  It is very possible that her elevated PA pressure by echo is due to diastolic CHF with elevated left atrial pressure. On exam, she is volume overloaded.  She has NYHA class III symptoms.   - Increase Lasix to 40 mg daily.  Check BMET/BNP today and again in 10 days.  She may need more diuretic than this, but will see how she does with this increase.  - Needs to cut back on sodium in diet, recommended ideal 1500 mg/day sodium diet and to keep fluid intake < 2 L/day.  3. Aortic stenosis: I reviewed her prior echo.  I think that the aortic stenosis is indeed moderate (not severe).  Mean gradient is 21 mmHg and I recalculated the AVA at 1.0 cm^2.  4. Pulmonary hypertension: As above, suspect this is most likely group 2 PH due to LV diastolic dysfunction/elevated LA pressure.  However, I do think that it is reasonable to rule out component of group 1 PH/pulmonary arterial hypertension.   - I will arrange for RHC => we discussed risks/benefits and she agrees to proceed.  Will plan on doing this next week.  She will hold warfarin x 2 days prior.   Loralie Champagne 12/08/2016

## 2016-12-08 NOTE — Patient Instructions (Signed)
Increase Furosemide to 40 mg daily  Labs today  Right Heart Catheterization on Mon 12/15/16, see instruction sheet  Do the following things EVERYDAY: 1) Weigh yourself in the morning before breakfast. Write it down and keep it in a log. 2) Take your medicines as prescribed 3) Eat low salt foods-Limit salt (sodium) to 1500-1700 mg per day.  4) Stay as active as you can everyday 5) Limit all fluids for the day to less than 2 liters  Your physician recommends that you schedule a follow-up appointment in: 3 weeks   Cooking With Less Salt Cooking with less salt is one way to reduce the amount of sodium you get from food. Depending on your condition and overall health, your health care provider or diet and nutrition specialist (dietitian) may recommend that you reduce your sodium intake. Most people should have less than 2,300 milligrams (mg) of sodium each day. If you have high blood pressure (hypertension), you may need to limit your sodium to 1,500 mg each day. Follow the tips below to help reduce your sodium intake. What do I need to know about cooking with less salt? Shopping   Buy sodium-free or low-sodium products. Look for the following words on food labels:  Low-sodium.  Sodium-free.  Reduced-sodium.  No salt added.  Unsalted.  Buy fresh or frozen vegetables. Avoid canned vegetables.  Avoid buying meats or protein foods that have been injected with broth or saline solution.  Avoid cured or smoked meats, such as hot dogs, bacon, salami, ham, and bologna. Reading food labels   Check the food label before buying or using packaged ingredients.  Look for products with no more than 140 mg of sodium in one serving.  Do not choose foods with salt as one of the first three ingredients on the ingredients list. If salt is one of the first three ingredients, it usually means the item is high in sodium, because ingredients are listed in order of amount in the food item. Cooking    Use herbs, seasonings without salt, and spices as substitutes for salt in foods.  Use sodium-free baking soda when baking.  Grill, braise, or roast foods to add flavor with less salt.  Avoid adding salt to pasta, rice, or hot cereals while cooking.  Drain and rinse canned vegetables before use.  Avoid adding salt when cooking sweets and desserts.  Cook with low-sodium ingredients. What are some salt alternatives? The following are herbs, seasonings, and spices that can be used instead of salt to give taste to your food. Herbs should be fresh or dried. Do not choose packaged mixes. Next to the name of the herb, spice, or seasoning are some examples of foods you can pair it with. Herbs   Bay leaves - Soups, meat and vegetable dishes, and spaghetti sauce.  Basil - [employer], soups, pasta, and fish dishes.  Cilantro - Meat, poultry, and vegetable dishes.  Chili powder - Marinades and Mexican dishes.  Chives - Salad dressings and potato dishes.  Cumin - Mexican dishes, couscous, and meat dishes.  Dill - Fish dishes, sauces, and salads.  Fennel - Meat and vegetable dishes, breads, and cookies.  Garlic (do not use garlic salt) - [place] dishes, meat dishes, salad dressings, and sauces.  Marjoram - Soups, potato dishes, and meat dishes.  Oregano - Pizza and spaghetti sauce.  Parsley - Salads, soups, pasta, and meat dishes.  Rosemary - [place] dishes, salad dressings, soups, and red meats.  Saffron - Fish dishes, pasta,  and some poultry dishes.  Sage - Stuffings and sauces.  Tarragon - Fish and [employer].  Thyme - Stuffing, meat, and fish dishes. Seasonings   Lemon juice - Fish dishes, poultry dishes, vegetables, and salads.  Vinegar - Salad dressings, vegetables, and fish dishes. Spices   Cinnamon - Sweet dishes, such as cakes, cookies, and puddings.  Cloves - Gingerbread, puddings, and marinades for meats.  Curry - Vegetable dishes, fish and poultry  dishes, and stir-fry dishes.  Ginger - Vegetables dishes, fish dishes, and stir-fry dishes.  Nutmeg - Pasta, vegetables, poultry, fish dishes, and custard. What are some low-sodium ingredients and foods?  Fresh or frozen fruits and vegetables with no sauce added.  Fresh or frozen whole meats, poultry, and fish with no sauce added.  Eggs.  Noodles, pasta, quinoa, rice.  Shredded or puffed wheat or puffed rice.  Regular or quick oats.  Milk, yogurt, hard cheeses, and low-sodium cheeses. Good cheese choices include Swiss, [place]. Always check the label for the serving size and sodium content.  Unsalted butter or margarine.  Unsalted nuts.  Sherbet or ice cream (keep to  cup per serving).  Homemade pudding.  Sodium-free baking soda and baking powder. This is not a complete list of low-sodium ingredients and foods. Contact your dietitian for more options.  Summary  Cooking with less salt is one way to reduce the amount of sodium that you get from food.  Buy sodium-free or low-sodium products.  Check the food label before using or buying packaged ingredients.  Use herbs, seasonings without salt, and spices as substitutes for salt in foods. This information is not intended to replace advice given to you by your health care provider. Make sure you discuss any questions you have with your health care provider. Document Released: 08/18/2005 Document Revised: 08/26/2016 Document Reviewed: 08/26/2016 Elsevier Interactive Patient Education  2017 [employer] Inc.  Fluid Restriction Some health conditions may require you to restrict your fluid intake. This means that you need to limit the amount of fluid you drink each day. When you have a fluid restriction, you must carefully measure and keep track of the amount of fluid you drink. Your health care provider will identify the specific amount of fluid you are allowed each day. This amount may depend on several  things, such as:  The amount of urine you produce in a day.  How much fluid you are keeping (retaining) in your body.  Your blood pressure. What is my plan? Your health care provider recommends that you limit your fluid intake to _1500-1700 mg_ per day. What counts toward my fluid intake? Your fluid intake includes all liquids that you drink, as well as any foods that become liquid at room temperature. The following are examples of some fluids that you will have to restrict:  Tea, coffee, soda, lemonade, milk, water, juice, sport drinks, and nutritional supplement beverages.  Alcoholic beverages.  Cream.  Gravy.  Ice cubes.  Soup and broth. The following are examples of foods that become liquid at room temperature. These foods will also count toward your fluid intake.  Ice cream and ice milk.  Frozen yogurt and sherbet.  Frozen ice pops.  Flavored gelatin. How do I keep track of my fluid intake? Each morning, fill a jug with the amount of water that equals the amount of fluid you are allowed for the day. You can use this water as a guideline for fluid allowance. Each time you take in any form  of fluid, including ice cubes and foods that become liquid at room temperature, pour an equal amount of water out of the container. This helps you to see how much fluid you are taking in. It also helps you to see how much of your fluid intake is left for the rest of the day. The following conversions may also be helpful in measuring your fluid intake:  1 cup equals 8 oz (240 mL).   cup equals 6 oz (180 mL).  ? cup equals 5? oz (160 mL).   cup equals 4 oz (120 mL).  ? cup equals 2? oz (80 mL).   cup equals 2 oz (60 mL).  2 Tbsp equals 1 oz (30 mL). What home care instructions should I follow while restricting fluids?  Make sure that you stay within the recommended limit each day. Always measure and keep track of your fluids, as well as any foods that turn liquid at room  temperature.  Use small cups and glasses and learn to sip fluids slowly.  Add a slice of fresh lemon or lemon juice to water or ice. This helps to satisfy your thirst.  Freeze fruit juice or water in an ice cube tray. Use this as part of your fluid allowance. These cubes are useful for quenching your thirst. Measure the amount of liquid in each ice cube prior to freezing so you can subtract this amount from your day's allowance when you consume each frozen cube.  Try frozen fruits between meals, such as grapes or strawberries.  Swallow your pills along with meals or soft foods, such as applesauce or mashed potatoes. This helps you to save your fluid allowance for something that you enjoy.  Weigh yourself every day. Keeping track of your daily weight can help you and your health care provider to notice as soon as possible if you are retaining too much fluid in your body.  Weigh yourself every morning after you urinate but before you eat breakfast.  Wear the same amount of clothing each time you weigh yourself.  Write down your daily weight. Give this weight record to your health care provider. If your weight is going up, you may be retaining too much fluid. Every 2 cups (480 mL) of fluid retained in the body becomes an extra 1 lb (0.45 kg) of body weight.  Avoid salty foods. These foods make you thirsty and make fluid control more difficult.  Brush your teeth often or rinse your mouth with mouthwash to help your dry mouth. Lemon wedges, hard sour candies, chewing gum, or breath spray may also help to moisten your mouth.  Keep the temperature in your home at a cooler level. Dry air increases thirst, so keep the air in your home as humid as possible.  Avoid being out in the hot sun, which can cause you to sweat and become thirsty. What are some signs that I may be taking in too much fluid? You may be taking in too much fluid if:  Your weight increases. Contact your health care provider if  your weight increases 3 lb or more in a day or if it increases 5 lb or more in a week.  Your face, hands, legs, feet, and belly (abdomen) start to swell.  You have trouble breathing. This information is not intended to replace advice given to you by your health care provider. Make sure you discuss any questions you have with your health care provider. Document Released: 06/15/2007 Document Revised: 01/24/2016 Document Reviewed:  01/17/2014 Elsevier Interactive Patient Education  2017 [employer].

## 2016-12-10 ENCOUNTER — Telehealth (HOSPITAL_COMMUNITY): Payer: Self-pay | Admitting: *Deleted

## 2016-12-10 NOTE — Telephone Encounter (Signed)
Pre cert approved through [facility] for [facility]. [id number]

## 2016-12-15 ENCOUNTER — Ambulatory Visit (HOSPITAL_COMMUNITY)
Admission: RE | Admit: 2016-12-15 | Discharge: 2016-12-15 | Disposition: A | Discharge status: Home / Self Care | Payer: Medicare HMO | Source: Ambulatory Visit | Attending: Cardiology | Admitting: Cardiology

## 2016-12-15 ENCOUNTER — Encounter (HOSPITAL_COMMUNITY): Payer: Self-pay | Admitting: Cardiology

## 2016-12-15 ENCOUNTER — Encounter (HOSPITAL_COMMUNITY)
Admission: RE | Disposition: A | Discharge status: Home / Self Care | Payer: Self-pay | Source: Ambulatory Visit | Attending: Cardiology

## 2016-12-15 DIAGNOSIS — G4733 Obstructive sleep apnea (adult) (pediatric): Secondary | ICD-10-CM | POA: Diagnosis not present

## 2016-12-15 DIAGNOSIS — Z7901 Long term (current) use of anticoagulants: Secondary | ICD-10-CM | POA: Insufficient documentation

## 2016-12-15 DIAGNOSIS — E039 Hypothyroidism, unspecified: Secondary | ICD-10-CM | POA: Diagnosis not present

## 2016-12-15 DIAGNOSIS — N183 Chronic kidney disease, stage 3 (moderate): Secondary | ICD-10-CM | POA: Diagnosis not present

## 2016-12-15 DIAGNOSIS — I5032 Chronic diastolic (congestive) heart failure: Secondary | ICD-10-CM | POA: Diagnosis not present

## 2016-12-15 DIAGNOSIS — E785 Hyperlipidemia, unspecified: Secondary | ICD-10-CM | POA: Insufficient documentation

## 2016-12-15 DIAGNOSIS — I080 Rheumatic disorders of both mitral and aortic valves: Secondary | ICD-10-CM | POA: Diagnosis not present

## 2016-12-15 DIAGNOSIS — I2721 Secondary pulmonary arterial hypertension: Secondary | ICD-10-CM | POA: Diagnosis not present

## 2016-12-15 DIAGNOSIS — I272 Pulmonary hypertension, unspecified: Secondary | ICD-10-CM | POA: Diagnosis not present

## 2016-12-15 DIAGNOSIS — I130 Hypertensive heart and chronic kidney disease with heart failure and stage 1 through stage 4 chronic kidney disease, or unspecified chronic kidney disease: Secondary | ICD-10-CM | POA: Insufficient documentation

## 2016-12-15 DIAGNOSIS — I482 Chronic atrial fibrillation: Secondary | ICD-10-CM | POA: Insufficient documentation

## 2016-12-15 HISTORY — PX: RIGHT HEART CATH: CATH118263

## 2016-12-15 LAB — POCT I-STAT 3, VENOUS BLOOD GAS (G3P V)
ACID-BASE EXCESS: 5 mmol/L — AB (ref 0.0–2.0)
Acid-Base Excess: 5 mmol/L — ABNORMAL HIGH (ref 0.0–2.0)
Bicarbonate: 31.7 mmol/L — ABNORMAL HIGH (ref 20.0–28.0)
Bicarbonate: 32 mmol/L — ABNORMAL HIGH (ref 20.0–28.0)
O2 SAT: 68 %
O2 Saturation: 69 %
PH VEN: 7.37 (ref 7.250–7.430)
PO2 VEN: 38 mmHg (ref 32.0–45.0)
PO2 VEN: 38 mmHg (ref 32.0–45.0)
TCO2: 33 mmol/L (ref 0–100)
TCO2: 34 mmol/L (ref 0–100)
pCO2, Ven: 55.5 mmHg (ref 44.0–60.0)
pCO2, Ven: 56.4 mmHg (ref 44.0–60.0)
pH, Ven: 7.357 (ref 7.250–7.430)

## 2016-12-15 LAB — BASIC METABOLIC PANEL
Anion gap: 6 (ref 5–15)
BUN: 17 mg/dL (ref 6–20)
CHLORIDE: 100 mmol/L — AB (ref 101–111)
CO2: 33 mmol/L — ABNORMAL HIGH (ref 22–32)
CREATININE: 1.04 mg/dL — AB (ref 0.44–1.00)
Calcium: 9.5 mg/dL (ref 8.9–10.3)
GFR calc non Af Amer: 49 mL/min — ABNORMAL LOW (ref >60)
GFR, EST AFRICAN AMERICAN: 56 mL/min — AB (ref >60)
Glucose, Bld: 96 mg/dL (ref 65–99)
POTASSIUM: 3.9 mmol/L (ref 3.5–5.1)
SODIUM: 139 mmol/L (ref 135–145)

## 2016-12-15 LAB — PROTIME-INR
INR: 1.41
PROTHROMBIN TIME: 17.4 s — AB (ref 11.4–15.2)

## 2016-12-15 SURGERY — RIGHT HEART CATH
Anesthesia: LOCAL

## 2016-12-15 MED ORDER — SODIUM CHLORIDE 0.9 % IV SOLN: 250.0000 mL | INTRAVENOUS | Status: DC | PRN

## 2016-12-15 MED ORDER — SODIUM CHLORIDE 0.9 % IV SOLN: INTRAVENOUS | Status: DC

## 2016-12-15 MED ORDER — LIDOCAINE HCL (PF) 1 % IJ SOLN
INTRAMUSCULAR | Status: AC
  Filled 2016-12-15: qty 30

## 2016-12-15 MED ORDER — SODIUM CHLORIDE 0.9% FLUSH: 3.0000 mL | Freq: Two times a day (BID) | INTRAVENOUS | Status: DC

## 2016-12-15 MED ORDER — ACETAMINOPHEN 325 MG PO TABS: 650.0000 mg | ORAL_TABLET | ORAL | Status: DC | PRN

## 2016-12-15 MED ORDER — LIDOCAINE HCL (PF) 1 % IJ SOLN
INTRAMUSCULAR | Status: DC | PRN
  Administered 2016-12-15: 2 mL

## 2016-12-15 MED ORDER — ASPIRIN 81 MG PO CHEW: 81.0000 mg | CHEWABLE_TABLET | ORAL | Status: DC

## 2016-12-15 MED ORDER — ASPIRIN 81 MG PO CHEW
CHEWABLE_TABLET | ORAL | Status: AC
  Administered 2016-12-15: 07:00:00
  Filled 2016-12-15: qty 1

## 2016-12-15 MED ORDER — SODIUM CHLORIDE 0.9% FLUSH: 3.0000 mL | INTRAVENOUS | Status: DC | PRN

## 2016-12-15 MED ORDER — ONDANSETRON HCL 4 MG/2ML IJ SOLN: 4.0000 mg | Freq: Four times a day (QID) | INTRAMUSCULAR | Status: DC | PRN

## 2016-12-15 SURGICAL SUPPLY — 7 items
CATH BALLN WEDGE 5F 110CM (CATHETERS) ×1 IMPLANT
KIT HEART RIGHT NAMIC (KITS) ×1 IMPLANT
PACK CARDIAC CATHETERIZATION (CUSTOM PROCEDURE TRAY) ×2 IMPLANT
SHEATH GLIDE SLENDER 4/5FR (SHEATH) ×1 IMPLANT
TRANSDUCER W/STOPCOCK (MISCELLANEOUS) ×2 IMPLANT
TUBING ART PRESS 72  MALE/FEM (TUBING) ×1
TUBING ART PRESS 72 MALE/FEM (TUBING) IMPLANT

## 2016-12-15 NOTE — Discharge Instructions (Signed)
Angiogram, Care After °This sheet gives you information about how to care for yourself after your procedure. Your health care provider may also give you more specific instructions. If you have problems or questions, contact your health care provider. °What can I expect after the procedure? °After the procedure, it is common to have bruising and tenderness at the catheter insertion area. °Follow these instructions at home: °Insertion site care  °· Follow instructions from your health care provider about how to take care of your insertion site. Make sure you: °¨ Wash your hands with soap and water before you change your bandage (dressing). If soap and water are not available, use hand sanitizer. °¨ Change your dressing as told by your health care provider. °¨ Leave stitches (sutures), skin glue, or adhesive strips in place. These skin closures may need to stay in place for 2 weeks or longer. If adhesive strip edges start to loosen and curl up, you may trim the loose edges. Do not remove adhesive strips completely unless your health care provider tells you to do that. °· Do not take baths, swim, or use a hot tub until your health care provider approves. °· You may shower 24-48 hours after the procedure or as told by your health care provider. °¨ Gently wash the site with plain soap and water. °¨ Pat the area dry with a clean towel. °¨ Do not rub the site. This may cause bleeding. °· Do not apply powder or lotion to the site. Keep the site clean and dry. °· Check your insertion site every day for signs of infection. Check for: °¨ Redness, swelling, or pain. °¨ Fluid or blood. °¨ Warmth. °¨ Pus or a bad smell. °Activity  °· Rest as told by your health care provider, usually for 1-2 days. °· Do not lift anything that is heavier than 10 lbs. (4.5 kg) or as told by your health care provider. °· Do not drive for 24 hours if you were given a medicine to help you relax (sedative). °· Do not drive or use heavy machinery while  taking prescription pain medicine. °General instructions  °· Return to your normal activities as told by your health care provider, usually in about a week. Ask your health care provider what activities are safe for you. °· If the catheter site starts bleeding, lie flat and put pressure on the site. If the bleeding does not stop, get help right away. This is a medical emergency. °· Drink enough fluid to keep your urine clear or pale yellow. This helps flush the contrast dye from your body. °· Take over-the-counter and prescription medicines only as told by your health care provider. °· Keep all follow-up visits as told by your health care provider. This is important. °Contact a health care provider if: °· You have a fever or chills. °· You have redness, swelling, or pain around your insertion site. °· You have fluid or blood coming from your insertion site. °· The insertion site feels warm to the touch. °· You have pus or a bad smell coming from your insertion site. °· You have bruising around the insertion site. °· You notice blood collecting in the tissue around the catheter site (hematoma). The hematoma may be painful to the touch. °Get help right away if: °· You have severe pain at the catheter insertion area. °· The catheter insertion area swells very fast. °· The catheter insertion area is bleeding, and the bleeding does not stop when you hold steady pressure on   the area. °· The area near or just beyond the catheter insertion site becomes pale, cool, tingly, or numb. °These symptoms may represent a serious problem that is an emergency. Do not wait to see if the symptoms will go away. Get medical help right away. Call your local emergency services (911 in the [place] Do not drive yourself to the hospital. °Summary °· After the procedure, it is common to have bruising and tenderness at the catheter insertion area. °· After the procedure, it is important to rest and drink plenty of fluids. °· Do not take baths,  swim, or use a hot tub until your health care provider says it is okay to do so. You may shower 24-48 hours after the procedure or as told by your health care provider. °· If the catheter site starts bleeding, lie flat and put pressure on the site. If the bleeding does not stop, get help right away. This is a medical emergency. °This information is not intended to replace advice given to you by your health care provider. Make sure you discuss any questions you have with your health care provider. °Document Released: 03/06/2005 Document Revised: 07/23/2016 Document Reviewed: 07/23/2016 °Elsevier Interactive Patient Education © 2017 [employer] ° °

## 2016-12-15 NOTE — Interval H&P Note (Signed)
History and Physical Interval Note:  12/15/2016 9:43 AM  Sandra Merritt  has presented today for surgery, with the diagnosis of pulm htn  The various methods of treatment have been discussed with the patient and family. After consideration of risks, benefits and other options for treatment, the patient has consented to  Procedure(s): Right Heart Cath (N/A) as a surgical intervention .  The patient's history has been reviewed, patient examined, no change in status, stable for surgery.  I have reviewed the patient's chart and labs.  Questions were answered to the patient's satisfaction.     [place] Sarinana [employer]

## 2016-12-15 NOTE — H&P (View-Only) (Signed)
PCP: Dr. Darcus Austin Primary Cardiology: Dr. Radford Pax HF Cardiology: Dr. Aundra Dubin  83 yo with history of permanent atrial fibrillation and diastolic CHF presents for CHF clinic evaluation.  She has been followed by Dr. Radford Pax.  She has had atrial fibrillation since 2002, cardioversion was unsuccessful at that time.  She has done reasonably well over the years, but more recently has developed progressive exercise intolerance.  She is limited by knee arthritis and low back pain, but clearly has become more short of breath over the last 6 months. She is not very active.  She is short of breath walking up any stairs. She is short of breath dressing.  She is short of breath walking longer distances around her house.  No orthopnea/PND.  No chest pain.  She does not feel palpitations and her rate is controlled.  She is taking a low dose of Lasix, 20 mg daily.  On warfarin, no BRPBR or melena. There was concern for significant pulmonary hypertension on her most recent echo, so she was referred here by Dr. Radford Pax for evaluation.    ECG (1/18, personally reviewed): atrial fibrillation, PVC  Labs (1/18): creatinine 1.06  PMH: 1. Atrial fibrillation: Permanent. Present since 2002.  2. H/o cholecystectomy  3. Hypothyroidism 4. HTN 5. Hyperlipidemia 6. OSA: Cannot tolerate CPAP.  7. CKD stage III 8. Chronic diastolic CHF: Echo (1/19) with EF 50-55%, moderate to severe LVH, moderate AS (AVA 1.0 cm^2, mean gradient 21 mmHg), mild AI, mild RV dilation with moderately decreased RV systolic function, moderate to severe TR, PASP 69 mmHg, moderate mitral regurgitation.  9. Aortic stenosis: Moderate on last echo in 2/18.   Social History   Social History  . Marital status: Married    Spouse name: N/A  . Number of children: 3  . Years of education: HS   Occupational History  . Not on file.   Social History Main Topics  . Smoking status: Never Smoker  . Smokeless tobacco: Never Used  . Alcohol use No  . Drug  use: No  . Sexual activity: Not on file   Other Topics Concern  . Not on file   Social History Narrative   Lives at home with two daughters and her friend.   Left-handed.   No caffeine use.   Family History  Problem Relation Age of Onset  . Heart disease Mother   . Heart disease Father   . Heart disease Sister   . Diabetes Sister    ROS: All systems reviewed and negative except as per HPI.  Current Outpatient Prescriptions  Medication Sig Dispense Refill  . acetaminophen (TYLENOL) 500 MG tablet Take 500 mg by mouth as needed.    Marland Kitchen alendronate (FOSAMAX) 70 MG tablet Take 70 mg by mouth once a week.  11  . ALPRAZolam (XANAX) 0.5 MG tablet As needed before MRI 3 tablet 0  . Calcium Carb-Cholecalciferol (CALCIUM + D3) 600-200 MG-UNIT TABS Take 2 tablets by mouth 2 (two) times daily.    . furosemide (LASIX) 40 MG tablet Take 1 tablet (40 mg total) by mouth daily. 90 tablet 3  . gabapentin (NEURONTIN) 100 MG capsule Take 1 capsule (100 mg total) by mouth 3 (three) times daily. 270 capsule 4  . glucosamine-chondroitin 500-400 MG tablet Take 1 tablet by mouth 3 (three) times daily.    Marland Kitchen levothyroxine (SYNTHROID, LEVOTHROID) 137 MCG tablet Take 137 mcg by mouth daily before breakfast.    . lovastatin (MEVACOR) 40 MG tablet Take 40 mg  by mouth at bedtime.    . metoprolol succinate (TOPROL-XL) 25 MG 24 hr tablet Take 25 mg by mouth daily.    . Multiple Vitamin (MULTIVITAMIN) tablet Take 1 tablet by mouth daily.    . Multiple Vitamins-Minerals (EYE VITAMINS PO) Take by mouth 2 (two) times daily.    . ramipril (ALTACE) 10 MG capsule Take 1 capsule (10 mg total) by mouth daily. 90 capsule 3  . warfarin (COUMADIN) 5 MG tablet Take 1 tablet (5 mg total) by mouth as directed. 90 tablet 1   No current facility-administered medications for this encounter.    BP 128/77   Pulse 71   Wt 233 lb 12 oz (106 kg)   SpO2 97%   BMI 37.73 kg/m  General: NAD Neck: JVP 10-12 cm, no thyromegaly or  thyroid nodule.  Lungs: Clear to auscultation bilaterally with normal respiratory effort. CV: Nondisplaced PMI.  Heart irregular S1/S2, no S3/S4, 3/6 crescendo-decrescendo systolic murmur RUSB with clear S2.  1+ edema to knees bilaterally.  No carotid bruit.  Normal pedal pulses.  Abdomen: Soft, nontender, no hepatosplenomegaly, no distention.  Skin: Intact without lesions or rashes.  Neurologic: Alert and oriented x 3.  Psych: Normal affect. Extremities: No clubbing or cyanosis.  HEENT: Normal.   Assessment/Plan: 1. Atrial fibrillation: Permanent.  Adequate rate control on current Toprol XL.  She is on warfarin with no problems.  2. Chronic diastolic CHF: With prominent RV failure noted on most recent echo.  It is very possible that her elevated PA pressure by echo is due to diastolic CHF with elevated left atrial pressure. On exam, she is volume overloaded.  She has NYHA class III symptoms.   - Increase Lasix to 40 mg daily.  Check BMET/BNP today and again in 10 days.  She may need more diuretic than this, but will see how she does with this increase.  - Needs to cut back on sodium in diet, recommended ideal 1500 mg/day sodium diet and to keep fluid intake < 2 L/day.  3. Aortic stenosis: I reviewed her prior echo.  I think that the aortic stenosis is indeed moderate (not severe).  Mean gradient is 21 mmHg and I recalculated the AVA at 1.0 cm^2.  4. Pulmonary hypertension: As above, suspect this is most likely group 2 PH due to LV diastolic dysfunction/elevated LA pressure.  However, I do think that it is reasonable to rule out component of group 1 PH/pulmonary arterial hypertension.   - I will arrange for RHC => we discussed risks/benefits and she agrees to proceed.  Will plan on doing this next week.  She will hold warfarin x 2 days prior.   Loralie Champagne 12/08/2016

## 2016-12-15 NOTE — Progress Notes (Signed)
Dr Aundra Dubin confirmed pt to be dc in 1 hour

## 2016-12-29 ENCOUNTER — Ambulatory Visit (INDEPENDENT_AMBULATORY_CARE_PROVIDER_SITE_OTHER): Payer: Medicare HMO | Admitting: *Deleted

## 2016-12-29 DIAGNOSIS — I482 Chronic atrial fibrillation: Secondary | ICD-10-CM

## 2016-12-29 DIAGNOSIS — I4821 Permanent atrial fibrillation: Secondary | ICD-10-CM

## 2016-12-29 DIAGNOSIS — Z5181 Encounter for therapeutic drug level monitoring: Secondary | ICD-10-CM

## 2016-12-29 LAB — POCT INR: INR: 1.8

## 2016-12-30 ENCOUNTER — Ambulatory Visit (HOSPITAL_COMMUNITY)
Admission: RE | Admit: 2016-12-30 | Discharge: 2016-12-30 | Disposition: A | Discharge status: Home / Self Care | Payer: Medicare HMO | Source: Ambulatory Visit | Attending: Cardiology | Admitting: Cardiology

## 2016-12-30 VITALS — BP 113/63 | HR 71 | Wt 213.0 lb

## 2016-12-30 DIAGNOSIS — I272 Pulmonary hypertension, unspecified: Secondary | ICD-10-CM

## 2016-12-30 DIAGNOSIS — G4733 Obstructive sleep apnea (adult) (pediatric): Secondary | ICD-10-CM | POA: Diagnosis not present

## 2016-12-30 DIAGNOSIS — E785 Hyperlipidemia, unspecified: Secondary | ICD-10-CM | POA: Diagnosis not present

## 2016-12-30 DIAGNOSIS — Z9049 Acquired absence of other specified parts of digestive tract: Secondary | ICD-10-CM | POA: Insufficient documentation

## 2016-12-30 DIAGNOSIS — I130 Hypertensive heart and chronic kidney disease with heart failure and stage 1 through stage 4 chronic kidney disease, or unspecified chronic kidney disease: Secondary | ICD-10-CM | POA: Insufficient documentation

## 2016-12-30 DIAGNOSIS — I4821 Permanent atrial fibrillation: Secondary | ICD-10-CM

## 2016-12-30 DIAGNOSIS — I350 Nonrheumatic aortic (valve) stenosis: Secondary | ICD-10-CM | POA: Insufficient documentation

## 2016-12-30 DIAGNOSIS — I482 Chronic atrial fibrillation: Secondary | ICD-10-CM | POA: Insufficient documentation

## 2016-12-30 DIAGNOSIS — N183 Chronic kidney disease, stage 3 (moderate): Secondary | ICD-10-CM | POA: Diagnosis not present

## 2016-12-30 DIAGNOSIS — E039 Hypothyroidism, unspecified: Secondary | ICD-10-CM | POA: Diagnosis not present

## 2016-12-30 DIAGNOSIS — Z7901 Long term (current) use of anticoagulants: Secondary | ICD-10-CM | POA: Diagnosis not present

## 2016-12-30 DIAGNOSIS — I5032 Chronic diastolic (congestive) heart failure: Secondary | ICD-10-CM | POA: Insufficient documentation

## 2016-12-30 LAB — BASIC METABOLIC PANEL
ANION GAP: 5 (ref 5–15)
BUN: 38 mg/dL — AB (ref 6–20)
CHLORIDE: 101 mmol/L (ref 101–111)
CO2: 30 mmol/L (ref 22–32)
Calcium: 9.6 mg/dL (ref 8.9–10.3)
Creatinine, Ser: 1.22 mg/dL — ABNORMAL HIGH (ref 0.44–1.00)
GFR calc Af Amer: 46 mL/min — ABNORMAL LOW (ref >60)
GFR, EST NON AFRICAN AMERICAN: 40 mL/min — AB (ref >60)
Glucose, Bld: 88 mg/dL (ref 65–99)
POTASSIUM: 4.8 mmol/L (ref 3.5–5.1)
Sodium: 136 mmol/L (ref 135–145)

## 2016-12-30 LAB — BRAIN NATRIURETIC PEPTIDE: B NATRIURETIC PEPTIDE 5: 281.6 pg/mL — AB (ref 0.0–100.0)

## 2016-12-30 NOTE — Patient Instructions (Signed)
Routine lab work today. Will notify you of abnormal results  Follow up with Dr.McLean in 1 month.

## 2016-12-31 ENCOUNTER — Encounter (HOSPITAL_COMMUNITY): Payer: Self-pay | Admitting: *Deleted

## 2016-12-31 ENCOUNTER — Telehealth (HOSPITAL_COMMUNITY): Payer: Self-pay | Admitting: *Deleted

## 2016-12-31 MED ORDER — FUROSEMIDE 40 MG PO TABS: ORAL_TABLET | ORAL | Status: DC

## 2016-12-31 NOTE — Telephone Encounter (Signed)
-----   Message from Larey Dresser, MD sent at 12/31/2016  9:45 AM EDT ----- Stop KCl.  Decrease Lasix to 40 qam/20 qpm.  BMET 2 wks.

## 2016-12-31 NOTE — Telephone Encounter (Signed)
Notes recorded by Harvie Junior, CMA on 12/31/2016 at 2:20 PM EDT Pt aware of results and medication changes. Added to lab schedule 5/16 ------  Notes recorded by Larey Dresser, MD on 12/31/2016 at 9:45 AM EDT Stop KCl. Decrease Lasix to 40 qam/20 qpm. BMET 2 wks.    Ref Range & Units 1d ago 2wk ago 3wk ago   Sodium 135 - 145 mmol/L 136  139  139    Potassium 3.5 - 5.1 mmol/L 4.8  3.9  4.6    Chloride 101 - 111 mmol/L 101  100   99     CO2 22 - 32 mmol/L 30  33   31    Glucose, Bld 65 - 99 mg/dL 88  96  90    BUN 6 - 20 mg/dL 38   17  17    Creatinine, Ser 0.44 - 1.00 mg/dL 1.22   1.04   0.90    Calcium 8.9 - 10.3 mg/dL 9.6  9.5  9.4    GFR calc non Af Amer >60 mL/min 40   49   58     GFR calc Af Amer >60 mL/min 46   56CM   >60CM

## 2017-01-01 NOTE — Progress Notes (Signed)
PCP: Dr. Darcus Austin Primary Cardiology: Dr. Radford Pax HF Cardiology: Dr. Aundra Dubin  83 yo with history of permanent atrial fibrillation and diastolic CHF presents for CHF clinic followup.  She has been followed by Dr. Radford Pax.  She has had atrial fibrillation since 2002, cardioversion was unsuccessful at that time.  She has done reasonably well over the years, but more recently developed progressive exercise intolerance.  She is limited by knee arthritis and low back pain, but clearly became more short of breath over the last 6 months. She is not very active.  When I saw her initially, she was short of breath walking up any stairs. She was short of breath dressing.  She was short of breath walking longer distances around her house.  No orthopnea/PND.  No chest pain.    I increased her Lasix, now on 40 mg bid.  RHC showed elevated right and left heart filling pressures and pulmonary venous hypertension.  Today, her weight is down 20 lbs.  She feels much better.  She was able to walk in today (used a wheelchair last appt).  No more coughing.  No dyspnea walking on flat ground.      Labs (1/18): creatinine 1.06 Labs (4/18): K 3.9, creatinine 1.04  PMH: 1. Atrial fibrillation: Permanent. Present since 2002.  2. H/o cholecystectomy  3. Hypothyroidism 4. HTN 5. Hyperlipidemia 6. OSA: Cannot tolerate CPAP.  7. CKD stage III 8. Chronic diastolic CHF: Echo (9/56) with EF 50-55%, moderate to severe LVH, moderate AS (AVA 1.0 cm^2, mean gradient 21 mmHg), mild AI, mild RV dilation with moderately decreased RV systolic function, moderate to severe TR, PASP 69 mmHg, moderate mitral regurgitation.  - RHC (4/18): mean RA 14, PA 61/24 mean 41, mean PCWP 28, CI 3.48, PVR 1.8 WU.  9. Aortic stenosis: Moderate on last echo in 2/18.   Social History   Social History  . Marital status: Married    Spouse name: N/A  . Number of children: 3  . Years of education: HS   Occupational History  . Not on file.   Social  History Main Topics  . Smoking status: Never Smoker  . Smokeless tobacco: Never Used  . Alcohol use No  . Drug use: No  . Sexual activity: Not on file   Other Topics Concern  . Not on file   Social History Narrative   Lives at home with two daughters and her friend.   Left-handed.   No caffeine use.   Family History  Problem Relation Age of Onset  . Heart disease Mother   . Heart disease Father   . Heart disease Sister   . Diabetes Sister    ROS: All systems reviewed and negative except as per HPI.  Current Outpatient Prescriptions  Medication Sig Dispense Refill  . acetaminophen (TYLENOL) 500 MG tablet Take 1,000-2,000 mg by mouth 2 (two) times daily as needed for moderate pain or headache.     . alendronate (FOSAMAX) 70 MG tablet Take 70 mg by mouth every Wednesday.   11  . Calcium Carb-Cholecalciferol (CALCIUM + D3) 600-200 MG-UNIT TABS Take 1 tablet by mouth in the morning and 2 tablets at night    . dextromethorphan (DELSYM) 30 MG/5ML liquid Take 30 mg by mouth as needed for cough.    . gabapentin (NEURONTIN) 100 MG capsule Take 1 capsule (100 mg total) by mouth 3 (three) times daily. (Patient taking differently: Take 100 mg by mouth 3 (three) times daily as needed (pain). )  270 capsule 4  . glucosamine-chondroitin 500-400 MG tablet Take 2 tablets in the morning and 1 tablet at night    . levothyroxine (SYNTHROID, LEVOTHROID) 137 MCG tablet Take 137 mcg by mouth daily before breakfast.    . loratadine (CLARITIN) 10 MG tablet Take 10 mg by mouth daily as needed for allergies.    Marland Kitchen lovastatin (MEVACOR) 40 MG tablet Take 40 mg by mouth at bedtime.    . metoprolol succinate (TOPROL-XL) 25 MG 24 hr tablet Take 25 mg by mouth daily.    . Multiple Vitamin (MULTIVITAMIN) tablet Take 1 tablet by mouth daily.    . Multiple Vitamins-Minerals (EYE VITAMINS PO) Take 1 capsule by mouth 2 (two) times daily.     . Polyvinyl Alcohol-Povidone (REFRESH OP) Apply 1 drop to eye daily.    .  ramipril (ALTACE) 10 MG capsule Take 1 capsule (10 mg total) by mouth daily. 90 capsule 3  . warfarin (COUMADIN) 5 MG tablet Take 1 tablet (5 mg total) by mouth as directed. (Patient taking differently: Take 2.5mg  daily on Tues and Thurs and 5mg s daily on Mon, Wed, Fri, Sat, and Sun) 90 tablet 1  . furosemide (LASIX) 40 MG tablet Take 40mg  in the AM and 20mg  in the PM 45 tablet    No current facility-administered medications for this encounter.    BP 113/63   Pulse 71   Wt 213 lb (96.6 kg)   SpO2 98%   BMI 36.85 kg/m  General: NAD Neck: JVP 8 cm, no thyromegaly or thyroid nodule.  Lungs: Clear to auscultation bilaterally with normal respiratory effort. CV: Nondisplaced PMI.  Heart irregular S1/S2, no S3/S4, 3/6 crescendo-decrescendo systolic murmur RUSB with clear S2.  Trace ankle edema.  No carotid bruit.  Normal pedal pulses.  Abdomen: Soft, nontender, no hepatosplenomegaly, no distention.  Skin: Intact without lesions or rashes.  Neurologic: Alert and oriented x 3.  Psych: Normal affect. Extremities: No clubbing or cyanosis.  HEENT: Normal.   Assessment/Plan: 1. Atrial fibrillation: Permanent.  Adequate rate control on current Toprol XL.  She is on warfarin with no problems.  2. Chronic diastolic CHF: With prominent RV failure noted on most recent echo. RHC showed pulmonary venous hypertension with elevated left and right heart filling pressures.  With diuresis, she is now down 20 lbs and feels much better.  She does not appear significantly volume overloaded today.    - Continue Lasix 40 mg bid but will check BMET today.  - Needs to continue to follow low sodium diet.   3. Aortic stenosis: I reviewed her prior echo.  I think that the aortic stenosis is indeed moderate (not severe).  Mean gradient is 21 mmHg and I recalculated the AVA at 1.0 cm^2.  4. Pulmonary hypertension: She has group 2 PH due to LV diastolic dysfunction/elevated LA pressure (pulmonary venous hypertension).    Loralie Champagne 01/01/2017

## 2017-01-13 ENCOUNTER — Ambulatory Visit (INDEPENDENT_AMBULATORY_CARE_PROVIDER_SITE_OTHER): Payer: Medicare HMO | Admitting: *Deleted

## 2017-01-13 DIAGNOSIS — Z5181 Encounter for therapeutic drug level monitoring: Secondary | ICD-10-CM

## 2017-01-13 DIAGNOSIS — I482 Chronic atrial fibrillation: Secondary | ICD-10-CM

## 2017-01-13 DIAGNOSIS — I4821 Permanent atrial fibrillation: Secondary | ICD-10-CM

## 2017-01-13 LAB — POCT INR: INR: 3

## 2017-01-14 ENCOUNTER — Ambulatory Visit (HOSPITAL_COMMUNITY)
Admission: RE | Admit: 2017-01-14 | Discharge: 2017-01-14 | Disposition: A | Discharge status: Home / Self Care | Payer: Medicare HMO | Source: Ambulatory Visit | Attending: Internal Medicine | Admitting: Internal Medicine

## 2017-01-14 DIAGNOSIS — I272 Pulmonary hypertension, unspecified: Secondary | ICD-10-CM | POA: Insufficient documentation

## 2017-01-14 LAB — BASIC METABOLIC PANEL
Anion gap: 5 (ref 5–15)
BUN: 28 mg/dL — AB (ref 6–20)
CALCIUM: 9.1 mg/dL (ref 8.9–10.3)
CO2: 32 mmol/L (ref 22–32)
Chloride: 103 mmol/L (ref 101–111)
Creatinine, Ser: 1.18 mg/dL — ABNORMAL HIGH (ref 0.44–1.00)
GFR calc Af Amer: 48 mL/min — ABNORMAL LOW (ref >60)
GFR calc non Af Amer: 42 mL/min — ABNORMAL LOW (ref >60)
GLUCOSE: 98 mg/dL (ref 65–99)
Potassium: 4.2 mmol/L (ref 3.5–5.1)
Sodium: 140 mmol/L (ref 135–145)

## 2017-02-02 DIAGNOSIS — R740 Nonspecific elevation of levels of transaminase and lactic acid dehydrogenase [LDH]: Secondary | ICD-10-CM | POA: Diagnosis not present

## 2017-02-03 ENCOUNTER — Ambulatory Visit (INDEPENDENT_AMBULATORY_CARE_PROVIDER_SITE_OTHER): Payer: Medicare HMO | Admitting: *Deleted

## 2017-02-03 DIAGNOSIS — I482 Chronic atrial fibrillation: Secondary | ICD-10-CM

## 2017-02-03 DIAGNOSIS — I4821 Permanent atrial fibrillation: Secondary | ICD-10-CM

## 2017-02-03 DIAGNOSIS — Z5181 Encounter for therapeutic drug level monitoring: Secondary | ICD-10-CM | POA: Diagnosis not present

## 2017-02-03 LAB — POCT INR: INR: 3.5

## 2017-02-16 ENCOUNTER — Ambulatory Visit (HOSPITAL_COMMUNITY)
Admission: RE | Admit: 2017-02-16 | Discharge: 2017-02-16 | Disposition: A | Discharge status: Home / Self Care | Payer: Medicare HMO | Source: Ambulatory Visit | Attending: Internal Medicine | Admitting: Internal Medicine

## 2017-02-16 ENCOUNTER — Encounter (HOSPITAL_COMMUNITY): Payer: Self-pay

## 2017-02-16 VITALS — BP 118/54 | HR 56 | Wt 213.2 lb

## 2017-02-16 DIAGNOSIS — I4821 Permanent atrial fibrillation: Secondary | ICD-10-CM

## 2017-02-16 DIAGNOSIS — Z7901 Long term (current) use of anticoagulants: Secondary | ICD-10-CM | POA: Insufficient documentation

## 2017-02-16 DIAGNOSIS — G4733 Obstructive sleep apnea (adult) (pediatric): Secondary | ICD-10-CM | POA: Insufficient documentation

## 2017-02-16 DIAGNOSIS — Z9049 Acquired absence of other specified parts of digestive tract: Secondary | ICD-10-CM | POA: Diagnosis not present

## 2017-02-16 DIAGNOSIS — E785 Hyperlipidemia, unspecified: Secondary | ICD-10-CM | POA: Diagnosis not present

## 2017-02-16 DIAGNOSIS — N183 Chronic kidney disease, stage 3 (moderate): Secondary | ICD-10-CM | POA: Insufficient documentation

## 2017-02-16 DIAGNOSIS — I350 Nonrheumatic aortic (valve) stenosis: Secondary | ICD-10-CM | POA: Insufficient documentation

## 2017-02-16 DIAGNOSIS — I5032 Chronic diastolic (congestive) heart failure: Secondary | ICD-10-CM | POA: Insufficient documentation

## 2017-02-16 DIAGNOSIS — I272 Pulmonary hypertension, unspecified: Secondary | ICD-10-CM

## 2017-02-16 DIAGNOSIS — E039 Hypothyroidism, unspecified: Secondary | ICD-10-CM | POA: Insufficient documentation

## 2017-02-16 DIAGNOSIS — I482 Chronic atrial fibrillation: Secondary | ICD-10-CM | POA: Insufficient documentation

## 2017-02-16 DIAGNOSIS — I130 Hypertensive heart and chronic kidney disease with heart failure and stage 1 through stage 4 chronic kidney disease, or unspecified chronic kidney disease: Secondary | ICD-10-CM | POA: Insufficient documentation

## 2017-02-16 LAB — BASIC METABOLIC PANEL
ANION GAP: 5 (ref 5–15)
BUN: 25 mg/dL — AB (ref 6–20)
CALCIUM: 9.3 mg/dL (ref 8.9–10.3)
CO2: 30 mmol/L (ref 22–32)
Chloride: 103 mmol/L (ref 101–111)
Creatinine, Ser: 1.14 mg/dL — ABNORMAL HIGH (ref 0.44–1.00)
GFR calc Af Amer: 50 mL/min — ABNORMAL LOW (ref >60)
GFR, EST NON AFRICAN AMERICAN: 44 mL/min — AB (ref >60)
Glucose, Bld: 100 mg/dL — ABNORMAL HIGH (ref 65–99)
POTASSIUM: 4.2 mmol/L (ref 3.5–5.1)
SODIUM: 138 mmol/L (ref 135–145)

## 2017-02-16 MED ORDER — FUROSEMIDE 40 MG PO TABS: ORAL_TABLET | ORAL | 3 refills | Status: DC

## 2017-02-16 NOTE — Patient Instructions (Signed)
Labs today (will call for abnormal results, otherwise no news is good news)  INCREASE lasix to 40 mg (1 Tablet) Twice Daily, alternating with 40 mg (1 Tablet) in the AM and 20 mg (0.5 tablet) in the PM.  Labs in 2 weeks (BMET)  Follow up in 3 Months, we will call you to schedule appointment.

## 2017-02-17 ENCOUNTER — Ambulatory Visit: Payer: Medicare PPO | Admitting: Adult Health

## 2017-02-17 NOTE — Progress Notes (Signed)
PCP: Dr. Darcus Austin Primary Cardiology: Dr. Radford Pax HF Cardiology: Dr. Aundra Dubin  83 yo with history of permanent atrial fibrillation and diastolic CHF presents for CHF clinic followup.  She has been followed by Dr. Radford Pax.  She has had atrial fibrillation since 2002, cardioversion was unsuccessful at that time.  She has done reasonably well over the years, but more recently developed progressive exercise intolerance.  She is limited by knee arthritis and low back pain, but clearly became more short of breath over the last 6 months. She is not very active.  When I saw her initially, she was short of breath walking up any stairs. She was short of breath dressing.  She was short of breath walking longer distances around her house.  No orthopnea/PND.  No chest pain.    I increased her Lasix. RHC showed elevated right and left heart filling pressures and pulmonary venous hypertension.  Weight dropped 20 lbs and I backed off on her Lasix to 40 qam/20 qpm with rise in BUN/creatinine.  She has maintained her weight since last appointment. Walks with 4-pronged cane.  Able to walk into church.  Not short of breath walking around the house or to the car, but not very active.  No falls, no lightheadedness.  No orthopnea/PND.  No chest pain.  Mainly limited by knee and back arthritis pain.   Labs (1/18): creatinine 1.06 Labs (4/18): K 3.9, creatinine 1.04 Labs (5/18): K 4.2, creatinine 1.18  PMH: 1. Atrial fibrillation: Permanent. Present since 2002.  2. H/o cholecystectomy  3. Hypothyroidism 4. HTN 5. Hyperlipidemia 6. OSA: Cannot tolerate CPAP.  7. CKD stage III 8. Chronic diastolic CHF: Echo (8/78) with EF 50-55%, moderate to severe LVH, moderate AS (AVA 1.0 cm^2, mean gradient 21 mmHg), mild AI, mild RV dilation with moderately decreased RV systolic function, moderate to severe TR, PASP 69 mmHg, moderate mitral regurgitation.  - RHC (4/18): mean RA 14, PA 61/24 mean 41, mean PCWP 28, CI 3.48, PVR 1.8 WU.   9. Aortic stenosis: Moderate on last echo in 2/18.   Social History   Social History  . Marital status: Married    Spouse name: N/A  . Number of children: 3  . Years of education: HS   Occupational History  . Not on file.   Social History Main Topics  . Smoking status: Never Smoker  . Smokeless tobacco: Never Used  . Alcohol use No  . Drug use: No  . Sexual activity: Not on file   Other Topics Concern  . Not on file   Social History Narrative   Lives at home with two daughters and her friend.   Left-handed.   No caffeine use.   Family History  Problem Relation Age of Onset  . Heart disease Mother   . Heart disease Father   . Heart disease Sister   . Diabetes Sister    ROS: All systems reviewed and negative except as per HPI.  Current Outpatient Prescriptions  Medication Sig Dispense Refill  . acetaminophen (TYLENOL) 500 MG tablet Take 1,000-2,000 mg by mouth 2 (two) times daily as needed for moderate pain or headache.     . alendronate (FOSAMAX) 70 MG tablet Take 70 mg by mouth every Wednesday.   11  . Calcium Carb-Cholecalciferol (CALCIUM + D3) 600-200 MG-UNIT TABS Take 1 tablet by mouth in the morning and 2 tablets at night    . dextromethorphan (DELSYM) 30 MG/5ML liquid Take 30 mg by mouth as needed for cough.    Marland Kitchen  furosemide (LASIX) 40 MG tablet Take 40 mg (1 Tablet) Twice Daily, alternating with 40 mg (1 Tablet) in the AM and 20 mg (0.5 Tablet) in the PM. 55 tablet 3  . gabapentin (NEURONTIN) 100 MG capsule Take 1 capsule (100 mg total) by mouth 3 (three) times daily. (Patient taking differently: Take 100 mg by mouth 3 (three) times daily as needed (pain). ) 270 capsule 4  . glucosamine-chondroitin 500-400 MG tablet Take 2 tablets in the morning and 1 tablet at night    . levothyroxine (SYNTHROID, LEVOTHROID) 137 MCG tablet Take 137 mcg by mouth daily before breakfast.    . loratadine (CLARITIN) 10 MG tablet Take 10 mg by mouth daily as needed for allergies.     Marland Kitchen lovastatin (MEVACOR) 40 MG tablet Take 40 mg by mouth at bedtime.    . metoprolol succinate (TOPROL-XL) 25 MG 24 hr tablet Take 25 mg by mouth daily.    . Multiple Vitamin (MULTIVITAMIN) tablet Take 1 tablet by mouth daily.    . Multiple Vitamins-Minerals (EYE VITAMINS PO) Take 1 capsule by mouth 2 (two) times daily.     . Polyvinyl Alcohol-Povidone (REFRESH OP) Apply 1 drop to eye daily.    . ramipril (ALTACE) 10 MG capsule Take 1 capsule (10 mg total) by mouth daily. 90 capsule 3  . warfarin (COUMADIN) 5 MG tablet Take 1 tablet (5 mg total) by mouth as directed. (Patient taking differently: Take 2.5mg  daily on Tues and Thurs and 5mg s daily on Mon, Wed, Fri, Sat, and Sun) 90 tablet 1   No current facility-administered medications for this encounter.    BP (!) 118/54   Pulse (!) 56   Wt 213 lb 4 oz (96.7 kg)   SpO2 98%   BMI 36.89 kg/m  General: NAD, obese Neck: JVP 8 cm, no thyromegaly or thyroid nodule.  Lungs: Clear bilaterally CV: Nondisplaced PMI.  Heart irregular S1/S2, no S3/S4, 3/6 crescendo-decrescendo systolic murmur RUSB with clear S2.  1+ ankle edema.  No carotid bruit.  Normal pedal pulses.  Abdomen: Soft, nontender, no hepatosplenomegaly, no distention.  Skin: Intact without lesions or rashes.  Neurologic: Alert and oriented x 3.  Psych: Normal affect. Extremities: No clubbing or cyanosis.  HEENT: Normal.   Assessment/Plan: 1. Atrial fibrillation: Permanent.  Adequate rate control on current Toprol XL.  She is on warfarin with no problems.  2. Chronic diastolic CHF: With prominent RV failure noted on most recent echo. RHC showed pulmonary venous hypertension with elevated left and right heart filling pressures.  With diuresis, she is now down 20 lbs and feels much better.  She appears mildly volume overloaded.   - Increase Lasix to 40 mg bid alternating with 40 qam/20 qpm.  BMET today and again in 2 wks.  - Needs to continue to follow low sodium diet.   3. Aortic  stenosis: I reviewed her prior echo.  I think that the aortic stenosis is indeed moderate (not severe).  Mean gradient is 21 mmHg and I recalculated the AVA at 1.0 cm^2.  4. Pulmonary hypertension: She has group 2 PH due to LV diastolic dysfunction/elevated LA pressure (pulmonary venous hypertension).   Followup in 3 months.   Loralie Champagne 02/17/2017

## 2017-02-18 ENCOUNTER — Other Ambulatory Visit: Payer: Self-pay | Admitting: Cardiology

## 2017-02-18 ENCOUNTER — Ambulatory Visit (INDEPENDENT_AMBULATORY_CARE_PROVIDER_SITE_OTHER): Payer: Medicare HMO

## 2017-02-18 DIAGNOSIS — Z5181 Encounter for therapeutic drug level monitoring: Secondary | ICD-10-CM | POA: Diagnosis not present

## 2017-02-18 DIAGNOSIS — I4821 Permanent atrial fibrillation: Secondary | ICD-10-CM

## 2017-02-18 DIAGNOSIS — I482 Chronic atrial fibrillation: Secondary | ICD-10-CM

## 2017-02-18 LAB — POCT INR: INR: 2.1

## 2017-02-20 ENCOUNTER — Telehealth: Payer: Self-pay | Admitting: *Deleted

## 2017-02-20 NOTE — Telephone Encounter (Signed)
Patient left a msg on the refill vm requesting a refill on furosemide be sent to [facility] for the new dose that Dr Aundra Dubin prescribed. Patient can be reached at [phone].

## 2017-02-26 DIAGNOSIS — H353131 Nonexudative age-related macular degeneration, bilateral, early dry stage: Secondary | ICD-10-CM | POA: Diagnosis not present

## 2017-02-26 DIAGNOSIS — Z961 Presence of intraocular lens: Secondary | ICD-10-CM | POA: Diagnosis not present

## 2017-02-26 DIAGNOSIS — H04123 Dry eye syndrome of bilateral lacrimal glands: Secondary | ICD-10-CM | POA: Diagnosis not present

## 2017-03-02 ENCOUNTER — Ambulatory Visit (HOSPITAL_COMMUNITY)
Admission: RE | Admit: 2017-03-02 | Discharge: 2017-03-02 | Disposition: A | Discharge status: Home / Self Care | Payer: Medicare HMO | Source: Ambulatory Visit | Attending: Cardiology | Admitting: Cardiology

## 2017-03-02 DIAGNOSIS — I272 Pulmonary hypertension, unspecified: Secondary | ICD-10-CM | POA: Diagnosis not present

## 2017-03-02 LAB — BASIC METABOLIC PANEL
Anion gap: 7 (ref 5–15)
BUN: 29 mg/dL — ABNORMAL HIGH (ref 6–20)
CALCIUM: 9 mg/dL (ref 8.9–10.3)
CHLORIDE: 105 mmol/L (ref 101–111)
CO2: 29 mmol/L (ref 22–32)
CREATININE: 1.13 mg/dL — AB (ref 0.44–1.00)
GFR calc non Af Amer: 44 mL/min — ABNORMAL LOW (ref >60)
GFR, EST AFRICAN AMERICAN: 51 mL/min — AB (ref >60)
GLUCOSE: 97 mg/dL (ref 65–99)
Potassium: 4.3 mmol/L (ref 3.5–5.1)
Sodium: 141 mmol/L (ref 135–145)

## 2017-03-03 MED ORDER — FUROSEMIDE 40 MG PO TABS: ORAL_TABLET | ORAL | 3 refills | Status: DC

## 2017-03-11 ENCOUNTER — Ambulatory Visit (INDEPENDENT_AMBULATORY_CARE_PROVIDER_SITE_OTHER): Payer: Medicare HMO | Admitting: *Deleted

## 2017-03-11 DIAGNOSIS — Z5181 Encounter for therapeutic drug level monitoring: Secondary | ICD-10-CM

## 2017-03-11 DIAGNOSIS — I4821 Permanent atrial fibrillation: Secondary | ICD-10-CM

## 2017-03-11 DIAGNOSIS — I482 Chronic atrial fibrillation: Secondary | ICD-10-CM

## 2017-03-11 LAB — POCT INR: INR: 2.6

## 2017-04-10 ENCOUNTER — Encounter (INDEPENDENT_AMBULATORY_CARE_PROVIDER_SITE_OTHER): Payer: Self-pay

## 2017-04-10 ENCOUNTER — Ambulatory Visit (INDEPENDENT_AMBULATORY_CARE_PROVIDER_SITE_OTHER): Payer: Medicare HMO | Admitting: *Deleted

## 2017-04-10 DIAGNOSIS — Z5181 Encounter for therapeutic drug level monitoring: Secondary | ICD-10-CM

## 2017-04-10 DIAGNOSIS — I482 Chronic atrial fibrillation: Secondary | ICD-10-CM

## 2017-04-10 DIAGNOSIS — I4821 Permanent atrial fibrillation: Secondary | ICD-10-CM

## 2017-04-10 LAB — POCT INR: INR: 3.2

## 2017-04-15 DIAGNOSIS — E7800 Pure hypercholesterolemia, unspecified: Secondary | ICD-10-CM | POA: Diagnosis not present

## 2017-04-15 DIAGNOSIS — Z9181 History of falling: Secondary | ICD-10-CM | POA: Diagnosis not present

## 2017-04-15 DIAGNOSIS — E559 Vitamin D deficiency, unspecified: Secondary | ICD-10-CM | POA: Diagnosis not present

## 2017-04-15 DIAGNOSIS — I10 Essential (primary) hypertension: Secondary | ICD-10-CM | POA: Diagnosis not present

## 2017-04-15 DIAGNOSIS — E039 Hypothyroidism, unspecified: Secondary | ICD-10-CM | POA: Diagnosis not present

## 2017-04-15 DIAGNOSIS — I481 Persistent atrial fibrillation: Secondary | ICD-10-CM | POA: Diagnosis not present

## 2017-04-16 DIAGNOSIS — I481 Persistent atrial fibrillation: Secondary | ICD-10-CM | POA: Diagnosis not present

## 2017-04-16 DIAGNOSIS — J449 Chronic obstructive pulmonary disease, unspecified: Secondary | ICD-10-CM | POA: Diagnosis not present

## 2017-04-16 DIAGNOSIS — N183 Chronic kidney disease, stage 3 (moderate): Secondary | ICD-10-CM | POA: Diagnosis not present

## 2017-04-16 DIAGNOSIS — R531 Weakness: Secondary | ICD-10-CM | POA: Diagnosis not present

## 2017-04-16 DIAGNOSIS — E039 Hypothyroidism, unspecified: Secondary | ICD-10-CM | POA: Diagnosis not present

## 2017-04-16 DIAGNOSIS — I129 Hypertensive chronic kidney disease with stage 1 through stage 4 chronic kidney disease, or unspecified chronic kidney disease: Secondary | ICD-10-CM | POA: Diagnosis not present

## 2017-04-17 ENCOUNTER — Telehealth (HOSPITAL_COMMUNITY): Payer: Self-pay | Admitting: *Deleted

## 2017-04-17 NOTE — Telephone Encounter (Signed)
PT therapist from [facility] called asking for verbal orders to continue therapy and to also add home health RN for regular assessment checks.  Verbal orders given.

## 2017-04-20 DIAGNOSIS — E039 Hypothyroidism, unspecified: Secondary | ICD-10-CM | POA: Diagnosis not present

## 2017-04-20 DIAGNOSIS — I481 Persistent atrial fibrillation: Secondary | ICD-10-CM | POA: Diagnosis not present

## 2017-04-20 DIAGNOSIS — J449 Chronic obstructive pulmonary disease, unspecified: Secondary | ICD-10-CM | POA: Diagnosis not present

## 2017-04-20 DIAGNOSIS — R531 Weakness: Secondary | ICD-10-CM | POA: Diagnosis not present

## 2017-04-20 DIAGNOSIS — I129 Hypertensive chronic kidney disease with stage 1 through stage 4 chronic kidney disease, or unspecified chronic kidney disease: Secondary | ICD-10-CM | POA: Diagnosis not present

## 2017-04-20 DIAGNOSIS — N183 Chronic kidney disease, stage 3 (moderate): Secondary | ICD-10-CM | POA: Diagnosis not present

## 2017-04-21 DIAGNOSIS — N183 Chronic kidney disease, stage 3 (moderate): Secondary | ICD-10-CM | POA: Diagnosis not present

## 2017-04-21 DIAGNOSIS — I481 Persistent atrial fibrillation: Secondary | ICD-10-CM | POA: Diagnosis not present

## 2017-04-21 DIAGNOSIS — R531 Weakness: Secondary | ICD-10-CM | POA: Diagnosis not present

## 2017-04-21 DIAGNOSIS — J449 Chronic obstructive pulmonary disease, unspecified: Secondary | ICD-10-CM | POA: Diagnosis not present

## 2017-04-21 DIAGNOSIS — I129 Hypertensive chronic kidney disease with stage 1 through stage 4 chronic kidney disease, or unspecified chronic kidney disease: Secondary | ICD-10-CM | POA: Diagnosis not present

## 2017-04-21 DIAGNOSIS — E039 Hypothyroidism, unspecified: Secondary | ICD-10-CM | POA: Diagnosis not present

## 2017-04-24 ENCOUNTER — Ambulatory Visit (INDEPENDENT_AMBULATORY_CARE_PROVIDER_SITE_OTHER): Payer: Medicare HMO | Admitting: *Deleted

## 2017-04-24 ENCOUNTER — Encounter (INDEPENDENT_AMBULATORY_CARE_PROVIDER_SITE_OTHER): Payer: Self-pay

## 2017-04-24 DIAGNOSIS — Z5181 Encounter for therapeutic drug level monitoring: Secondary | ICD-10-CM | POA: Diagnosis not present

## 2017-04-24 DIAGNOSIS — I481 Persistent atrial fibrillation: Secondary | ICD-10-CM | POA: Diagnosis not present

## 2017-04-24 DIAGNOSIS — E039 Hypothyroidism, unspecified: Secondary | ICD-10-CM | POA: Diagnosis not present

## 2017-04-24 DIAGNOSIS — I482 Chronic atrial fibrillation: Secondary | ICD-10-CM

## 2017-04-24 DIAGNOSIS — I4821 Permanent atrial fibrillation: Secondary | ICD-10-CM

## 2017-04-24 DIAGNOSIS — R531 Weakness: Secondary | ICD-10-CM | POA: Diagnosis not present

## 2017-04-24 DIAGNOSIS — N183 Chronic kidney disease, stage 3 (moderate): Secondary | ICD-10-CM | POA: Diagnosis not present

## 2017-04-24 DIAGNOSIS — I129 Hypertensive chronic kidney disease with stage 1 through stage 4 chronic kidney disease, or unspecified chronic kidney disease: Secondary | ICD-10-CM | POA: Diagnosis not present

## 2017-04-24 DIAGNOSIS — J449 Chronic obstructive pulmonary disease, unspecified: Secondary | ICD-10-CM | POA: Diagnosis not present

## 2017-04-24 LAB — POCT INR: INR: 2.4

## 2017-04-28 DIAGNOSIS — E039 Hypothyroidism, unspecified: Secondary | ICD-10-CM | POA: Diagnosis not present

## 2017-04-28 DIAGNOSIS — R531 Weakness: Secondary | ICD-10-CM | POA: Diagnosis not present

## 2017-04-28 DIAGNOSIS — I129 Hypertensive chronic kidney disease with stage 1 through stage 4 chronic kidney disease, or unspecified chronic kidney disease: Secondary | ICD-10-CM | POA: Diagnosis not present

## 2017-04-28 DIAGNOSIS — I481 Persistent atrial fibrillation: Secondary | ICD-10-CM | POA: Diagnosis not present

## 2017-04-28 DIAGNOSIS — N183 Chronic kidney disease, stage 3 (moderate): Secondary | ICD-10-CM | POA: Diagnosis not present

## 2017-04-28 DIAGNOSIS — J449 Chronic obstructive pulmonary disease, unspecified: Secondary | ICD-10-CM | POA: Diagnosis not present

## 2017-05-01 DIAGNOSIS — J449 Chronic obstructive pulmonary disease, unspecified: Secondary | ICD-10-CM | POA: Diagnosis not present

## 2017-05-01 DIAGNOSIS — R531 Weakness: Secondary | ICD-10-CM | POA: Diagnosis not present

## 2017-05-01 DIAGNOSIS — I129 Hypertensive chronic kidney disease with stage 1 through stage 4 chronic kidney disease, or unspecified chronic kidney disease: Secondary | ICD-10-CM | POA: Diagnosis not present

## 2017-05-01 DIAGNOSIS — I481 Persistent atrial fibrillation: Secondary | ICD-10-CM | POA: Diagnosis not present

## 2017-05-01 DIAGNOSIS — N183 Chronic kidney disease, stage 3 (moderate): Secondary | ICD-10-CM | POA: Diagnosis not present

## 2017-05-01 DIAGNOSIS — E039 Hypothyroidism, unspecified: Secondary | ICD-10-CM | POA: Diagnosis not present

## 2017-05-05 DIAGNOSIS — I129 Hypertensive chronic kidney disease with stage 1 through stage 4 chronic kidney disease, or unspecified chronic kidney disease: Secondary | ICD-10-CM | POA: Diagnosis not present

## 2017-05-05 DIAGNOSIS — J449 Chronic obstructive pulmonary disease, unspecified: Secondary | ICD-10-CM | POA: Diagnosis not present

## 2017-05-05 DIAGNOSIS — I481 Persistent atrial fibrillation: Secondary | ICD-10-CM | POA: Diagnosis not present

## 2017-05-05 DIAGNOSIS — R531 Weakness: Secondary | ICD-10-CM | POA: Diagnosis not present

## 2017-05-05 DIAGNOSIS — N183 Chronic kidney disease, stage 3 (moderate): Secondary | ICD-10-CM | POA: Diagnosis not present

## 2017-05-05 DIAGNOSIS — E039 Hypothyroidism, unspecified: Secondary | ICD-10-CM | POA: Diagnosis not present

## 2017-05-08 DIAGNOSIS — I129 Hypertensive chronic kidney disease with stage 1 through stage 4 chronic kidney disease, or unspecified chronic kidney disease: Secondary | ICD-10-CM | POA: Diagnosis not present

## 2017-05-08 DIAGNOSIS — N183 Chronic kidney disease, stage 3 (moderate): Secondary | ICD-10-CM | POA: Diagnosis not present

## 2017-05-08 DIAGNOSIS — J449 Chronic obstructive pulmonary disease, unspecified: Secondary | ICD-10-CM | POA: Diagnosis not present

## 2017-05-08 DIAGNOSIS — I481 Persistent atrial fibrillation: Secondary | ICD-10-CM | POA: Diagnosis not present

## 2017-05-08 DIAGNOSIS — R531 Weakness: Secondary | ICD-10-CM | POA: Diagnosis not present

## 2017-05-08 DIAGNOSIS — E039 Hypothyroidism, unspecified: Secondary | ICD-10-CM | POA: Diagnosis not present

## 2017-05-12 DIAGNOSIS — E039 Hypothyroidism, unspecified: Secondary | ICD-10-CM | POA: Diagnosis not present

## 2017-05-12 DIAGNOSIS — J449 Chronic obstructive pulmonary disease, unspecified: Secondary | ICD-10-CM | POA: Diagnosis not present

## 2017-05-12 DIAGNOSIS — N183 Chronic kidney disease, stage 3 (moderate): Secondary | ICD-10-CM | POA: Diagnosis not present

## 2017-05-12 DIAGNOSIS — I481 Persistent atrial fibrillation: Secondary | ICD-10-CM | POA: Diagnosis not present

## 2017-05-12 DIAGNOSIS — I129 Hypertensive chronic kidney disease with stage 1 through stage 4 chronic kidney disease, or unspecified chronic kidney disease: Secondary | ICD-10-CM | POA: Diagnosis not present

## 2017-05-12 DIAGNOSIS — R531 Weakness: Secondary | ICD-10-CM | POA: Diagnosis not present

## 2017-05-15 DIAGNOSIS — E039 Hypothyroidism, unspecified: Secondary | ICD-10-CM | POA: Diagnosis not present

## 2017-05-15 DIAGNOSIS — N183 Chronic kidney disease, stage 3 (moderate): Secondary | ICD-10-CM | POA: Diagnosis not present

## 2017-05-15 DIAGNOSIS — I129 Hypertensive chronic kidney disease with stage 1 through stage 4 chronic kidney disease, or unspecified chronic kidney disease: Secondary | ICD-10-CM | POA: Diagnosis not present

## 2017-05-15 DIAGNOSIS — R531 Weakness: Secondary | ICD-10-CM | POA: Diagnosis not present

## 2017-05-15 DIAGNOSIS — J449 Chronic obstructive pulmonary disease, unspecified: Secondary | ICD-10-CM | POA: Diagnosis not present

## 2017-05-15 DIAGNOSIS — I481 Persistent atrial fibrillation: Secondary | ICD-10-CM | POA: Diagnosis not present

## 2017-05-19 ENCOUNTER — Ambulatory Visit (INDEPENDENT_AMBULATORY_CARE_PROVIDER_SITE_OTHER): Payer: Medicare HMO

## 2017-05-19 ENCOUNTER — Ambulatory Visit (HOSPITAL_COMMUNITY)
Admission: RE | Admit: 2017-05-19 | Discharge: 2017-05-19 | Disposition: A | Discharge status: Home / Self Care | Payer: Medicare HMO | Source: Ambulatory Visit | Attending: Cardiology | Admitting: Cardiology

## 2017-05-19 ENCOUNTER — Encounter (HOSPITAL_COMMUNITY): Payer: Self-pay | Admitting: Cardiology

## 2017-05-19 VITALS — BP 107/61 | HR 73 | Wt 213.5 lb

## 2017-05-19 DIAGNOSIS — Z9049 Acquired absence of other specified parts of digestive tract: Secondary | ICD-10-CM | POA: Diagnosis not present

## 2017-05-19 DIAGNOSIS — G4733 Obstructive sleep apnea (adult) (pediatric): Secondary | ICD-10-CM | POA: Insufficient documentation

## 2017-05-19 DIAGNOSIS — I482 Chronic atrial fibrillation: Secondary | ICD-10-CM | POA: Diagnosis not present

## 2017-05-19 DIAGNOSIS — I4821 Permanent atrial fibrillation: Secondary | ICD-10-CM

## 2017-05-19 DIAGNOSIS — E785 Hyperlipidemia, unspecified: Secondary | ICD-10-CM | POA: Insufficient documentation

## 2017-05-19 DIAGNOSIS — N183 Chronic kidney disease, stage 3 (moderate): Secondary | ICD-10-CM | POA: Insufficient documentation

## 2017-05-19 DIAGNOSIS — I272 Pulmonary hypertension, unspecified: Secondary | ICD-10-CM | POA: Diagnosis not present

## 2017-05-19 DIAGNOSIS — E039 Hypothyroidism, unspecified: Secondary | ICD-10-CM | POA: Insufficient documentation

## 2017-05-19 DIAGNOSIS — Z5181 Encounter for therapeutic drug level monitoring: Secondary | ICD-10-CM | POA: Diagnosis not present

## 2017-05-19 DIAGNOSIS — I130 Hypertensive heart and chronic kidney disease with heart failure and stage 1 through stage 4 chronic kidney disease, or unspecified chronic kidney disease: Secondary | ICD-10-CM | POA: Insufficient documentation

## 2017-05-19 DIAGNOSIS — I350 Nonrheumatic aortic (valve) stenosis: Secondary | ICD-10-CM | POA: Insufficient documentation

## 2017-05-19 DIAGNOSIS — I5032 Chronic diastolic (congestive) heart failure: Secondary | ICD-10-CM | POA: Diagnosis not present

## 2017-05-19 DIAGNOSIS — Z7901 Long term (current) use of anticoagulants: Secondary | ICD-10-CM | POA: Diagnosis not present

## 2017-05-19 LAB — BASIC METABOLIC PANEL
Anion gap: 6 (ref 5–15)
BUN: 27 mg/dL — ABNORMAL HIGH (ref 6–20)
CO2: 30 mmol/L (ref 22–32)
Calcium: 9.1 mg/dL (ref 8.9–10.3)
Chloride: 104 mmol/L (ref 101–111)
Creatinine, Ser: 1.23 mg/dL — ABNORMAL HIGH (ref 0.44–1.00)
GFR calc non Af Amer: 39 mL/min — ABNORMAL LOW (ref >60)
GFR, EST AFRICAN AMERICAN: 46 mL/min — AB (ref >60)
Glucose, Bld: 99 mg/dL (ref 65–99)
POTASSIUM: 4.2 mmol/L (ref 3.5–5.1)
SODIUM: 140 mmol/L (ref 135–145)

## 2017-05-19 LAB — POCT INR: INR: 2

## 2017-05-19 MED ORDER — FUROSEMIDE 20 MG PO TABS: 20.0000 mg | ORAL_TABLET | ORAL | 3 refills | Status: DC

## 2017-05-19 MED ORDER — FUROSEMIDE 40 MG PO TABS: ORAL_TABLET | ORAL | 3 refills | Status: DC

## 2017-05-19 NOTE — Patient Instructions (Signed)
Prescription for TED hose  Labs drawn today  Your physician recommends that you schedule a follow-up appointment in: 3 months

## 2017-05-19 NOTE — Progress Notes (Signed)
PCP: Dr. Darcus Austin Primary Cardiology: Dr. Radford Pax HF Cardiology: Dr. Aundra Dubin  83 yo with history of permanent atrial fibrillation and diastolic CHF presents for CHF clinic followup.  She has been followed by Dr. Radford Pax.  She has had atrial fibrillation since 2002, cardioversion was unsuccessful at that time.  She has done reasonably well over the years, but more recently developed progressive exercise intolerance.  She is limited by knee arthritis and low back pain, but clearly became more short of breath over the last 6 months. She is not very active.  When I saw her initially, she was short of breath walking up any stairs. She was short of breath dressing.  She was short of breath walking longer distances around her house.  No orthopnea/PND.  No chest pain.    RHC showed elevated right and left heart filling pressures and pulmonary venous hypertension.  Weight dropped 20 lbs and Dr. Aundra Dubin backed off on her Lasix to 40 qam/20 qpm with rise in BUN/creatinine.   Returns today for HF follow up. Overall, feeling well. Denies SOB with working with PT. She has her weights charted, weight continues to fall, has remained stable at 211-213 pounds. Drinking less than 2L a day. Eating low sodium diet. She denies dizziness, lightheadedness, syncope, presyncope.    Labs (1/18): creatinine 1.06 Labs (4/18): K 3.9, creatinine 1.04 Labs (5/18): K 4.2, creatinine 1.18  PMH: 1. Atrial fibrillation: Permanent. Present since 2002.  2. H/o cholecystectomy  3. Hypothyroidism 4. HTN 5. Hyperlipidemia 6. OSA: Cannot tolerate CPAP.  7. CKD stage III 8. Chronic diastolic CHF: Echo (1/49) with EF 50-55%, moderate to severe LVH, moderate AS (AVA 1.0 cm^2, mean gradient 21 mmHg), mild AI, mild RV dilation with moderately decreased RV systolic function, moderate to severe TR, PASP 69 mmHg, moderate mitral regurgitation.  - RHC (4/18): mean RA 14, PA 61/24 mean 41, mean PCWP 28, CI 3.48, PVR 1.8 WU.  9.  Aortic stenosis: Moderate on last echo in 2/18.   Social History   Social History  . Marital status: Married    Spouse name: N/A  . Number of children: 3  . Years of education: HS   Occupational History  . Not on file.   Social History Main Topics  . Smoking status: Never Smoker  . Smokeless tobacco: Never Used  . Alcohol use No  . Drug use: No  . Sexual activity: Not on file   Other Topics Concern  . Not on file   Social History Narrative   Lives at home with two daughters and her friend.   Left-handed.   No caffeine use.   Family History  Problem Relation Age of Onset  . Heart disease Mother   . Heart disease Father   . Heart disease Sister   . Diabetes Sister    ROS: All systems reviewed and negative except as per HPI.  Current Outpatient Prescriptions  Medication Sig Dispense Refill  . acetaminophen (TYLENOL) 500 MG tablet Take 1,000-2,000 mg by mouth 2 (two) times daily as needed for moderate pain or headache.     . alendronate (FOSAMAX) 70 MG tablet Take 70 mg by mouth every Wednesday.   11  . Calcium Carb-Cholecalciferol (CALCIUM + D3) 600-200 MG-UNIT TABS Take 1 tablet by mouth in the morning and 2 tablets at night    . dextromethorphan (DELSYM) 30 MG/5ML liquid Take 30 mg by mouth as needed for cough.    . furosemide (LASIX)  40 MG tablet Take 40 mg (1 Tablet) Twice Daily, alternating with 40 mg (1 Tablet) in the AM and 20 mg (0.5 Tablet) in the PM. 90 tablet 3  . gabapentin (NEURONTIN) 100 MG capsule Take 1 capsule (100 mg total) by mouth 3 (three) times daily. 270 capsule 4  . glucosamine-chondroitin 500-400 MG tablet Take 2 tablets in the morning and 1 tablet at night    . levothyroxine (SYNTHROID, LEVOTHROID) 137 MCG tablet Take 137 mcg by mouth daily before breakfast.    . loratadine (CLARITIN) 10 MG tablet Take 10 mg by mouth daily as needed for allergies.    Marland Kitchen lovastatin (MEVACOR) 40 MG tablet Take 40 mg by mouth at bedtime.    . metoprolol succinate  (TOPROL-XL) 25 MG 24 hr tablet Take 25 mg by mouth daily.    . Multiple Vitamin (MULTIVITAMIN) tablet Take 1 tablet by mouth daily.    . Multiple Vitamins-Minerals (EYE VITAMINS PO) Take 1 capsule by mouth 2 (two) times daily.     . Polyvinyl Alcohol-Povidone (REFRESH OP) Apply 1 drop to eye daily.    . ramipril (ALTACE) 10 MG capsule Take 1 capsule (10 mg total) by mouth daily. 90 capsule 3  . warfarin (COUMADIN) 5 MG tablet TAKE 1 TABLET DAILY OR AS DIRECTED BY COUMADIN CLINIC  90 tablet 1   No current facility-administered medications for this encounter.    BP 107/61   Pulse 73   Wt 213 lb 8 oz (96.8 kg)   SpO2 94%   BMI 36.94 kg/m  General: Elderly appearing. No resp difficulty. HEENT: Normal Neck: Supple. JVP 5-6. Carotids 2+ bilat; no bruits. No thyromegaly or nodule noted. Cor: PMI nondisplaced. RRR, 3/6 AS murmur. Lungs: CTAB, normal effort. Abdomen: Soft, non-tender, non-distended, no HSM. No bruits or masses. +BS  Extremities: No cyanosis, clubbing, rash. Trace pedal edema.  Neuro: Alert & orientedx3, cranial nerves grossly intact. moves all 4 extremities w/o difficulty. Affect pleasant   Assessment/Plan: 1. Atrial fibrillation: Permanent.   - Rate controlled on Toprol XL 25 mg daily - Continue warfarin for anticoagulation. Denies melena and hematochezia.   2. Chronic diastolic CHF: With prominent RV failure noted on most recent echo. RHC showed pulmonary venous hypertension with elevated left and right heart filling pressures.   - NYHA II-III - She has improved with diuresis. Continue lasix 40 mg BID alternating with 40qam/20qpm.  - Check BMET today.   3. Aortic stenosis: Moderate - Mean gradient is 21 mm Hg, AVA 1.0cm^2  4. Pulmonary hypertension: She has group 2 PH due to LV diastolic dysfunction/elevated LA pressure (pulmonary venous hypertension).  - Improved with diuresis.   Place TED hose, BMET today. Follow up in 3 months with Dr. Melene Muller 05/19/2017

## 2017-05-20 ENCOUNTER — Telehealth: Payer: Self-pay | Admitting: *Deleted

## 2017-05-20 DIAGNOSIS — J449 Chronic obstructive pulmonary disease, unspecified: Secondary | ICD-10-CM | POA: Diagnosis not present

## 2017-05-20 DIAGNOSIS — R531 Weakness: Secondary | ICD-10-CM | POA: Diagnosis not present

## 2017-05-20 DIAGNOSIS — I129 Hypertensive chronic kidney disease with stage 1 through stage 4 chronic kidney disease, or unspecified chronic kidney disease: Secondary | ICD-10-CM | POA: Diagnosis not present

## 2017-05-20 DIAGNOSIS — E039 Hypothyroidism, unspecified: Secondary | ICD-10-CM | POA: Diagnosis not present

## 2017-05-20 DIAGNOSIS — I481 Persistent atrial fibrillation: Secondary | ICD-10-CM | POA: Diagnosis not present

## 2017-05-20 DIAGNOSIS — N183 Chronic kidney disease, stage 3 (moderate): Secondary | ICD-10-CM | POA: Diagnosis not present

## 2017-05-20 MED ORDER — RAMIPRIL 5 MG PO CAPS: 5.0000 mg | ORAL_CAPSULE | Freq: Every day | ORAL | 1 refills | Status: DC

## 2017-05-20 NOTE — Telephone Encounter (Signed)
Pt dropped off list of BP readings 04/16/17-05/19/17, Dr Radford Pax reviewed. Per Dr Kelby Fam altace to 5 mg daily due to low BP, HTN Clinic 1 week.  I discussed with Sandra Merritt (DPR)--she verbalized understanding-appt in HTN Clinic scheduled for 06/04/17, Sandra Merritt  will continue to monitor BP and bring log of readings to appointment in HTN Clinic.

## 2017-05-25 DIAGNOSIS — I129 Hypertensive chronic kidney disease with stage 1 through stage 4 chronic kidney disease, or unspecified chronic kidney disease: Secondary | ICD-10-CM | POA: Diagnosis not present

## 2017-05-25 DIAGNOSIS — I481 Persistent atrial fibrillation: Secondary | ICD-10-CM | POA: Diagnosis not present

## 2017-05-25 DIAGNOSIS — J449 Chronic obstructive pulmonary disease, unspecified: Secondary | ICD-10-CM | POA: Diagnosis not present

## 2017-05-25 DIAGNOSIS — R531 Weakness: Secondary | ICD-10-CM | POA: Diagnosis not present

## 2017-05-25 DIAGNOSIS — N183 Chronic kidney disease, stage 3 (moderate): Secondary | ICD-10-CM | POA: Diagnosis not present

## 2017-05-25 DIAGNOSIS — E039 Hypothyroidism, unspecified: Secondary | ICD-10-CM | POA: Diagnosis not present

## 2017-05-26 DIAGNOSIS — Z23 Encounter for immunization: Secondary | ICD-10-CM | POA: Diagnosis not present

## 2017-06-03 NOTE — Progress Notes (Signed)
Patient ID: CYNAI SKEENS                 DOB: [birth year]                      MRN: [id number]     HPI: Sandra Merritt is a 83 y.o. female referred by Dr. Radford Pax to HTN clinic. PMH is significant for permanent afib, moderate MR, mild AS, pulmonary HTN, HTN, HLD, CKD. On 9/19, pt was advised to decrease ramipril to 5mg  daily due to hypotension. She presents today for follow up with [name], [place] and caregiver.  Pt reports feeling well overall. Her dizziness has improved since decreasing her ramipril dose. Pt brings in detailed log of home BP readings - they have improved from 90-100/40-60 to 105-120/50-60 since decreasing her dose of ramipril. She has been ambulating better since starting physical therapy. She does ambulate with a walker. She has been weighing herself at home daily with weights ranging 208-212 lbs. She brings in her home BP cuff which measures accurately. Clinic reading still low at 98/54, home cuff reading 102/47. She took her medication ~1 hour ago. She limits fluid intake to 2L daily, follows a low salt diet, and does not drink caffeine.  Current HTN meds: Toprol 25mg  daily, ramipril 5mg  daily, Lasix 40mg  AM and 20mg /40mg  alternating in the PM BP goal: <140/44mmHg due to age  Family History: The patient's family history includes Diabetes in her sister; Heart disease in her father, mother, and sister.   Social History: Denies tobacco, alcohol, and illicit drug use.  Diet: 1 cup of decaf coffee each day, no caffeine intake. Limits salt intake.   Exercise: Walks assisted with a walker.   Wt Readings from Last 3 Encounters:  05/19/17 213 lb 8 oz (96.8 kg)  02/16/17 213 lb 4 oz (96.7 kg)  12/30/16 213 lb (96.6 kg)   BP Readings from Last 3 Encounters:  05/19/17 107/61  02/16/17 (!) 118/54  12/30/16 113/63   Pulse Readings from Last 3 Encounters:  05/19/17 73  02/16/17 (!) 56  12/30/16 71    Renal function: CrCl cannot be calculated (Unknown ideal weight.).  Past Medical  History:  Diagnosis Date  . Aortic stenosis    mild by echo 2017  . CKD (chronic kidney disease), stage III   . Colon polyps   . COPD (chronic obstructive pulmonary disease) ([place])   . Dyslipidemia   . Hypertension   . Hypothyroid   . LFTs abnormal    History of abnormal LFTs due to Fatty Liver  . Macular degeneration   . Memory loss   . Mitral regurgitation    mdoerate by echo 07/2015  . Obesity   . OSA (obstructive sleep apnea)    intolerant to CPAP  . Osteoarthritis   . Osteopenia   . Permanent atrial fibrillation ([facility])    Chronic on coumadin  . Pulmonary HTN ([place])    moderate with PASP 80mmHg on echo 07/2015  . Right sided facial pain   . Vitamin D deficiency     Current Outpatient Prescriptions on File Prior to Visit  Medication Sig Dispense Refill  . acetaminophen (TYLENOL) 500 MG tablet Take 1,000-2,000 mg by mouth 2 (two) times daily as needed for moderate pain or headache.     . alendronate (FOSAMAX) 70 MG tablet Take 70 mg by mouth every Wednesday.   11  . Calcium Carb-Cholecalciferol (CALCIUM + D3) 600-200 MG-UNIT TABS Take 1 tablet by  mouth in the morning and 2 tablets at night    . dextromethorphan (DELSYM) 30 MG/5ML liquid Take 30 mg by mouth as needed for cough.    . furosemide (LASIX) 20 MG tablet Take 1 tablet (20 mg total) by mouth every other day. In the PM 15 tablet 3  . furosemide (LASIX) 40 MG tablet Take 40 mg (1 Tablet) every am and  40 mg (1 Tablet) every other PM. 45 tablet 3  . gabapentin (NEURONTIN) 100 MG capsule Take 1 capsule (100 mg total) by mouth 3 (three) times daily. 270 capsule 4  . glucosamine-chondroitin 500-400 MG tablet Take 2 tablets in the morning and 1 tablet at night    . levothyroxine (SYNTHROID, LEVOTHROID) 137 MCG tablet Take 137 mcg by mouth daily before breakfast.    . loratadine (CLARITIN) 10 MG tablet Take 10 mg by mouth daily as needed for allergies.    Marland Kitchen lovastatin (MEVACOR) 40 MG tablet Take 40 mg by mouth at bedtime.      . metoprolol succinate (TOPROL-XL) 25 MG 24 hr tablet Take 25 mg by mouth daily.    . Multiple Vitamin (MULTIVITAMIN) tablet Take 1 tablet by mouth daily.    . Multiple Vitamins-Minerals (EYE VITAMINS PO) Take 1 capsule by mouth 2 (two) times daily.     . Polyvinyl Alcohol-Povidone (REFRESH OP) Apply 1 drop to eye daily.    . ramipril (ALTACE) 5 MG capsule Take 1 capsule (5 mg total) by mouth daily. 90 capsule 1  . warfarin (COUMADIN) 5 MG tablet TAKE 1 TABLET DAILY OR AS DIRECTED BY COUMADIN CLINIC  90 tablet 1   No current facility-administered medications on file prior to visit.     Allergies  Allergen Reactions  . Milk-Related Compounds     cough  . Penicillins Hives and Rash    Has patient had a PCN reaction causing immediate rash, facial/tongue/throat swelling, SOB or lightheadedness with hypotension: Yes Has patient had a PCN reaction causing severe rash involving mucus membranes or skin necrosis: Yes Has patient had a PCN reaction that required hospitalization No Has patient had a PCN reaction occurring within the last 10 years: No If all of the above answers are "NO", then may proceed with Cephalosporin use.      Assessment/Plan:  1. Hypertension - Home BP readings have improved with ramipril dose decrease to 5mg  daily and pt reports less dizziness. However, clinic reading and home cuff reading in clinic both remain low ~100/50s. Will further decrease ramipril to 2.5mg  daily and continue other medications. Ideally would prefer to keep ACEi on board rather than discontinuing due to benefit in CKD. Pt advised to continue to monitor her BP at home. Will f/u for BP check in 1 month at same time as next INR check. If BP is improved and stable, will switch rx back to mail order per pt preference.   Megan E. Supple, PharmD, CPP, [facility] [address] [place], [place] [address] Phone: [phone]; Fax: [phone] 06/04/2017 9:58 AM

## 2017-06-04 ENCOUNTER — Ambulatory Visit (INDEPENDENT_AMBULATORY_CARE_PROVIDER_SITE_OTHER): Payer: Medicare HMO | Admitting: Pharmacist

## 2017-06-04 VITALS — BP 98/54 | HR 65

## 2017-06-04 DIAGNOSIS — I10 Essential (primary) hypertension: Secondary | ICD-10-CM

## 2017-06-04 MED ORDER — RAMIPRIL 2.5 MG PO CAPS: 2.5000 mg | ORAL_CAPSULE | Freq: Every day | ORAL | 1 refills | Status: DC

## 2017-06-04 NOTE — Patient Instructions (Addendum)
Decrease your ramipril to 2.5mg  once daily - prescription has been sent to your [facility] pharmacy  Continue taking your other blood pressure medications  Continue to check your blood pressure at home and record your readings  Follow up in clinic in 1 month for blood pressure check (same day as Coumadin appointment)

## 2017-06-15 ENCOUNTER — Other Ambulatory Visit: Payer: Self-pay | Admitting: Cardiology

## 2017-06-26 ENCOUNTER — Other Ambulatory Visit: Payer: Self-pay | Admitting: Cardiology

## 2017-06-29 ENCOUNTER — Other Ambulatory Visit: Payer: Self-pay | Admitting: Cardiology

## 2017-06-29 NOTE — Telephone Encounter (Signed)
This is a CHF pt. Patient has an appt with the CHF clinic in December. Please address

## 2017-06-30 ENCOUNTER — Ambulatory Visit (INDEPENDENT_AMBULATORY_CARE_PROVIDER_SITE_OTHER): Payer: Medicare HMO | Admitting: Pharmacist

## 2017-06-30 VITALS — BP 112/58 | HR 60 | Ht 65.0 in | Wt 212.0 lb

## 2017-06-30 DIAGNOSIS — I4821 Permanent atrial fibrillation: Secondary | ICD-10-CM

## 2017-06-30 DIAGNOSIS — I482 Chronic atrial fibrillation: Secondary | ICD-10-CM | POA: Diagnosis not present

## 2017-06-30 DIAGNOSIS — Z5181 Encounter for therapeutic drug level monitoring: Secondary | ICD-10-CM | POA: Diagnosis not present

## 2017-06-30 DIAGNOSIS — I10 Essential (primary) hypertension: Secondary | ICD-10-CM | POA: Diagnosis not present

## 2017-06-30 LAB — POCT INR: INR: 1.4

## 2017-06-30 MED ORDER — RAMIPRIL 2.5 MG PO CAPS: 2.5000 mg | ORAL_CAPSULE | Freq: Every day | ORAL | 1 refills | Status: DC

## 2017-06-30 NOTE — Progress Notes (Signed)
Patient ID: AYZIA DAY                 DOB: Nov 06, 1931                      MRN: [id number]     HPI: Sandra Merritt is a 83 y.o. female referred by Dr. Radford Pax to HTN clinic. PMH is significant for permanent afib, moderate MR, mild AS, pulmonary HTN, HTN, HLD, CKD. On 05/20/17, pt was advised to decrease ramipril to 5mg  daily due to hypotension. At last HTN clinic f/u her BP was found to be improved to 105-120s/50-60s at home, however persistently low per office readings. Ramipril was further decreased to 2.5 mg daily. Today she presents for follow up with Sandra Merritt, [place] and caregiver.  Patient reports 1 fall out of bed while she was sleeping (rolled over while sleeping), however this was not due to any dizziness.  Had a cut on her face and some bruising, has healed since then. Saw scant amount of bright red blood in toilet bowl after BM x2 this week so skipped coumadin dose on 06/27/17 and ate an extra serving of greens.   Otherwise, denies dizziness, syncope, HA, visual changes, changes in gait or speech. Completed physical therapy but patient is still continuing exercises. Feeling more confident ambulating with a cane.  Home BP readings improved to 100s-120s/50s-60s; HR 60s-80s  Last took meds this morning around 7:30 AM  Current HTN meds: Metoprolol succinate 25 mg daily, ramipril 2.5 mg daily, furosemide 40 mg AM and 20 mg/40 mg alternating in the PM BP goal: <140/90 mmHg due to age  Family History: The patient's family history includes Diabetes in her sister; Heart disease in her father, mother, and sister.  Social History: Denies tobacco, alcohol, and illicit drug use.  Diet: Continues to watch fluid intake, following low salt diet, 1 decaf cup of coffee/day   Exercise: Walks assisted with a walker or cane  Wt Readings from Last 3 Encounters:  06/30/17 212 lb (96.2 kg)  05/19/17 213 lb 8 oz (96.8 kg)  02/16/17 213 lb 4 oz (96.7 kg)   BP Readings from Last 3 Encounters:  06/30/17  (!) 112/58  06/04/17 (!) 98/54  05/19/17 107/61   Pulse Readings from Last 3 Encounters:  06/30/17 60  06/04/17 65  05/19/17 73    Renal function: CrCl cannot be calculated (Patient's most recent lab result is older than the maximum 21 days allowed.).  Past Medical History:  Diagnosis Date  . Aortic stenosis    mild by echo 2017  . CKD (chronic kidney disease), stage III ([place])   . Colon polyps   . COPD (chronic obstructive pulmonary disease) ([place])   . Dyslipidemia   . Hypertension   . Hypothyroid   . LFTs abnormal    History of abnormal LFTs due to Fatty Liver  . Macular degeneration   . Memory loss   . Mitral regurgitation    mdoerate by echo 07/2015  . Obesity   . OSA (obstructive sleep apnea)    intolerant to CPAP  . Osteoarthritis   . Osteopenia   . Permanent atrial fibrillation (HCC)    Chronic on coumadin  . Pulmonary HTN ([place])    moderate with PASP 44mmHg on echo 07/2015  . Right sided facial pain   . Vitamin D deficiency     Current Outpatient Prescriptions on File Prior to Visit  Medication Sig Dispense Refill  . acetaminophen (TYLENOL)  500 MG tablet Take 1,000-2,000 mg by mouth 2 (two) times daily as needed for moderate pain or headache.     . alendronate (FOSAMAX) 70 MG tablet Take 70 mg by mouth every Wednesday.   11  . Calcium Carb-Cholecalciferol (CALCIUM + D3) 600-200 MG-UNIT TABS Take 1 tablet by mouth in the morning and 2 tablets at night    . dextromethorphan (DELSYM) 30 MG/5ML liquid Take 30 mg by mouth as needed for cough.    . furosemide (LASIX) 20 MG tablet Take 1 tablet (20 mg total) by mouth every other day. In the PM 15 tablet 3  . furosemide (LASIX) 40 MG tablet Take 40 mg (1 Tablet) every am and  40 mg (1 Tablet) every other PM. 45 tablet 3  . gabapentin (NEURONTIN) 100 MG capsule Take 1 capsule (100 mg total) by mouth 3 (three) times daily. 270 capsule 4  . glucosamine-chondroitin 500-400 MG tablet Take 2 tablets in the morning and 1  tablet at night    . levothyroxine (SYNTHROID, LEVOTHROID) 137 MCG tablet Take 137 mcg by mouth daily before breakfast.    . loratadine (CLARITIN) 10 MG tablet Take 10 mg by mouth daily as needed for allergies.    Marland Kitchen lovastatin (MEVACOR) 40 MG tablet Take 40 mg by mouth at bedtime.    . metoprolol succinate (TOPROL-XL) 25 MG 24 hr tablet Take 25 mg by mouth daily.    . Multiple Vitamin (MULTIVITAMIN) tablet Take 1 tablet by mouth daily.    . Multiple Vitamins-Minerals (EYE VITAMINS PO) Take 1 capsule by mouth 2 (two) times daily.     . Polyvinyl Alcohol-Povidone (REFRESH OP) Apply 1 drop to eye daily.    Marland Kitchen warfarin (COUMADIN) 5 MG tablet TAKE 1 TABLET DAILY OR AS DIRECTED BY COUMADIN CLINIC  90 tablet 1   No current facility-administered medications on file prior to visit.     Allergies  Allergen Reactions  . Milk-Related Compounds     cough  . Penicillins Hives and Rash    Has patient had a PCN reaction causing immediate rash, facial/tongue/throat swelling, SOB or lightheadedness with hypotension: Yes Has patient had a PCN reaction causing severe rash involving mucus membranes or skin necrosis: Yes Has patient had a PCN reaction that required hospitalization No Has patient had a PCN reaction occurring within the last 10 years: No If all of the above answers are "NO", then may proceed with Cephalosporin use.      Assessment/Plan:  1. Hypertension - Blood pressure is below goal of <140/90 mmHg (due to age) and has improved per home readings and in-office check. No symptoms of hypotension at home. Will continue metoprolol succinate 25 mg daily, ramipril 2.5 mg daily, furosemide 40 mg AM and 20 mg/40 mg alternating in the PM. Prescription for ramipril 2.5 mg daily sent to [facility] mail order pharmacy per patient request. Patient is to continue checking BP at home and bring in readings at coumadin visit in 3 weeks for review.   Sandra Merritt, Pharm.D. PGY2 [facility]  Resident Phone: [phone]

## 2017-06-30 NOTE — Patient Instructions (Signed)
Good to see you today!   1. No changes to your medications.   2. Keep taking your blood pressure daily and writing this down.   3. Bring your blood pressure log in for your next coumadin visit and we will take a look at it.

## 2017-07-03 ENCOUNTER — Telehealth: Payer: Self-pay | Admitting: *Deleted

## 2017-07-03 NOTE — Telephone Encounter (Signed)
Dtr called to inform us that the pt took Friday's dose last night which was a 1 tablet & needed to know what to take today. Advised pt to take 1/2 tablet today (which is the dose she should have taken for Thursday) and continue regular dosing tomorrow. She verbalized understanding & advised her to call back with any other issues.

## 2017-07-21 ENCOUNTER — Ambulatory Visit (INDEPENDENT_AMBULATORY_CARE_PROVIDER_SITE_OTHER): Payer: Medicare HMO | Admitting: *Deleted

## 2017-07-21 DIAGNOSIS — Z5181 Encounter for therapeutic drug level monitoring: Secondary | ICD-10-CM

## 2017-07-21 DIAGNOSIS — I482 Chronic atrial fibrillation: Secondary | ICD-10-CM

## 2017-07-21 DIAGNOSIS — I4821 Permanent atrial fibrillation: Secondary | ICD-10-CM

## 2017-07-21 LAB — POCT INR: INR: 2.4

## 2017-07-21 NOTE — Patient Instructions (Signed)
Continue  1 tablet daily except 1/2 tablet on Tuesdays, Thursdays and Saturdays. Recheck in 4 weeks. Call [phone] if scheduled for any procedure or any change in medications

## 2017-08-18 ENCOUNTER — Ambulatory Visit (HOSPITAL_COMMUNITY)
Admission: RE | Admit: 2017-08-18 | Discharge: 2017-08-18 | Disposition: A | Discharge status: Home / Self Care | Payer: Medicare HMO | Source: Ambulatory Visit | Attending: Cardiology | Admitting: Cardiology

## 2017-08-18 ENCOUNTER — Ambulatory Visit (INDEPENDENT_AMBULATORY_CARE_PROVIDER_SITE_OTHER): Payer: Medicare HMO | Admitting: *Deleted

## 2017-08-18 VITALS — BP 116/74 | HR 74 | Wt 217.5 lb

## 2017-08-18 DIAGNOSIS — I482 Chronic atrial fibrillation: Secondary | ICD-10-CM | POA: Insufficient documentation

## 2017-08-18 DIAGNOSIS — E785 Hyperlipidemia, unspecified: Secondary | ICD-10-CM | POA: Diagnosis not present

## 2017-08-18 DIAGNOSIS — E039 Hypothyroidism, unspecified: Secondary | ICD-10-CM | POA: Diagnosis not present

## 2017-08-18 DIAGNOSIS — Z9049 Acquired absence of other specified parts of digestive tract: Secondary | ICD-10-CM | POA: Insufficient documentation

## 2017-08-18 DIAGNOSIS — Z7901 Long term (current) use of anticoagulants: Secondary | ICD-10-CM | POA: Insufficient documentation

## 2017-08-18 DIAGNOSIS — G4733 Obstructive sleep apnea (adult) (pediatric): Secondary | ICD-10-CM | POA: Insufficient documentation

## 2017-08-18 DIAGNOSIS — I4821 Permanent atrial fibrillation: Secondary | ICD-10-CM

## 2017-08-18 DIAGNOSIS — Z79899 Other long term (current) drug therapy: Secondary | ICD-10-CM | POA: Insufficient documentation

## 2017-08-18 DIAGNOSIS — I350 Nonrheumatic aortic (valve) stenosis: Secondary | ICD-10-CM | POA: Diagnosis not present

## 2017-08-18 DIAGNOSIS — N183 Chronic kidney disease, stage 3 (moderate): Secondary | ICD-10-CM | POA: Diagnosis not present

## 2017-08-18 DIAGNOSIS — Z5181 Encounter for therapeutic drug level monitoring: Secondary | ICD-10-CM | POA: Diagnosis not present

## 2017-08-18 DIAGNOSIS — Z79890 Hormone replacement therapy (postmenopausal): Secondary | ICD-10-CM | POA: Diagnosis not present

## 2017-08-18 DIAGNOSIS — I5032 Chronic diastolic (congestive) heart failure: Secondary | ICD-10-CM | POA: Diagnosis not present

## 2017-08-18 DIAGNOSIS — I130 Hypertensive heart and chronic kidney disease with heart failure and stage 1 through stage 4 chronic kidney disease, or unspecified chronic kidney disease: Secondary | ICD-10-CM | POA: Insufficient documentation

## 2017-08-18 DIAGNOSIS — I272 Pulmonary hypertension, unspecified: Secondary | ICD-10-CM | POA: Diagnosis not present

## 2017-08-18 LAB — BASIC METABOLIC PANEL
Anion gap: 5 (ref 5–15)
BUN: 22 mg/dL — AB (ref 6–20)
CHLORIDE: 102 mmol/L (ref 101–111)
CO2: 31 mmol/L (ref 22–32)
CREATININE: 1.08 mg/dL — AB (ref 0.44–1.00)
Calcium: 9.3 mg/dL (ref 8.9–10.3)
GFR calc Af Amer: 53 mL/min — ABNORMAL LOW (ref >60)
GFR calc non Af Amer: 46 mL/min — ABNORMAL LOW (ref >60)
Glucose, Bld: 95 mg/dL (ref 65–99)
POTASSIUM: 4.1 mmol/L (ref 3.5–5.1)
Sodium: 138 mmol/L (ref 135–145)

## 2017-08-18 LAB — POCT INR: INR: 2.3

## 2017-08-18 MED ORDER — FUROSEMIDE 20 MG PO TABS: 20.0000 mg | ORAL_TABLET | ORAL | 3 refills | Status: DC

## 2017-08-18 MED ORDER — FUROSEMIDE 40 MG PO TABS: 40.0000 mg | ORAL_TABLET | Freq: Two times a day (BID) | ORAL | 3 refills | Status: DC

## 2017-08-18 NOTE — Patient Instructions (Signed)
Description   Continue  1 tablet daily except 1/2 tablet on Tuesdays, Thursdays and Saturdays. Recheck in 5 weeks. Call [phone] if scheduled for any procedure or any change in medications

## 2017-08-18 NOTE — Patient Instructions (Signed)
Increase Furosemide 60 mg (1.5 tab) in AM, then 40 mg (1 tab) for 2 days   -Then go back 40 mg (1 tab) daily  Labs drawn today (if we do not call you, then your lab work was stable)   Your physician recommends that you schedule a follow-up appointment in: 2 weeks   Your physician has requested that you have an echocardiogram. Echocardiography is a painless test that uses sound waves to create images of your heart. It provides your doctor with information about the size and shape of your heart and how well your heart's chambers and valves are working. This procedure takes approximately one hour. There are no restrictions for this procedure.  Your physician recommends that you schedule a follow-up appointment in: February, 2019 with Dr. Aundra Dubin an a echocardiogram

## 2017-08-18 NOTE — Progress Notes (Signed)
PCP: Dr. Darcus Austin Primary Cardiology: Dr. Radford Pax HF Cardiology: Dr. Aundra Dubin  83 y.o. with history of permanent atrial fibrillation and diastolic CHF presents for CHF clinic followup.  She has been followed by Dr. Radford Pax.  She has had atrial fibrillation since 2002, cardioversion was unsuccessful at that time.  She has done reasonably well over the years, but more recently developed progressive exercise intolerance.  She is limited by knee arthritis and low back pain, but clearly became more short of breath over the last 6 months. She is not very active.  When I saw her initially, she was short of breath walking up any stairs. She was short of breath dressing.  She was short of breath walking longer distances around her house.  No orthopnea/PND.  No chest pain.    I increased her Lasix. RHC showed elevated right and left heart filling pressures and pulmonary venous hypertension.  Weight dropped 20 lbs and I backed off on her Lasix to 40 qam/20 qpm alternating with 40 mg bid with rise in BUN/creatinine.    She returns for followup of CHF. Weight is up 4 lbs. She walks with a cane and continues to report no dyspnea walking on flat ground, though she is not very active. No orthopnea/PND.  No chest pain. No BRBPR/melena.   Labs (1/18): creatinine 1.06 Labs (4/18): K 3.9, creatinine 1.04 Labs (5/18): K 4.2, creatinine 1.18 Labs (9/18): K 4.2, creatinine 1.23  PMH: 1. Atrial fibrillation: Permanent. Present since 2002.  2. H/o cholecystectomy  3. Hypothyroidism 4. HTN 5. Hyperlipidemia 6. OSA: Cannot tolerate CPAP.  7. CKD stage III 8. Chronic diastolic CHF: Echo (7/03) with EF 50-55%, moderate to severe LVH, moderate AS (AVA 1.0 cm^2, mean gradient 21 mmHg), mild AI, mild RV dilation with moderately decreased RV systolic function, moderate to severe TR, PASP 69 mmHg, moderate mitral regurgitation.  - RHC (4/18): mean RA 14, PA 61/24 mean 41, mean PCWP 28, CI 3.48, PVR 1.8 WU.  9. Aortic  stenosis: Moderate on last echo in 2/18.   Social History   Socioeconomic History  . Marital status: Married    Spouse name: Not on file  . Number of children: 3  . Years of education: HS  . Highest education level: Not on file  Social Needs  . Financial resource strain: Not on file  . Food insecurity - worry: Not on file  . Food insecurity - inability: Not on file  . Transportation needs - medical: Not on file  . Transportation needs - non-medical: Not on file  Occupational History  . Not on file  Tobacco Use  . Smoking status: Never Smoker  . Smokeless tobacco: Never Used  Substance and Sexual Activity  . Alcohol use: No  . Drug use: No  . Sexual activity: Not on file  Other Topics Concern  . Not on file  Social History Narrative   Lives at home with two daughters and her friend.   Left-handed.   No caffeine use.   Family History  Problem Relation Age of Onset  . Heart disease Mother   . Heart disease Father   . Heart disease Sister   . Diabetes Sister    ROS: All systems reviewed and negative except as per HPI.  Current Outpatient Medications  Medication Sig Dispense Refill  . acetaminophen (TYLENOL) 500 MG tablet Take 1,000-2,000 mg by mouth 2 (two) times daily as needed for moderate pain or headache.     . alendronate (FOSAMAX) 70  MG tablet Take 70 mg by mouth every Wednesday.   11  . Calcium Carb-Cholecalciferol (CALCIUM + D3) 600-200 MG-UNIT TABS Take 1 tablet by mouth in the morning and 2 tablets at night    . dextromethorphan (DELSYM) 30 MG/5ML liquid Take 30 mg by mouth as needed for cough.    . furosemide (LASIX) 40 MG tablet Take 40 mg (1 Tablet) every am and  40 mg (1 Tablet) every other PM. 45 tablet 3  . furosemide (LASIX) 40 MG tablet Take 1 tablet (40 mg total) by mouth 2 (two) times daily. 60 tablet 3  . glucosamine-chondroitin 500-400 MG tablet Take 2 tablets in the morning and 1 tablet at night    . levothyroxine (SYNTHROID, LEVOTHROID) 137 MCG  tablet Take 137 mcg by mouth daily before breakfast.    . loratadine (CLARITIN) 10 MG tablet Take 10 mg by mouth daily as needed for allergies.    Marland Kitchen lovastatin (MEVACOR) 40 MG tablet Take 40 mg by mouth at bedtime.    . metoprolol succinate (TOPROL-XL) 25 MG 24 hr tablet Take 25 mg by mouth daily.    . Multiple Vitamin (MULTIVITAMIN) tablet Take 1 tablet by mouth daily.    . Multiple Vitamins-Minerals (EYE VITAMINS PO) Take 1 capsule by mouth 2 (two) times daily.     . Polyvinyl Alcohol-Povidone (REFRESH OP) Apply 1 drop to eye daily.    . ramipril (ALTACE) 2.5 MG capsule Take 1 capsule (2.5 mg total) by mouth daily. 90 capsule 1  . warfarin (COUMADIN) 5 MG tablet TAKE 1 TABLET DAILY OR AS DIRECTED BY COUMADIN CLINIC 90 tablet 1   No current facility-administered medications for this encounter.    BP 116/74   Pulse 74   Wt 217 lb 8 oz (98.7 kg)   SpO2 95%   BMI 36.19 kg/m  General: NAD Neck: JVP 8 cm with HJR, no thyromegaly or thyroid nodule.  Lungs: Clear to auscultation bilaterally with normal respiratory effort. CV: Nondisplaced PMI.  Heart irregular S1/S2, no S3/S4, 3/6 SEM RUSB with obscured S2. 1+ ankle edema.  No carotid bruit.  Normal pedal pulses.  Abdomen: Soft, nontender, no hepatosplenomegaly, no distention.  Skin: Intact without lesions or rashes.  Neurologic: Alert and oriented x 3.  Psych: Normal affect. Extremities: No clubbing or cyanosis.  HEENT: Normal.   Assessment/Plan: 1. Atrial fibrillation: Permanent.  Adequate rate control on current Toprol XL.  She is on warfarin with no problems.  2. Chronic diastolic CHF: With prominent RV failure noted on most recent echo. RHC showed pulmonary venous hypertension with elevated left and right heart filling pressures.  NYHA class II.  Weight up today with volume overload on exam.    - Increase Lasix to 60 qam/40 qpm x 2 days then 40 mg bid after that.  BMET today and again in 10 days.  - Need to continue to follow low  sodium diet.   3. Aortic stenosis: Moderate AS on last echo.  Prominent echo with muffled S2.  - Repeat echo at 1 year (2/19).   4. Pulmonary hypertension: She has group 2 PH due to LV diastolic dysfunction/elevated LA pressure (pulmonary venous hypertension).   Followup in 2/19 with echo.   Loralie Champagne 08/18/2017

## 2017-09-02 ENCOUNTER — Ambulatory Visit (HOSPITAL_COMMUNITY)
Admission: RE | Admit: 2017-09-02 | Discharge: 2017-09-02 | Disposition: A | Discharge status: Home / Self Care | Payer: Medicare HMO | Source: Ambulatory Visit | Attending: Cardiology | Admitting: Cardiology

## 2017-09-02 DIAGNOSIS — I350 Nonrheumatic aortic (valve) stenosis: Secondary | ICD-10-CM | POA: Diagnosis not present

## 2017-09-02 LAB — BASIC METABOLIC PANEL
ANION GAP: 6 (ref 5–15)
BUN: 25 mg/dL — ABNORMAL HIGH (ref 6–20)
CALCIUM: 9.2 mg/dL (ref 8.9–10.3)
CO2: 32 mmol/L (ref 22–32)
Chloride: 100 mmol/L — ABNORMAL LOW (ref 101–111)
Creatinine, Ser: 1.3 mg/dL — ABNORMAL HIGH (ref 0.44–1.00)
GFR calc non Af Amer: 37 mL/min — ABNORMAL LOW (ref >60)
GFR, EST AFRICAN AMERICAN: 43 mL/min — AB (ref >60)
Glucose, Bld: 93 mg/dL (ref 65–99)
Potassium: 4.4 mmol/L (ref 3.5–5.1)
Sodium: 138 mmol/L (ref 135–145)

## 2017-09-22 NOTE — Progress Notes (Signed)
Cardiology Office Note:    Date:  09/23/2017   ID:  Sandra Merritt, DOB [birth year], MRN [id number]  PCP:  Sandra Austin, MD  Cardiologist:  No primary care provider on file.    Referring MD: Sandra Austin, MD   Chief Complaint  Patient presents with  . Atrial Fibrillation  . Aortic Stenosis  . Hypertension    History of Present Illness:    Sandra Merritt is a 84 y.o. female with a hx of  permanent atrial fibrillation on systemic anticoagulation, moderate MR, mild AS, moderate pulmonary HTN and HTN .  She was evaluated by Dr. Aundra Dubin in AHF clinic for pulmonary HTN and DOE.  RHC showed elevated right and Left heart filling pressures c/w pulmonary venous HTN and diuretics were adjusted.  Her las echo showed normal LVF with severe LVH, moderate AS, midl MR with moderate MR, and right heart failure with moderate to severe pulmonary HTN.  Dr. Aundra Dubin saw her in December and her weight was up and her diuretics were adjusted.   She is here today for followup and is doing well.  She denies any chest pain or pressure, SOB, DOE, PND, orthopnea, dizziness, palpitations or syncope. She is compliant with her meds and is tolerating meds with no SE.  Her weight is up 3lbs today from her last OV.  At home it has been fairly constant at 212-215lbs but today is up to 220lbs.  She does eat out some at [facility].  She has chronic LE edema which she thinks has irmproved with diuretics and compression hose.    Past Medical History:  Diagnosis Date  . Aortic stenosis    mild by echo 2017  . CKD (chronic kidney disease), stage III ([place])   . Colon polyps   . COPD (chronic obstructive pulmonary disease) ([place])   . Dyslipidemia   . Hypertension   . Hypothyroid   . LFTs abnormal    History of abnormal LFTs due to Fatty Liver  . Macular degeneration   . Memory loss   . Mitral regurgitation    mdoerate by echo 07/2015  . Obesity   . OSA (obstructive sleep apnea)    intolerant to CPAP  . Osteoarthritis   .  Osteopenia   . Permanent atrial fibrillation (HCC)    Chronic on coumadin  . Pulmonary HTN ([place])    moderate with PASP 15mmHg on echo 07/2015  . Right sided facial pain   . Vitamin D deficiency     Past Surgical History:  Procedure Laterality Date  . BREAST BIOPSY    . CHOLECYSTECTOMY    . KNEE SURGERY Right   . RIGHT HEART CATH N/A 12/15/2016   Procedure: Right Heart Cath;  Surgeon: Larey Dresser, MD;  Location: [facility];  Service: Cardiovascular;  Laterality: N/A;    Current Medications: Current Meds  Medication Sig  . acetaminophen (TYLENOL) 500 MG tablet Take 1,000-2,000 mg by mouth 2 (two) times daily as needed for moderate pain or headache.   . alendronate (FOSAMAX) 70 MG tablet Take 70 mg by mouth every Wednesday.   . Calcium Carb-Cholecalciferol (CALCIUM + D3) 600-200 MG-UNIT TABS Take 1 tablet by mouth in the morning and 2 tablets at night  . dextromethorphan (DELSYM) 30 MG/5ML liquid Take 30 mg by mouth as needed for cough.  . furosemide (LASIX) 40 MG tablet Take 40 mg (1 Tablet) every am and  40 mg (1 Tablet) every other PM.  . furosemide (  LASIX) 40 MG tablet Take 40 mg by mouth as directed.  Marland Kitchen glucosamine-chondroitin 500-400 MG tablet Take 2 tablets in the morning and 1 tablet at night  . levothyroxine (SYNTHROID, LEVOTHROID) 137 MCG tablet Take 137 mcg by mouth daily before breakfast.  . loratadine (CLARITIN) 10 MG tablet Take 10 mg by mouth daily as needed for allergies.  Marland Kitchen lovastatin (MEVACOR) 40 MG tablet Take 40 mg by mouth at bedtime.  . metoprolol succinate (TOPROL-XL) 25 MG 24 hr tablet Take 25 mg by mouth daily.  . Multiple Vitamin (MULTIVITAMIN) tablet Take 1 tablet by mouth daily.  . Multiple Vitamins-Minerals (EYE VITAMINS PO) Take 1 capsule by mouth 2 (two) times daily.   . Polyvinyl Alcohol-Povidone (REFRESH OP) Apply 1 drop to eye daily.  . ramipril (ALTACE) 5 MG capsule Take 5 mg by mouth daily.  Marland Kitchen warfarin (COUMADIN) 5 MG tablet TAKE 1  TABLET DAILY OR AS DIRECTED BY COUMADIN CLINIC     Allergies:   Milk-related compounds and Penicillins   Social History   Socioeconomic History  . Marital status: Married    Spouse name: None  . Number of children: 3  . Years of education: HS  . Highest education level: None  Social Needs  . Financial resource strain: None  . Food insecurity - worry: None  . Food insecurity - inability: None  . Transportation needs - medical: None  . Transportation needs - non-medical: None  Occupational History  . None  Tobacco Use  . Smoking status: Never Smoker  . Smokeless tobacco: Never Used  Substance and Sexual Activity  . Alcohol use: No  . Drug use: No  . Sexual activity: None  Other Topics Concern  . None  Social History Narrative   Lives at home with two daughters and her friend.   Left-handed.   No caffeine use.     Family History: The patient's family history includes Diabetes in her sister; Heart disease in her father, mother, and sister.  ROS:   Please see the history of present illness.    ROS  All other systems reviewed and negative.   EKGs/Labs/Other Studies Reviewed:    The following studies were reviewed today: AHF notes  EKG:  EKG is ordered today showing atrial fibrillation with CVR and low voltage Recent Labs: 12/08/2016: Hemoglobin 11.7; Platelets 120 12/30/2016: B Natriuretic Peptide 281.6 09/02/2017: BUN 25; Creatinine, Ser 1.30; Potassium 4.4; Sodium 138   Recent Lipid Panel No results found for: CHOL, TRIG, HDL, CHOLHDL, VLDL, LDLCALC, LDLDIRECT  Physical Exam:    VS:  BP 112/62   Pulse 100   Ht 5\' 4"  (1.626 m)   Wt 220 lb 8 oz (100 kg)   SpO2 92%   BMI 37.85 kg/m     Wt Readings from Last 3 Encounters:  09/23/17 220 lb 8 oz (100 kg)  08/18/17 217 lb 8 oz (98.7 kg)  06/30/17 212 lb (96.2 kg)     GEN:  Well nourished, well developed in no acute distress HEENT: Normal NECK: No JVD; No carotid bruits LYMPHATICS: No  lymphadenopathy CARDIAC: irregularly irregular, norubs, gallops.  2/6 late peaking SM at RUSB with faint S2 RESPIRATORY:  Clear to auscultation without rales, wheezing or rhonchi  ABDOMEN: Soft, non-tender, non-distended MUSCULOSKELETAL:  No edema; No deformity  SKIN: Warm and dry NEUROLOGIC:  Alert and oriented x 3 PSYCHIATRIC:  Normal affect   ASSESSMENT:    1. Permanent atrial fibrillation ([place])   2. Benign essential HTN  3. Chronic diastolic heart failure ([place])   4. Pulmonary HTN ([place])   5. Nonrheumatic aortic valve stenosis   6. Non-rheumatic mitral regurgitation    PLAN:    In order of problems listed above:  1.  Permanent atrial fibrillation- her heart rate is well controlled.  She will continue on current dose ofToprol.  Continue warfarin.  She denies any problems with bleeding.  2.  HTN - BP is well controlled on exam today. She will continue on Toprol XL 25mg  daily and ramipril 2.5mg  daily.   3.  Chronic diastolic CHF - she appears euvolemic on exam today but her weight is up 3lbs  from OV with Dr. Aundra Dubin in December.  Her LE edema is controlled with her compression hose and diuretics.  She thinks the ankle swelling has improved. She will continue on Lasix 40mg  qam/40mg  qoPM and 40mg  qam/20mg  qopm.  Since her weight has remained fairly stable except this am (weight at home the same as in office today), I have instructed her to take an extra 20mg  of lasix if she gains 3lbs in a day.  If weight does not go back down below 217lbs she is to call the office.  I have also instructed her to watch her sodium more closely and avoid eating out.   4. Moderate to severe pulmonary HTN by echo a year ago with PASP 2mmHg.  [place] 11/2016 with pulmonary venous HTN from diastolic dysfunction/increase LAP.  Stressed importance of following 2gm Na diet. Adding SS Lasix schedule as above.  5.  Moderate AS - this was noted on echo a year ago with mean AVG of 68mmHg and peak velocity 311cm/s and AVA  calculated at 0.85cm2. 2D echo to assess for progression of AS is pending next month.  6.  Mild MS and moderate MR by echo 11/2016  - repeating echo.   Medication Adjustments/Labs and Tests Ordered: Current medicines are reviewed at length with the patient today.  Concerns regarding medicines are outlined above.  No orders of the defined types were placed in this encounter.  No orders of the defined types were placed in this encounter.   Signed, Fransico Him, MD  09/23/2017 10:08 AM    [place]

## 2017-09-23 ENCOUNTER — Ambulatory Visit: Payer: Medicare HMO | Admitting: *Deleted

## 2017-09-23 ENCOUNTER — Encounter: Payer: Self-pay | Admitting: Cardiology

## 2017-09-23 ENCOUNTER — Ambulatory Visit (INDEPENDENT_AMBULATORY_CARE_PROVIDER_SITE_OTHER): Payer: Medicare HMO | Admitting: Cardiology

## 2017-09-23 VITALS — BP 112/62 | HR 100 | Ht 64.0 in | Wt 220.5 lb

## 2017-09-23 DIAGNOSIS — I482 Chronic atrial fibrillation: Secondary | ICD-10-CM | POA: Diagnosis not present

## 2017-09-23 DIAGNOSIS — I5032 Chronic diastolic (congestive) heart failure: Secondary | ICD-10-CM | POA: Diagnosis not present

## 2017-09-23 DIAGNOSIS — I340 Nonrheumatic mitral (valve) insufficiency: Secondary | ICD-10-CM

## 2017-09-23 DIAGNOSIS — I350 Nonrheumatic aortic (valve) stenosis: Secondary | ICD-10-CM | POA: Diagnosis not present

## 2017-09-23 DIAGNOSIS — I4821 Permanent atrial fibrillation: Secondary | ICD-10-CM

## 2017-09-23 DIAGNOSIS — I272 Pulmonary hypertension, unspecified: Secondary | ICD-10-CM | POA: Diagnosis not present

## 2017-09-23 DIAGNOSIS — Z5181 Encounter for therapeutic drug level monitoring: Secondary | ICD-10-CM

## 2017-09-23 DIAGNOSIS — I10 Essential (primary) hypertension: Secondary | ICD-10-CM

## 2017-09-23 LAB — POCT INR: INR: 1.8

## 2017-09-23 MED ORDER — RAMIPRIL 2.5 MG PO CAPS: 2.5000 mg | ORAL_CAPSULE | Freq: Every day | ORAL | 3 refills | Status: DC

## 2017-09-23 MED ORDER — FUROSEMIDE 40 MG PO TABS: 40.0000 mg | ORAL_TABLET | ORAL | 6 refills | Status: DC

## 2017-09-23 NOTE — Patient Instructions (Signed)
Description   Today Jan 23rd take 1 and 1/2 tablets (7.5mg ) then continue  1 tablet daily except 1/2 tablet on Tuesdays, Thursdays and Saturdays. Recheck in 2 weeks. Call [phone] if scheduled for any procedure or any change in medications

## 2017-09-23 NOTE — Patient Instructions (Addendum)
Medication Instructions:  Your physician has recommended you make the following change in your medication:  TAKE: lasix 40 mg once a day. Take additional 20 mg if weight is greater than 3 pounds from baseline weight of 217, and if weight does not decrease to 217 call office.   Labwork: None ordered   Testing/Procedures: None ordered   Follow-Up: Your physician wants you to follow-up in: 6 months with Dr. Radford Pax. You will receive a reminder letter in the mail two months in advance. If you don't receive a letter, please call our office to schedule the follow-up appointment.  Any Other Special Instructions Will Be Listed Below (If Applicable).     If you need a refill on your cardiac medications before your next appointment, please call your pharmacy.

## 2017-10-07 ENCOUNTER — Ambulatory Visit (INDEPENDENT_AMBULATORY_CARE_PROVIDER_SITE_OTHER): Payer: Medicare HMO

## 2017-10-07 DIAGNOSIS — Z5181 Encounter for therapeutic drug level monitoring: Secondary | ICD-10-CM | POA: Diagnosis not present

## 2017-10-07 DIAGNOSIS — I482 Chronic atrial fibrillation: Secondary | ICD-10-CM

## 2017-10-07 DIAGNOSIS — I4821 Permanent atrial fibrillation: Secondary | ICD-10-CM

## 2017-10-07 LAB — POCT INR: INR: 2.4

## 2017-10-07 NOTE — Patient Instructions (Signed)
Description   Continue on same dosage 1 tablet daily except 1/2 tablet on Tuesdays, Thursdays and Saturdays. Recheck in 4 weeks. Call [phone] if scheduled for any procedure or any change in medications    

## 2017-10-20 ENCOUNTER — Ambulatory Visit (HOSPITAL_COMMUNITY)
Admission: RE | Admit: 2017-10-20 | Discharge: 2017-10-20 | Disposition: A | Discharge status: Home / Self Care | Payer: Medicare HMO | Source: Ambulatory Visit | Attending: Family Medicine | Admitting: Family Medicine

## 2017-10-20 ENCOUNTER — Ambulatory Visit (HOSPITAL_BASED_OUTPATIENT_CLINIC_OR_DEPARTMENT_OTHER)
Admission: RE | Admit: 2017-10-20 | Discharge: 2017-10-20 | Disposition: A | Discharge status: Home / Self Care | Payer: Medicare HMO | Source: Ambulatory Visit | Attending: Cardiology | Admitting: Cardiology

## 2017-10-20 ENCOUNTER — Encounter (HOSPITAL_COMMUNITY): Payer: Self-pay | Admitting: Cardiology

## 2017-10-20 VITALS — BP 139/50 | HR 62 | Wt 218.0 lb

## 2017-10-20 DIAGNOSIS — I5032 Chronic diastolic (congestive) heart failure: Secondary | ICD-10-CM | POA: Diagnosis not present

## 2017-10-20 DIAGNOSIS — N183 Chronic kidney disease, stage 3 (moderate): Secondary | ICD-10-CM | POA: Diagnosis not present

## 2017-10-20 DIAGNOSIS — Z7901 Long term (current) use of anticoagulants: Secondary | ICD-10-CM | POA: Diagnosis not present

## 2017-10-20 DIAGNOSIS — I350 Nonrheumatic aortic (valve) stenosis: Secondary | ICD-10-CM | POA: Diagnosis not present

## 2017-10-20 DIAGNOSIS — Z9049 Acquired absence of other specified parts of digestive tract: Secondary | ICD-10-CM | POA: Diagnosis not present

## 2017-10-20 DIAGNOSIS — Z833 Family history of diabetes mellitus: Secondary | ICD-10-CM | POA: Diagnosis not present

## 2017-10-20 DIAGNOSIS — I482 Chronic atrial fibrillation: Secondary | ICD-10-CM | POA: Insufficient documentation

## 2017-10-20 DIAGNOSIS — Z79899 Other long term (current) drug therapy: Secondary | ICD-10-CM | POA: Insufficient documentation

## 2017-10-20 DIAGNOSIS — E039 Hypothyroidism, unspecified: Secondary | ICD-10-CM | POA: Diagnosis not present

## 2017-10-20 DIAGNOSIS — Z8249 Family history of ischemic heart disease and other diseases of the circulatory system: Secondary | ICD-10-CM | POA: Diagnosis not present

## 2017-10-20 DIAGNOSIS — I272 Pulmonary hypertension, unspecified: Secondary | ICD-10-CM | POA: Diagnosis not present

## 2017-10-20 DIAGNOSIS — I340 Nonrheumatic mitral (valve) insufficiency: Secondary | ICD-10-CM | POA: Diagnosis not present

## 2017-10-20 DIAGNOSIS — I130 Hypertensive heart and chronic kidney disease with heart failure and stage 1 through stage 4 chronic kidney disease, or unspecified chronic kidney disease: Secondary | ICD-10-CM | POA: Insufficient documentation

## 2017-10-20 DIAGNOSIS — G4733 Obstructive sleep apnea (adult) (pediatric): Secondary | ICD-10-CM | POA: Insufficient documentation

## 2017-10-20 DIAGNOSIS — E785 Hyperlipidemia, unspecified: Secondary | ICD-10-CM | POA: Diagnosis not present

## 2017-10-20 DIAGNOSIS — I4821 Permanent atrial fibrillation: Secondary | ICD-10-CM

## 2017-10-20 LAB — BASIC METABOLIC PANEL
ANION GAP: 13 (ref 5–15)
BUN: 23 mg/dL — ABNORMAL HIGH (ref 6–20)
CHLORIDE: 101 mmol/L (ref 101–111)
CO2: 26 mmol/L (ref 22–32)
Calcium: 9.3 mg/dL (ref 8.9–10.3)
Creatinine, Ser: 1.15 mg/dL — ABNORMAL HIGH (ref 0.44–1.00)
GFR calc non Af Amer: 43 mL/min — ABNORMAL LOW (ref >60)
GFR, EST AFRICAN AMERICAN: 50 mL/min — AB (ref >60)
Glucose, Bld: 97 mg/dL (ref 65–99)
POTASSIUM: 4.3 mmol/L (ref 3.5–5.1)
Sodium: 140 mmol/L (ref 135–145)

## 2017-10-20 MED ORDER — FUROSEMIDE 40 MG PO TABS: 40.0000 mg | ORAL_TABLET | Freq: Two times a day (BID) | ORAL | 3 refills | Status: DC

## 2017-10-20 NOTE — Patient Instructions (Signed)
Increase Furosemide 60 mg (1.5 tabs) in AM, then 40 mg (1 tab) in PM  -Then take 40 mg (1 tab) in AM, then 40 mg (1 tab) in PM  Labs drawn today (if we do not call you, then your lab work was stable)   Your physician recommends that you return for lab work in: 2 weeks   Your physician recommends that you schedule a follow-up appointment in: 2 months with Dr. Aundra Dubin

## 2017-10-20 NOTE — Progress Notes (Signed)
PCP: Dr. Darcus Austin Primary Cardiology: Dr. Radford Pax HF Cardiology: Dr. Aundra Dubin  84 y.o. with history of permanent atrial fibrillation and diastolic CHF presents for CHF clinic followup.  She has been followed by Dr. Radford Pax.  She has had atrial fibrillation since 2002, cardioversion was unsuccessful at that time.  She has done reasonably well over the years, but more recently developed progressive exercise intolerance.  She is limited by knee arthritis and low back pain, but clearly became more short of breath over the last 6 months. She is not very active.  When I saw her initially, she was short of breath walking up any stairs. She was short of breath dressing.  She was short of breath walking longer distances around her house.  No orthopnea/PND.  No chest pain.    I increased her Lasix. RHC in 4/18 showed elevated right and left heart filling pressures and pulmonary venous hypertension.  Weight dropped 20 lbs and I backed off on her Lasix to 40 qam/20 qpm alternating with 40 mg bid with rise in BUN/creatinine.    Echo was done today and reviewed.  Ef 55-60%, moderate LVH, PASP 65 mmHg, moderate AS with mean gradient 23 mmHg, severe biatrial enlargement.   She returns today for HF follow up. Taking lasix 40/40 alternating with 40/20. Urinating a lot. BLE improved with TED hose and elevation. Working on avoiding salt. Not eating out. Limiting fluid intake to 2 L most days. Weights: 212-215. BP's 100-110s/40-50's. No dizziness. On 2/16, felt like HR was racing. BP was 108/58, HR 60. Walking in hallways and doing exercises without SOB. Seeing an orthopedist for knees on Friday - knees gave out during the night. She did not fall to floor - caught herself. No lighteadedness or dizziness during this episode. No CP. No orthopnea or PND, but sleeps at slight incline.   Labs (1/18): creatinine 1.06 Labs (4/18): K 3.9, creatinine 1.04 Labs (5/18): K 4.2, creatinine 1.18 Labs (9/18): K 4.2, creatinine 1.23 Labs  (1/19): K 4.4, creatinine 1.30  PMH: 1. Atrial fibrillation: Permanent. Present since 2002.  2. H/o cholecystectomy  3. Hypothyroidism 4. HTN 5. Hyperlipidemia 6. OSA: Cannot tolerate CPAP.  7. CKD stage III 8. Chronic diastolic CHF: Echo (1/61) with EF 50-55%, moderate to severe LVH, moderate AS (AVA 1.0 cm^2, mean gradient 21 mmHg), mild AI, mild RV dilation with moderately decreased RV systolic function, moderate to severe TR, PASP 69 mmHg, moderate mitral regurgitation.  - RHC (4/18): mean RA 14, PA 61/24 mean 41, mean PCWP 28, CI 3.48, PVR 1.8 WU.  - Echo (2/19): EF 55-60%, moderate LVH, mildly dilated RV with normal systolic function, PASP 65 mmHg, moderate AS with mean gradient 23 mmHg and AVA 1.2 cm^2, mild to moderate MR, severe biatrial enlargement, dilated IVC.  9. Aortic stenosis: Moderate on last echo in 2/19.   Social History   Socioeconomic History  . Marital status: Married    Spouse name: Not on file  . Number of children: 3  . Years of education: HS  . Highest education level: Not on file  Social Needs  . Financial resource strain: Not on file  . Food insecurity - worry: Not on file  . Food insecurity - inability: Not on file  . Transportation needs - medical: Not on file  . Transportation needs - non-medical: Not on file  Occupational History  . Not on file  Tobacco Use  . Smoking status: Never Smoker  . Smokeless tobacco: Never Used  Substance  and Sexual Activity  . Alcohol use: No  . Drug use: No  . Sexual activity: Not on file  Other Topics Concern  . Not on file  Social History Narrative   Lives at home with two daughters and her friend.   Left-handed.   No caffeine use.   Family History  Problem Relation Age of Onset  . Heart disease Mother   . Heart disease Father   . Heart disease Sister   . Diabetes Sister    ROS: All systems reviewed and negative except as per HPI.  Current Outpatient Medications  Medication Sig Dispense Refill  .  acetaminophen (TYLENOL) 500 MG tablet Take 1,000-2,000 mg by mouth 2 (two) times daily as needed for moderate pain or headache.     . alendronate (FOSAMAX) 70 MG tablet Take 70 mg by mouth every Wednesday.   11  . Calcium Carb-Cholecalciferol (CALCIUM + D3) 600-200 MG-UNIT TABS Take 1 tablet by mouth in the morning and 2 tablets at night    . dextromethorphan (DELSYM) 30 MG/5ML liquid Take 30 mg by mouth as needed for cough.    . furosemide (LASIX) 40 MG tablet Take 40 mg (1 Tablet) every am and  40 mg (1 Tablet) every other PM. (Patient taking differently: Take 40 mg (1 Tablet) twice daily, alternating with 40 mg in the AM and 20 mg in the PM.) 45 tablet 3  . glucosamine-chondroitin 500-400 MG tablet Take 2 tablets in the morning and 1 tablet at night    . levothyroxine (SYNTHROID, LEVOTHROID) 137 MCG tablet Take 137 mcg by mouth daily before breakfast.    . loratadine (CLARITIN) 10 MG tablet Take 10 mg by mouth daily as needed for allergies.    Marland Kitchen lovastatin (MEVACOR) 40 MG tablet Take 40 mg by mouth at bedtime.    . metoprolol succinate (TOPROL-XL) 25 MG 24 hr tablet Take 25 mg by mouth daily.    . Multiple Vitamin (MULTIVITAMIN) tablet Take 1 tablet by mouth daily.    . Multiple Vitamins-Minerals (EYE VITAMINS PO) Take 1 capsule by mouth 2 (two) times daily.     . Polyvinyl Alcohol-Povidone (REFRESH OP) Apply 1 drop to eye daily.    . ramipril (ALTACE) 2.5 MG capsule Take 1 capsule (2.5 mg total) by mouth daily. 90 capsule 3  . warfarin (COUMADIN) 5 MG tablet TAKE 1 TABLET DAILY OR AS DIRECTED BY COUMADIN CLINIC 90 tablet 1   No current facility-administered medications for this encounter.    BP (!) 139/50   Pulse 62   Wt 218 lb (98.9 kg)   SpO2 97%   BMI 37.42 kg/m  General: Elderly, in wheelchair. No resp difficulty. HEENT: Normal Neck: Supple. JVP 8+ cm with HJR. Carotids 2+ bilat; no bruits. No thyromegaly or nodule noted. Cor: PMI nondisplaced. Heart rhythm irregular, 3/6 SEM RUSB  with clear S2. Lungs: CTAB, normal effort. Abdomen: Soft, non-tender, non-distended, no HSM. No bruits or masses. +BS  Extremities: No cyanosis, clubbing, or rash. 1+ bilateral ankle edema.  Neuro: Alert & orientedx3, cranial nerves grossly intact. moves all 4 extremities w/o difficulty. Affect pleasant  Assessment/Plan: 1. Atrial fibrillation: Permanent. She is on warfarin with no problems. Rate controlled with Toprol XL.  2. Chronic diastolic CHF: With prominent RV failure. [place] in 4/18 showed pulmonary venous hypertension with elevated left and right heart filling pressures.  NYHA class II-III, seems more limited by knees than breathing.  Weight up 1 lb today with mild volume overload on  exam    - Take Lasix 60 mg in am and 40 mg in pm tomorrow, then increase lasix to 40 mg bid. - Check BMET today and in 2 weeks 3. Aortic stenosis: Dr. Aundra Dubin reviewed today's echo, moderate AS.  4. Pulmonary hypertension: She has group 2 PH due to LV diastolic dysfunction/elevated LA pressure (pulmonary venous hypertension).  - Continue lasix as above  Follow up in 2 months  Georgiana Shore, NP-C 10/20/2017  Patient seen with NP, agree with the above note.  I made adjustments to the above note to reflect my thoughts.  NYHA class II-III, probably more limited by knees than breathing.    On exam, she is volume overloaded with AS murmur.    I reviewed today's echo: LV EF remains normal with moderate LVH, there is moderate pulmonary hypertension and moderate aortic stenosis.   She will need more diuresis, increase Lasix to 60 qam/40 qpm x 1 day then 40 mg bid after that.  BMET today and again in 2 wks.   Followup in 2 months.   Loralie Champagne 10/20/2017

## 2017-10-20 NOTE — Progress Notes (Signed)
  2D Echocardiogram has been performed.  Shahad Mazurek T Avantae Bither 10/20/2017, 12:19 PM

## 2017-10-21 DIAGNOSIS — D539 Nutritional anemia, unspecified: Secondary | ICD-10-CM | POA: Diagnosis not present

## 2017-10-21 DIAGNOSIS — I10 Essential (primary) hypertension: Secondary | ICD-10-CM | POA: Diagnosis not present

## 2017-10-21 DIAGNOSIS — N183 Chronic kidney disease, stage 3 (moderate): Secondary | ICD-10-CM | POA: Diagnosis not present

## 2017-10-21 DIAGNOSIS — Z79899 Other long term (current) drug therapy: Secondary | ICD-10-CM | POA: Diagnosis not present

## 2017-10-21 DIAGNOSIS — D7589 Other specified diseases of blood and blood-forming organs: Secondary | ICD-10-CM | POA: Diagnosis not present

## 2017-10-21 DIAGNOSIS — I350 Nonrheumatic aortic (valve) stenosis: Secondary | ICD-10-CM | POA: Diagnosis not present

## 2017-10-21 DIAGNOSIS — E7800 Pure hypercholesterolemia, unspecified: Secondary | ICD-10-CM | POA: Diagnosis not present

## 2017-10-21 DIAGNOSIS — E039 Hypothyroidism, unspecified: Secondary | ICD-10-CM | POA: Diagnosis not present

## 2017-10-21 DIAGNOSIS — I481 Persistent atrial fibrillation: Secondary | ICD-10-CM | POA: Diagnosis not present

## 2017-10-21 DIAGNOSIS — I5032 Chronic diastolic (congestive) heart failure: Secondary | ICD-10-CM | POA: Diagnosis not present

## 2017-10-23 DIAGNOSIS — M13861 Other specified arthritis, right knee: Secondary | ICD-10-CM | POA: Diagnosis not present

## 2017-10-23 DIAGNOSIS — M13862 Other specified arthritis, left knee: Secondary | ICD-10-CM | POA: Diagnosis not present

## 2017-10-28 ENCOUNTER — Other Ambulatory Visit: Payer: Self-pay | Admitting: Neurology

## 2017-11-04 ENCOUNTER — Ambulatory Visit (HOSPITAL_COMMUNITY)
Admission: RE | Admit: 2017-11-04 | Discharge: 2017-11-04 | Disposition: A | Discharge status: Home / Self Care | Payer: Medicare HMO | Source: Ambulatory Visit | Attending: Internal Medicine | Admitting: Internal Medicine

## 2017-11-04 ENCOUNTER — Ambulatory Visit (INDEPENDENT_AMBULATORY_CARE_PROVIDER_SITE_OTHER): Payer: Medicare HMO | Admitting: Pharmacist

## 2017-11-04 DIAGNOSIS — I5032 Chronic diastolic (congestive) heart failure: Secondary | ICD-10-CM | POA: Diagnosis not present

## 2017-11-04 DIAGNOSIS — I482 Chronic atrial fibrillation: Secondary | ICD-10-CM | POA: Diagnosis not present

## 2017-11-04 DIAGNOSIS — Z5181 Encounter for therapeutic drug level monitoring: Secondary | ICD-10-CM

## 2017-11-04 DIAGNOSIS — I4821 Permanent atrial fibrillation: Secondary | ICD-10-CM

## 2017-11-04 LAB — BASIC METABOLIC PANEL
Anion gap: 9 (ref 5–15)
BUN: 28 mg/dL — AB (ref 6–20)
CALCIUM: 9.3 mg/dL (ref 8.9–10.3)
CO2: 27 mmol/L (ref 22–32)
Chloride: 102 mmol/L (ref 101–111)
Creatinine, Ser: 1.22 mg/dL — ABNORMAL HIGH (ref 0.44–1.00)
GFR calc Af Amer: 46 mL/min — ABNORMAL LOW (ref >60)
GFR calc non Af Amer: 40 mL/min — ABNORMAL LOW (ref >60)
GLUCOSE: 90 mg/dL (ref 65–99)
Potassium: 4.4 mmol/L (ref 3.5–5.1)
Sodium: 138 mmol/L (ref 135–145)

## 2017-11-04 LAB — POCT INR: INR: 1.8

## 2017-11-04 NOTE — Patient Instructions (Signed)
Description   Take 1.5 tablets today then continue on same dosage 1 tablet daily except 1/2 tablet on Tuesdays, Thursdays and Saturdays. Recheck in 3 weeks. Call [phone] if scheduled for any procedure or any change in medications

## 2017-11-25 ENCOUNTER — Ambulatory Visit (INDEPENDENT_AMBULATORY_CARE_PROVIDER_SITE_OTHER): Payer: Medicare HMO | Admitting: *Deleted

## 2017-11-25 DIAGNOSIS — Z5181 Encounter for therapeutic drug level monitoring: Secondary | ICD-10-CM | POA: Diagnosis not present

## 2017-11-25 DIAGNOSIS — I482 Chronic atrial fibrillation: Secondary | ICD-10-CM

## 2017-11-25 DIAGNOSIS — I4821 Permanent atrial fibrillation: Secondary | ICD-10-CM

## 2017-11-25 LAB — POCT INR: INR: 2.5

## 2017-11-25 NOTE — Patient Instructions (Signed)
Description   Continue on same dosage 1 tablet daily except 1/2 tablet on Tuesdays, Thursdays and Saturdays. Recheck in 4 weeks. Call [phone] if scheduled for any procedure or any change in medications    

## 2017-11-30 ENCOUNTER — Other Ambulatory Visit: Payer: Self-pay | Admitting: Family Medicine

## 2017-11-30 ENCOUNTER — Ambulatory Visit
Admission: RE | Admit: 2017-11-30 | Discharge: 2017-11-30 | Disposition: A | Discharge status: Home / Self Care | Payer: Medicare HMO | Source: Ambulatory Visit | Attending: Family Medicine | Admitting: Family Medicine

## 2017-11-30 DIAGNOSIS — R059 Cough, unspecified: Secondary | ICD-10-CM

## 2017-11-30 DIAGNOSIS — R05 Cough: Secondary | ICD-10-CM

## 2017-11-30 DIAGNOSIS — J209 Acute bronchitis, unspecified: Secondary | ICD-10-CM | POA: Diagnosis not present

## 2017-11-30 DIAGNOSIS — R0602 Shortness of breath: Secondary | ICD-10-CM

## 2017-12-02 ENCOUNTER — Other Ambulatory Visit: Payer: Self-pay | Admitting: Cardiology

## 2017-12-23 ENCOUNTER — Ambulatory Visit (INDEPENDENT_AMBULATORY_CARE_PROVIDER_SITE_OTHER): Payer: Medicare HMO | Admitting: *Deleted

## 2017-12-23 DIAGNOSIS — Z5181 Encounter for therapeutic drug level monitoring: Secondary | ICD-10-CM

## 2017-12-23 DIAGNOSIS — I482 Chronic atrial fibrillation: Secondary | ICD-10-CM

## 2017-12-23 DIAGNOSIS — I4821 Permanent atrial fibrillation: Secondary | ICD-10-CM

## 2017-12-23 LAB — POCT INR: INR: 2.4

## 2017-12-23 NOTE — Patient Instructions (Signed)
Description   Continue on same dosage 1 tablet daily except 1/2 tablet on Tuesdays, Thursdays and Saturdays. Recheck in 4 weeks. Call [phone] if scheduled for any procedure or any change in medications    

## 2017-12-29 ENCOUNTER — Ambulatory Visit (HOSPITAL_COMMUNITY)
Admission: RE | Admit: 2017-12-29 | Discharge: 2017-12-29 | Disposition: A | Discharge status: Home / Self Care | Payer: Medicare HMO | Source: Ambulatory Visit | Attending: Cardiology | Admitting: Cardiology

## 2017-12-29 ENCOUNTER — Other Ambulatory Visit: Payer: Self-pay

## 2017-12-29 ENCOUNTER — Encounter (HOSPITAL_COMMUNITY): Payer: Self-pay | Admitting: Cardiology

## 2017-12-29 VITALS — BP 137/70 | HR 75 | Wt 212.2 lb

## 2017-12-29 DIAGNOSIS — I350 Nonrheumatic aortic (valve) stenosis: Secondary | ICD-10-CM | POA: Insufficient documentation

## 2017-12-29 DIAGNOSIS — I5032 Chronic diastolic (congestive) heart failure: Secondary | ICD-10-CM | POA: Diagnosis not present

## 2017-12-29 DIAGNOSIS — I272 Pulmonary hypertension, unspecified: Secondary | ICD-10-CM | POA: Insufficient documentation

## 2017-12-29 DIAGNOSIS — Z9049 Acquired absence of other specified parts of digestive tract: Secondary | ICD-10-CM | POA: Diagnosis not present

## 2017-12-29 DIAGNOSIS — I482 Chronic atrial fibrillation: Secondary | ICD-10-CM | POA: Diagnosis not present

## 2017-12-29 DIAGNOSIS — E039 Hypothyroidism, unspecified: Secondary | ICD-10-CM | POA: Insufficient documentation

## 2017-12-29 DIAGNOSIS — I130 Hypertensive heart and chronic kidney disease with heart failure and stage 1 through stage 4 chronic kidney disease, or unspecified chronic kidney disease: Secondary | ICD-10-CM | POA: Diagnosis not present

## 2017-12-29 DIAGNOSIS — Z79890 Hormone replacement therapy (postmenopausal): Secondary | ICD-10-CM | POA: Insufficient documentation

## 2017-12-29 DIAGNOSIS — G4733 Obstructive sleep apnea (adult) (pediatric): Secondary | ICD-10-CM | POA: Insufficient documentation

## 2017-12-29 DIAGNOSIS — Z79899 Other long term (current) drug therapy: Secondary | ICD-10-CM | POA: Insufficient documentation

## 2017-12-29 DIAGNOSIS — Z7901 Long term (current) use of anticoagulants: Secondary | ICD-10-CM | POA: Insufficient documentation

## 2017-12-29 DIAGNOSIS — N183 Chronic kidney disease, stage 3 (moderate): Secondary | ICD-10-CM | POA: Insufficient documentation

## 2017-12-29 DIAGNOSIS — I4821 Permanent atrial fibrillation: Secondary | ICD-10-CM

## 2017-12-29 DIAGNOSIS — E785 Hyperlipidemia, unspecified: Secondary | ICD-10-CM | POA: Diagnosis not present

## 2017-12-29 LAB — BASIC METABOLIC PANEL
Anion gap: 6 (ref 5–15)
BUN: 20 mg/dL (ref 6–20)
CHLORIDE: 102 mmol/L (ref 101–111)
CO2: 30 mmol/L (ref 22–32)
CREATININE: 1.2 mg/dL — AB (ref 0.44–1.00)
Calcium: 9.2 mg/dL (ref 8.9–10.3)
GFR calc non Af Amer: 41 mL/min — ABNORMAL LOW (ref >60)
GFR, EST AFRICAN AMERICAN: 47 mL/min — AB (ref >60)
GLUCOSE: 92 mg/dL (ref 65–99)
Potassium: 4.4 mmol/L (ref 3.5–5.1)
Sodium: 138 mmol/L (ref 135–145)

## 2017-12-29 NOTE — Patient Instructions (Signed)
Routine lab work today. Will notify you of abnormal results  Follow up in 3 months with Dr.McLean  

## 2017-12-29 NOTE — Progress Notes (Signed)
PCP: Dr. Darcus Austin Primary Cardiology: Dr. Radford Pax HF Cardiology: Dr. Aundra Dubin  84 y.o. with history of permanent atrial fibrillation and diastolic CHF presents for CHF clinic followup.  She has been followed by Dr. Radford Pax.  She has had atrial fibrillation since 2002, cardioversion was unsuccessful at that time.  She has done reasonably well over the years, but more recently developed progressive exercise intolerance.  She is limited by knee arthritis and low back pain, but clearly became more short of breath over the last 6 months. She is not very active.  When I saw her initially, she was short of breath walking up any stairs. She was short of breath dressing.  She was short of breath walking longer distances around her house.  No orthopnea/PND.  No chest pain.    I increased her Lasix. RHC in 4/18 showed elevated right and left heart filling pressures and pulmonary venous hypertension.  Weight dropped 20 lbs and I backed off on her Lasix to 40 qam/20 qpm alternating with 40 mg bid with rise in BUN/creatinine.    Echo 2/19 EF 55-60%, moderate LVH, PASP 65 mmHg, moderate AS with mean gradient 23 mmHg, severe biatrial enlargement.   She returns today for HF follow up. Last visit, lasix was increased. Overall doing well. She had been walking a lot in her home, but knees were bothering her. She is hoping to get more knee injections. SOB when she walks quickly, but none if she paces herself. No SOB with the few stairs to get inside. No dizziness. Had some midsternal CP last Friday with elevated HR (132) and SBP 130s. No radiation or associated symptoms. Lasted a few minutes then eased off with HR 63, SBP 110s. No orthopnea or PND. Has occasional abdominal bloating. Weights 204-214 lbs, but generally 208-213 lbs recently. Home BPs 100-120s, HR 60-70s.   Labs (1/18): creatinine 1.06 Labs (4/18): K 3.9, creatinine 1.04 Labs (5/18): K 4.2, creatinine 1.18 Labs (9/18): K 4.2, creatinine 1.23 Labs (1/19): K  4.4, creatinine 1.30 Labs (2/19): K 4.3, creatinine 1.15 Labs (3/19): K 4.4, creatinine 1.22  PMH: 1. Atrial fibrillation: Permanent. Present since 2002.  2. H/o cholecystectomy  3. Hypothyroidism 4. HTN 5. Hyperlipidemia 6. OSA: Cannot tolerate CPAP.  7. CKD stage III 8. Chronic diastolic CHF: Echo (9/41) with EF 50-55%, moderate to severe LVH, moderate AS (AVA 1.0 cm^2, mean gradient 21 mmHg), mild AI, mild RV dilation with moderately decreased RV systolic function, moderate to severe TR, PASP 69 mmHg, moderate mitral regurgitation.  - RHC (4/18): mean RA 14, PA 61/24 mean 41, mean PCWP 28, CI 3.48, PVR 1.8 WU.  - Echo (2/19): EF 55-60%, moderate LVH, mildly dilated RV with normal systolic function, PASP 65 mmHg, moderate AS with mean gradient 23 mmHg and AVA 1.2 cm^2, mild to moderate MR, severe biatrial enlargement, dilated IVC.  9. Aortic stenosis: Moderate on last echo in 2/19.   Social History   Socioeconomic History  . Marital status: Married    Spouse name: Not on file  . Number of children: 3  . Years of education: HS  . Highest education level: Not on file  Occupational History  . Not on file  Social Needs  . Financial resource strain: Not on file  . Food insecurity:    Worry: Not on file    Inability: Not on file  . Transportation needs:    Medical: Not on file    Non-medical: Not on file  Tobacco Use  .  Smoking status: Never Smoker  . Smokeless tobacco: Never Used  Substance and Sexual Activity  . Alcohol use: No  . Drug use: No  . Sexual activity: Not on file  Lifestyle  . Physical activity:    Days per week: Not on file    Minutes per session: Not on file  . Stress: Not on file  Relationships  . Social connections:    Talks on phone: Not on file    Gets together: Not on file    Attends religious service: Not on file    Active member of club or organization: Not on file    Attends meetings of clubs or organizations: Not on file    Relationship  status: Not on file  . Intimate partner violence:    Fear of current or ex partner: Not on file    Emotionally abused: Not on file    Physically abused: Not on file    Forced sexual activity: Not on file  Other Topics Concern  . Not on file  Social History Narrative   Lives at home with two daughters and her friend.   Left-handed.   No caffeine use.   Family History  Problem Relation Age of Onset  . Heart disease Mother   . Heart disease Father   . Heart disease Sister   . Diabetes Sister    ROS: All systems reviewed and negative except as per HPI.  Current Outpatient Medications  Medication Sig Dispense Refill  . acetaminophen (TYLENOL) 500 MG tablet Take 1,000-2,000 mg by mouth 2 (two) times daily as needed for moderate pain or headache.     . alendronate (FOSAMAX) 70 MG tablet Take 70 mg by mouth every Wednesday.   11  . Calcium Carb-Cholecalciferol (CALCIUM + D3) 600-200 MG-UNIT TABS Take 1 tablet by mouth in the morning and 2 tablets at night    . dextromethorphan (DELSYM) 30 MG/5ML liquid Take 30 mg by mouth as needed for cough.    . furosemide (LASIX) 40 MG tablet Take 1 tablet (40 mg total) by mouth 2 (two) times daily. 180 tablet 3  . glucosamine-chondroitin 500-400 MG tablet Take 2 tablets in the morning and 1 tablet at night    . levothyroxine (SYNTHROID, LEVOTHROID) 137 MCG tablet Take 137 mcg by mouth daily before breakfast.    . loratadine (CLARITIN) 10 MG tablet Take 10 mg by mouth daily as needed for allergies.    Marland Kitchen lovastatin (MEVACOR) 40 MG tablet Take 40 mg by mouth at bedtime.    . metoprolol succinate (TOPROL-XL) 25 MG 24 hr tablet Take 25 mg by mouth daily.    . Multiple Vitamin (MULTIVITAMIN) tablet Take 1 tablet by mouth daily.    . Multiple Vitamins-Minerals (EYE VITAMINS PO) Take 1 capsule by mouth 2 (two) times daily.     . Polyvinyl Alcohol-Povidone (REFRESH OP) Apply 1 drop to eye daily.    . ramipril (ALTACE) 2.5 MG capsule Take 1 capsule (2.5 mg  total) by mouth daily. 90 capsule 3  . warfarin (COUMADIN) 5 MG tablet TAKE 1 TABLET DAILY OR AS DIRECTED BY COUMADIN CLINIC 90 tablet 1   No current facility-administered medications for this encounter.    BP 137/70   Pulse 75   Wt 212 lb 4 oz (96.3 kg)   SpO2 98%   BMI 36.43 kg/m   Filed Weights   12/29/17 1201  Weight: 212 lb 4 oz (96.3 kg)    General: Elderly, in wheelchair No  resp difficulty. HEENT: Normal Neck: Supple. JVP ~8. Carotids 2+ bilat; no bruits. No thyromegaly or nodule noted. Cor: PMI nondisplaced. IRR, 3/6 SEM RUSB with clear S2 Lungs: CTAB, normal effort. Abdomen: Soft, non-tender, non-distended, no HSM. No bruits or masses. +BS  Extremities: No cyanosis, clubbing, or rash. R and LLE trace ankle edema Neuro: Alert & orientedx3, cranial nerves grossly intact. moves all 4 extremities w/o difficulty. Affect pleasant  Assessment/Plan: 1. Atrial fibrillation: Permanent. She is on warfarin with no problems. Rate controlled with Toprol XL. No s/s bleeding.  2. Chronic diastolic CHF: With prominent RV failure. [place] in 4/18 showed pulmonary venous hypertension with elevated left and right heart filling pressures.  NYHA class II-III, seems more limited by knees than breathing.  - Volume status improved. Weight is down 6 lbs since last appointment - Continue Lasix 40 mg BID. BMET today.  3. Aortic stenosis: moderate AS by Echo 10/2017. Monitor annual echo. May need to consider for TAVR at some point. 4. Pulmonary hypertension: She has group 2 PH due to LV diastolic dysfunction/elevated LA pressure (pulmonary venous hypertension).   - Continue lasix as above. No change.   BMET today  Georgiana Shore, NP 12/29/2017   Patient seen with the above note.  She has been doing better on increased Lasix. Weight down 6 lbs.  Breathing overall better.  On exam, she does not look volume overloaded.  - Continue current Lasix 40 mg bid, will get BMET.   Recent echo with moderate AS,  will repeat echo in 1 year.    followup in 3 months.   Loralie Champagne 12/30/2017

## 2018-01-20 ENCOUNTER — Ambulatory Visit (INDEPENDENT_AMBULATORY_CARE_PROVIDER_SITE_OTHER): Payer: Medicare HMO | Admitting: *Deleted

## 2018-01-20 DIAGNOSIS — Z5181 Encounter for therapeutic drug level monitoring: Secondary | ICD-10-CM | POA: Diagnosis not present

## 2018-01-20 DIAGNOSIS — I482 Chronic atrial fibrillation: Secondary | ICD-10-CM | POA: Diagnosis not present

## 2018-01-20 DIAGNOSIS — I4821 Permanent atrial fibrillation: Secondary | ICD-10-CM

## 2018-01-20 LAB — POCT INR: INR: 2.2 (ref 2.0–3.0)

## 2018-01-20 NOTE — Patient Instructions (Signed)
Description   Continue on same dosage 1 tablet daily except 1/2 tablet on Tuesdays, Thursdays and Saturdays. Recheck in 6 weeks. Call [phone] if scheduled for any procedure or any change in medications     

## 2018-01-26 DIAGNOSIS — M1712 Unilateral primary osteoarthritis, left knee: Secondary | ICD-10-CM | POA: Diagnosis not present

## 2018-01-26 DIAGNOSIS — M1711 Unilateral primary osteoarthritis, right knee: Secondary | ICD-10-CM | POA: Diagnosis not present

## 2018-03-03 ENCOUNTER — Ambulatory Visit (INDEPENDENT_AMBULATORY_CARE_PROVIDER_SITE_OTHER): Payer: Medicare HMO | Admitting: *Deleted

## 2018-03-03 DIAGNOSIS — I482 Chronic atrial fibrillation: Secondary | ICD-10-CM

## 2018-03-03 DIAGNOSIS — I4821 Permanent atrial fibrillation: Secondary | ICD-10-CM

## 2018-03-03 DIAGNOSIS — Z5181 Encounter for therapeutic drug level monitoring: Secondary | ICD-10-CM

## 2018-03-03 LAB — POCT INR: INR: 2.8 (ref 2.0–3.0)

## 2018-03-03 NOTE — Patient Instructions (Signed)
Description   Continue on same dosage 1 tablet daily except 1/2 tablet on Tuesdays, Thursdays and Saturdays. Recheck in 6 weeks. Call [phone] if scheduled for any procedure or any change in medications

## 2018-03-09 DIAGNOSIS — H02834 Dermatochalasis of left upper eyelid: Secondary | ICD-10-CM | POA: Diagnosis not present

## 2018-03-09 DIAGNOSIS — H353131 Nonexudative age-related macular degeneration, bilateral, early dry stage: Secondary | ICD-10-CM | POA: Diagnosis not present

## 2018-03-09 DIAGNOSIS — H04123 Dry eye syndrome of bilateral lacrimal glands: Secondary | ICD-10-CM | POA: Diagnosis not present

## 2018-03-09 DIAGNOSIS — H02831 Dermatochalasis of right upper eyelid: Secondary | ICD-10-CM | POA: Diagnosis not present

## 2018-03-09 DIAGNOSIS — Z961 Presence of intraocular lens: Secondary | ICD-10-CM | POA: Diagnosis not present

## 2018-03-31 ENCOUNTER — Encounter: Payer: Self-pay | Admitting: Cardiology

## 2018-03-31 ENCOUNTER — Ambulatory Visit: Payer: Medicare HMO | Admitting: Cardiology

## 2018-03-31 VITALS — BP 120/60 | HR 89 | Ht 64.0 in | Wt 214.4 lb

## 2018-03-31 DIAGNOSIS — I5032 Chronic diastolic (congestive) heart failure: Secondary | ICD-10-CM | POA: Diagnosis not present

## 2018-03-31 DIAGNOSIS — I340 Nonrheumatic mitral (valve) insufficiency: Secondary | ICD-10-CM | POA: Diagnosis not present

## 2018-03-31 DIAGNOSIS — I350 Nonrheumatic aortic (valve) stenosis: Secondary | ICD-10-CM

## 2018-03-31 DIAGNOSIS — I272 Pulmonary hypertension, unspecified: Secondary | ICD-10-CM | POA: Diagnosis not present

## 2018-03-31 DIAGNOSIS — I10 Essential (primary) hypertension: Secondary | ICD-10-CM

## 2018-03-31 DIAGNOSIS — I4821 Permanent atrial fibrillation: Secondary | ICD-10-CM

## 2018-03-31 DIAGNOSIS — I482 Chronic atrial fibrillation: Secondary | ICD-10-CM | POA: Diagnosis not present

## 2018-03-31 MED ORDER — FUROSEMIDE 40 MG PO TABS: 40.0000 mg | ORAL_TABLET | Freq: Two times a day (BID) | ORAL | 0 refills | Status: DC

## 2018-03-31 MED ORDER — FUROSEMIDE 40 MG PO TABS: 40.0000 mg | ORAL_TABLET | Freq: Two times a day (BID) | ORAL | 3 refills | Status: DC

## 2018-03-31 NOTE — Progress Notes (Signed)
Cardiology Office Note:    Date:  03/31/2018   ID:  MEHA VIDRINE, DOB [birth year], MRN [id number]  PCP:  Darcus Austin, MD  Cardiologist:  Fransico Him, MD    Referring MD: Darcus Austin, MD   Chief Complaint  Patient presents with  . Congestive Heart Failure  . Hypertension  . Atrial Fibrillation  . Aortic Stenosis  . Mitral Regurgitation    History of Present Illness:    Sandra Merritt is a 84 y.o. female with a hx of permanentatrial fibrillation on systemic anticoagulation, moderate MR, mild AS, moderate pulmonary HTNand HTN .  She was evaluated by Dr. Aundra Dubin in AHF clinic for pulmonary HTN and DOE.  RHC showed elevated right and Left heart filling pressures c/w pulmonary venous HTN and diuretics were adjusted.  Her last echo showed normal LVF with severe LVH, moderate AS, midl MR with moderate MR, and right heart failure with moderate to severe pulmonary HTN.   She is here today for followup and is doing well.  She denies any chest pain or pressure, SOB, DOE, PND, orthopnea, LE edema, dizziness, palpitations or syncope. She is compliant with her meds and is tolerating meds with no SE. She brought in her weights today and they been fairly stable.  Based on our scales in the office she has dropped 6 pounds from January.  Past Medical History:  Diagnosis Date  . Aortic stenosis    mild by echo 2017  . CKD (chronic kidney disease), stage III ([place])   . Colon polyps   . COPD (chronic obstructive pulmonary disease) ([place])   . Dyslipidemia   . Hypertension   . Hypothyroid   . LFTs abnormal    History of abnormal LFTs due to Fatty Liver  . Macular degeneration   . Memory loss   . Mitral regurgitation    mdoerate by echo 07/2015  . Obesity   . OSA (obstructive sleep apnea)    intolerant to CPAP  . Osteoarthritis   . Osteopenia   . Permanent atrial fibrillation ([facility])    Chronic on coumadin  . Pulmonary HTN ([place])    moderate with PASP 65mmHg on echo 07/2015  . Right sided facial  pain   . Vitamin D deficiency     Past Surgical History:  Procedure Laterality Date  . BREAST BIOPSY    . CHOLECYSTECTOMY    . KNEE SURGERY Right   . RIGHT HEART CATH N/A 12/15/2016   Procedure: Right Heart Cath;  Surgeon: Larey Dresser, MD;  Location: [facility];  Service: Cardiovascular;  Laterality: N/A;    Current Medications: No outpatient medications have been marked as taking for the 03/31/18 encounter (Office Visit) with Sueanne Margarita, MD.     Allergies:   Milk-related compounds and Penicillins   Social History   Socioeconomic History  . Marital status: Married    Spouse name: Not on file  . Number of children: 3  . Years of education: HS  . Highest education level: Not on file  Occupational History  . Not on file  Social Needs  . Financial resource strain: Not on file  . Food insecurity:    Worry: Not on file    Inability: Not on file  . Transportation needs:    Medical: Not on file    Non-medical: Not on file  Tobacco Use  . Smoking status: Never Smoker  . Smokeless tobacco: Never Used  Substance and Sexual Activity  . Alcohol  use: No  . Drug use: No  . Sexual activity: Not on file  Lifestyle  . Physical activity:    Days per week: Not on file    Minutes per session: Not on file  . Stress: Not on file  Relationships  . Social connections:    Talks on phone: Not on file    Gets together: Not on file    Attends religious service: Not on file    Active member of club or organization: Not on file    Attends meetings of clubs or organizations: Not on file    Relationship status: Not on file  Other Topics Concern  . Not on file  Social History Narrative   Lives at home with two daughters and her friend.   Left-handed.   No caffeine use.     Family History: The patient's family history includes Diabetes in her sister; Heart disease in her father, mother, and sister.  ROS:   Please see the history of present illness.    ROS  All other  systems reviewed and negative.   EKGs/Labs/Other Studies Reviewed:    The following studies were reviewed today: none  EKG:  EKG is not ordered today.  Recent Labs: 12/29/2017: BUN 20; Creatinine, Ser 1.20; Potassium 4.4; Sodium 138   Recent Lipid Panel No results found for: CHOL, TRIG, HDL, CHOLHDL, VLDL, LDLCALC, LDLDIRECT  Physical Exam:    VS:  Ht 5\' 4"  (1.626 m)   BMI 36.43 kg/m     Wt Readings from Last 3 Encounters:  12/29/17 212 lb 4 oz (96.3 kg)  10/20/17 218 lb (98.9 kg)  09/23/17 220 lb 8 oz (100 kg)     GEN:  Well nourished, well developed in no acute distress HEENT: Normal NECK: No JVD; No carotid bruits LYMPHATICS: No lymphadenopathy CARDIAC: Irregularly irregular, no  rubs, gallops.  2/6 mid peaking systolic murmur at the right upper sternal border to the left lower sternal border RESPIRATORY:  Clear to auscultation without rales, wheezing or rhonchi  ABDOMEN: Soft, non-tender, non-distended MUSCULOSKELETAL:  No edema; No deformity  SKIN: Warm and dry NEUROLOGIC:  Alert and oriented x 3 PSYCHIATRIC:  Normal affect   ASSESSMENT:    1. Permanent atrial fibrillation ([place])   2. Benign essential HTN   3. Pulmonary HTN ([place])   4. Nonrheumatic aortic valve stenosis   5. Chronic diastolic heart failure ([place])   6. Non-rheumatic mitral regurgitation    PLAN:    In order of problems listed above:  1.  Permanent atrial fibrillation - heart rate is well controlled on exam today.  She denies any bleeding complications from her anticoagulation.  She will continue on warfarin as well as Toprol-XL 25 mg daily for rate control.  Her hemoglobin was 12.3 on 10/21/2017.  2.  Hypertension - BP is well controlled on exam today.  Continue Toprol-XL 25 mg daily and ramipril 2.5 mg daily.  Her creatinine was stable at 1.2 on 12/29/2017 and potassium 4.4.  3.  Pulmonary hypertension -followed by Dr. Algernon Huxley in advanced heart failure clinic.  PASP 65 mmHg on echo 10/2017  similar to PASP of 61 mmHg at cath a year ago.  Continue on diuretic therapy.  4.  Moderate aortic stenosis -2D echo on 10/20/2017 showed moderate aortic stenosis with an aortic valve area [place] to 1.28 cm with mean aortic valve gradient 22 mmHg   5.  Chronic diastolic heart failure -she appears euvolemic on exam today her weight is fairly  stable she is actually lost 6 pounds since January.  She has no lower extremity edema on exam today.Marland Kitchen  She will continue on diuretic therapy.  I encouraged her again to be compliant with low-sodium diet.  6.  Mild to moderate mitral regurgitation - noted on echo 10/2017.  Repeat echo 10/2018  to make sure this has not progressed.   Medication Adjustments/Labs and Tests Ordered: Current medicines are reviewed at length with the patient today.  Concerns regarding medicines are outlined above.  No orders of the defined types were placed in this encounter.  No orders of the defined types were placed in this encounter.   Signed, Fransico Him, MD  03/31/2018 8:48 AM    [place]

## 2018-03-31 NOTE — Patient Instructions (Signed)
Medication Instructions:  Your provider recommends that you continue on your current medications as directed. Please refer to the Current Medication list given to you today.     Labwork: None  Testing/Procedures: Your provider has requested that you have an echocardiogram in February, 2020. Echocardiography is a painless test that uses sound waves to create images of your heart. It provides your doctor with information about the size and shape of your heart and how well your heart's chambers and valves are working. This procedure takes approximately one hour. There are no restrictions for this procedure.  Follow-Up: Your provider wants you to follow-up in: 6 months with Dr. Radford Pax. You will receive a reminder letter in the mail two months in advance. If you don't receive a letter, please call our office to schedule the follow-up appointment.    Any Other Special Instructions Will Be Listed Below (If Applicable).     If you need a refill on your cardiac medications before your next appointment, please call your pharmacy.

## 2018-04-02 ENCOUNTER — Ambulatory Visit (HOSPITAL_COMMUNITY)
Admission: RE | Admit: 2018-04-02 | Discharge: 2018-04-02 | Disposition: A | Discharge status: Home / Self Care | Payer: Medicare HMO | Source: Ambulatory Visit | Attending: Cardiology | Admitting: Cardiology

## 2018-04-02 VITALS — BP 137/63 | HR 68 | Wt 214.4 lb

## 2018-04-02 DIAGNOSIS — I272 Pulmonary hypertension, unspecified: Secondary | ICD-10-CM | POA: Diagnosis not present

## 2018-04-02 DIAGNOSIS — Z7901 Long term (current) use of anticoagulants: Secondary | ICD-10-CM | POA: Diagnosis not present

## 2018-04-02 DIAGNOSIS — I482 Chronic atrial fibrillation: Secondary | ICD-10-CM | POA: Diagnosis not present

## 2018-04-02 DIAGNOSIS — E039 Hypothyroidism, unspecified: Secondary | ICD-10-CM | POA: Insufficient documentation

## 2018-04-02 DIAGNOSIS — N183 Chronic kidney disease, stage 3 (moderate): Secondary | ICD-10-CM | POA: Diagnosis not present

## 2018-04-02 DIAGNOSIS — Z79890 Hormone replacement therapy (postmenopausal): Secondary | ICD-10-CM | POA: Diagnosis not present

## 2018-04-02 DIAGNOSIS — I5032 Chronic diastolic (congestive) heart failure: Secondary | ICD-10-CM | POA: Diagnosis not present

## 2018-04-02 DIAGNOSIS — Z79899 Other long term (current) drug therapy: Secondary | ICD-10-CM | POA: Insufficient documentation

## 2018-04-02 DIAGNOSIS — Z8249 Family history of ischemic heart disease and other diseases of the circulatory system: Secondary | ICD-10-CM | POA: Insufficient documentation

## 2018-04-02 DIAGNOSIS — I130 Hypertensive heart and chronic kidney disease with heart failure and stage 1 through stage 4 chronic kidney disease, or unspecified chronic kidney disease: Secondary | ICD-10-CM | POA: Diagnosis not present

## 2018-04-02 DIAGNOSIS — G4733 Obstructive sleep apnea (adult) (pediatric): Secondary | ICD-10-CM | POA: Diagnosis not present

## 2018-04-02 DIAGNOSIS — E785 Hyperlipidemia, unspecified: Secondary | ICD-10-CM | POA: Insufficient documentation

## 2018-04-02 DIAGNOSIS — Z833 Family history of diabetes mellitus: Secondary | ICD-10-CM | POA: Diagnosis not present

## 2018-04-02 DIAGNOSIS — I350 Nonrheumatic aortic (valve) stenosis: Secondary | ICD-10-CM | POA: Diagnosis not present

## 2018-04-02 DIAGNOSIS — Z9049 Acquired absence of other specified parts of digestive tract: Secondary | ICD-10-CM | POA: Diagnosis not present

## 2018-04-02 DIAGNOSIS — Z7983 Long term (current) use of bisphosphonates: Secondary | ICD-10-CM | POA: Diagnosis not present

## 2018-04-02 DIAGNOSIS — I4821 Permanent atrial fibrillation: Secondary | ICD-10-CM

## 2018-04-02 LAB — CBC
HEMATOCRIT: 34.6 % — AB (ref 36.0–46.0)
HEMOGLOBIN: 11.1 g/dL — AB (ref 12.0–15.0)
MCH: 35.2 pg — ABNORMAL HIGH (ref 26.0–34.0)
MCHC: 32.1 g/dL (ref 30.0–36.0)
MCV: 109.8 fL — ABNORMAL HIGH (ref 78.0–100.0)
Platelets: 89 10*3/uL — ABNORMAL LOW (ref 150–400)
RBC: 3.15 MIL/uL — ABNORMAL LOW (ref 3.87–5.11)
RDW: 13.6 % (ref 11.5–15.5)
WBC: 4.1 10*3/uL (ref 4.0–10.5)

## 2018-04-02 LAB — BASIC METABOLIC PANEL
ANION GAP: 8 (ref 5–15)
BUN: 23 mg/dL (ref 8–23)
CO2: 29 mmol/L (ref 22–32)
Calcium: 8.9 mg/dL (ref 8.9–10.3)
Chloride: 104 mmol/L (ref 98–111)
Creatinine, Ser: 1.17 mg/dL — ABNORMAL HIGH (ref 0.44–1.00)
GFR calc Af Amer: 48 mL/min — ABNORMAL LOW (ref >60)
GFR calc non Af Amer: 42 mL/min — ABNORMAL LOW (ref >60)
Glucose, Bld: 99 mg/dL (ref 70–99)
Potassium: 4.3 mmol/L (ref 3.5–5.1)
Sodium: 141 mmol/L (ref 135–145)

## 2018-04-02 MED ORDER — FUROSEMIDE 40 MG PO TABS: 60.0000 mg | ORAL_TABLET | Freq: Two times a day (BID) | ORAL | 3 refills | Status: DC

## 2018-04-02 NOTE — Patient Instructions (Signed)
INCREASE Lasix to 60 mg (1.5 tabs) twice daily.  Routine lab work today. Will notify you of abnormal results, otherwise no news is good news!  Return in 1-2 weeks for repeat labs.  Follow up 4 weeks with Dr. Claris Gladden PA/NP.  Follow up 4 months with Dr. Aundra Dubin.  Take all medication as prescribed the day of your appointment. Bring all medications with you to your appointment.  Do the following things EVERYDAY: 1) Weigh yourself in the morning before breakfast. Write it down and keep it in a log. 2) Take your medicines as prescribed 3) Eat low salt foods-Limit salt (sodium) to 2000 mg per day.  4) Stay as active as you can everyday 5) Limit all fluids for the day to less than 2 liters

## 2018-04-03 NOTE — Progress Notes (Signed)
PCP: Dr. Darcus Austin Primary Cardiology: Dr. Radford Pax HF Cardiology: Dr. Aundra Dubin  84 y.o. with history of permanent atrial fibrillation and diastolic CHF presents for CHF clinic followup.  She has been followed by Dr. Radford Pax.  She has had atrial fibrillation since 2002, cardioversion was unsuccessful at that time.  She has done reasonably well over the years, but more recently developed progressive exercise intolerance.  She is limited by knee arthritis and low back pain, but clearly became more short of breath over the last 6 months. She is not very active.  When I saw her initially, she was short of breath walking up any stairs. She was short of breath dressing.  She was short of breath walking longer distances around her house.  No orthopnea/PND.  No chest pain.    I increased her Lasix. RHC in 4/18 showed elevated right and left heart filling pressures and pulmonary venous hypertension.  Weight dropped 20 lbs and I backed off on her Lasix to 40 qam/20 qpm alternating with 40 mg bid with rise in BUN/creatinine.    Echo 2/19 EF 55-60%, moderate LVH, PASP 65 mmHg, moderate AS with mean gradient 23 mmHg, severe biatrial enlargement.   She returns today for followup of CHF.  Weight is up 2 lbs. No dyspnea walking around the house but not very active.  She gets short of breath walking outside the house for longer distances.  No orthopnea/PND.  No chest pain.    Labs (1/18): creatinine 1.06 Labs (4/18): K 3.9, creatinine 1.04 Labs (5/18): K 4.2, creatinine 1.18 Labs (9/18): K 4.2, creatinine 1.23 Labs (1/19): K 4.4, creatinine 1.30 Labs (2/19): K 4.3, creatinine 1.15 Labs (3/19): K 4.4, creatinine 1.22 Labs (4/19): K 4.4, creatinine 1.2  PMH: 1. Atrial fibrillation: Permanent. Present since 2002.  2. H/o cholecystectomy  3. Hypothyroidism 4. HTN 5. Hyperlipidemia 6. OSA: Cannot tolerate CPAP.  7. CKD stage III 8. Chronic diastolic CHF: Echo (9/38) with EF 50-55%, moderate to severe LVH,  moderate AS (AVA 1.0 cm^2, mean gradient 21 mmHg), mild AI, mild RV dilation with moderately decreased RV systolic function, moderate to severe TR, PASP 69 mmHg, moderate mitral regurgitation.  - RHC (4/18): mean RA 14, PA 61/24 mean 41, mean PCWP 28, CI 3.48, PVR 1.8 WU.  - Echo (2/19): EF 55-60%, moderate LVH, mildly dilated RV with normal systolic function, PASP 65 mmHg, moderate AS with mean gradient 23 mmHg and AVA 1.2 cm^2, mild to moderate MR, severe biatrial enlargement, dilated IVC.  9. Aortic stenosis: Moderate on last echo in 2/19.   Social History   Socioeconomic History  . Marital status: Married    Spouse name: Not on file  . Number of children: 3  . Years of education: HS  . Highest education level: Not on file  Occupational History  . Not on file  Social Needs  . Financial resource strain: Not on file  . Food insecurity:    Worry: Not on file    Inability: Not on file  . Transportation needs:    Medical: Not on file    Non-medical: Not on file  Tobacco Use  . Smoking status: Never Smoker  . Smokeless tobacco: Never Used  Substance and Sexual Activity  . Alcohol use: No  . Drug use: No  . Sexual activity: Not on file  Lifestyle  . Physical activity:    Days per week: Not on file    Minutes per session: Not on file  . Stress: Not  on file  Relationships  . Social connections:    Talks on phone: Not on file    Gets together: Not on file    Attends religious service: Not on file    Active member of club or organization: Not on file    Attends meetings of clubs or organizations: Not on file    Relationship status: Not on file  . Intimate partner violence:    Fear of current or ex partner: Not on file    Emotionally abused: Not on file    Physically abused: Not on file    Forced sexual activity: Not on file  Other Topics Concern  . Not on file  Social History Narrative   Lives at home with two daughters and her friend.   Left-handed.   No caffeine use.    Family History  Problem Relation Age of Onset  . Heart disease Mother   . Heart disease Father   . Heart disease Sister   . Diabetes Sister    ROS: All systems reviewed and negative except as per HPI.  Current Outpatient Medications  Medication Sig Dispense Refill  . acetaminophen (TYLENOL) 500 MG tablet Take 1,000-2,000 mg by mouth 2 (two) times daily as needed for moderate pain or headache.     . alendronate (FOSAMAX) 70 MG tablet Take 70 mg by mouth every Wednesday.   11  . Calcium Carb-Cholecalciferol (CALCIUM + D3) 600-200 MG-UNIT TABS Take 1 tablet by mouth in the morning and 2 tablets at night    . dextromethorphan (DELSYM) 30 MG/5ML liquid Take 30 mg by mouth as needed for cough.    . furosemide (LASIX) 40 MG tablet Take 1.5 tablets (60 mg total) by mouth 2 (two) times daily. 270 tablet 3  . gabapentin (NEURONTIN) 100 MG capsule Take 1 capsule by mouth 3 (three) times daily.    Marland Kitchen glucosamine-chondroitin 500-400 MG tablet Take 2 tablets in the morning and 1 tablet at night    . levothyroxine (SYNTHROID, LEVOTHROID) 137 MCG tablet Take 137 mcg by mouth daily before breakfast.    . loratadine (CLARITIN) 10 MG tablet Take 10 mg by mouth daily as needed for allergies.    Marland Kitchen lovastatin (MEVACOR) 40 MG tablet Take 40 mg by mouth at bedtime.    . metoprolol succinate (TOPROL-XL) 25 MG 24 hr tablet Take 25 mg by mouth daily.    . Multiple Vitamin (MULTIVITAMIN) tablet Take 1 tablet by mouth daily.    . Multiple Vitamins-Minerals (EYE VITAMINS PO) Take 1 capsule by mouth 2 (two) times daily.     . Polyvinyl Alcohol-Povidone (REFRESH OP) Apply 1 drop to eye daily.    . ramipril (ALTACE) 2.5 MG capsule Take 1 capsule (2.5 mg total) by mouth daily. 90 capsule 3  . warfarin (COUMADIN) 5 MG tablet TAKE 1 TABLET DAILY OR AS DIRECTED BY COUMADIN CLINIC 90 tablet 1   No current facility-administered medications for this encounter.    BP 137/63   Pulse 68   Wt 214 lb 6.4 oz (97.3 kg)   SpO2  94%   BMI 36.80 kg/m   Filed Weights   04/02/18 0932  Weight: 214 lb 6.4 oz (97.3 kg)    General: NAD Neck: JVP 8-9 cm, no thyromegaly or thyroid nodule.  Lungs: Clear to auscultation bilaterally with normal respiratory effort. CV: Nondisplaced PMI.  Heart irregular S1/S2, no S3/S4, 2/6 SEM RUSB with clear S2.  1+ ankle edema.  No carotid bruit.  Normal pedal  pulses.  Abdomen: Soft, nontender, no hepatosplenomegaly, no distention.  Skin: Intact without lesions or rashes.  Neurologic: Alert and oriented x 3.  Psych: Normal affect. Extremities: No clubbing or cyanosis.  HEENT: Normal.   Assessment/Plan: 1. Atrial fibrillation: Permanent. She is on warfarin with no problems. Rate controlled with Toprol XL. - Continue warfarin. CBC today.  2. Chronic diastolic CHF: With prominent RV failure. [facility] in 4/18 showed pulmonary venous hypertension with elevated left and right heart filling pressures.  NYHA class III symptoms.  She is at least mildly volume overloaded by exam today.  - Increase Lasix to 60 mg bid.  BMET today and in 10 days.  3. Aortic stenosis: moderate AS by Echo 10/2017. Follow.  4. Pulmonary hypertension: She has group 2 PH due to LV diastolic dysfunction/elevated LA pressure (pulmonary venous hypertension).   - Continue lasix as above. No change.   Followup with APP in 1 month.   Loralie Champagne, MD 04/03/2018

## 2018-04-09 ENCOUNTER — Ambulatory Visit (HOSPITAL_COMMUNITY)
Admission: RE | Admit: 2018-04-09 | Discharge: 2018-04-09 | Disposition: A | Discharge status: Home / Self Care | Payer: Medicare HMO | Source: Ambulatory Visit | Attending: Cardiology | Admitting: Cardiology

## 2018-04-09 DIAGNOSIS — I5032 Chronic diastolic (congestive) heart failure: Secondary | ICD-10-CM | POA: Diagnosis not present

## 2018-04-09 LAB — BASIC METABOLIC PANEL
Anion gap: 11 (ref 5–15)
BUN: 21 mg/dL (ref 8–23)
CHLORIDE: 99 mmol/L (ref 98–111)
CO2: 30 mmol/L (ref 22–32)
Calcium: 9.3 mg/dL (ref 8.9–10.3)
Creatinine, Ser: 1.23 mg/dL — ABNORMAL HIGH (ref 0.44–1.00)
GFR calc non Af Amer: 39 mL/min — ABNORMAL LOW (ref >60)
GFR, EST AFRICAN AMERICAN: 45 mL/min — AB (ref >60)
GLUCOSE: 91 mg/dL (ref 70–99)
Potassium: 4.4 mmol/L (ref 3.5–5.1)
Sodium: 140 mmol/L (ref 135–145)

## 2018-04-14 ENCOUNTER — Ambulatory Visit (INDEPENDENT_AMBULATORY_CARE_PROVIDER_SITE_OTHER): Payer: Medicare HMO

## 2018-04-14 DIAGNOSIS — I482 Chronic atrial fibrillation: Secondary | ICD-10-CM | POA: Diagnosis not present

## 2018-04-14 DIAGNOSIS — I4821 Permanent atrial fibrillation: Secondary | ICD-10-CM

## 2018-04-14 DIAGNOSIS — Z5181 Encounter for therapeutic drug level monitoring: Secondary | ICD-10-CM | POA: Diagnosis not present

## 2018-04-14 LAB — POCT INR: INR: 2.4 (ref 2.0–3.0)

## 2018-04-14 NOTE — Patient Instructions (Signed)
Description   Continue on same dosage 1 tablet daily except 1/2 tablet on Tuesdays, Thursdays and Saturdays. Recheck in 6 weeks. Call [phone] if scheduled for any procedure or any change in medications

## 2018-04-19 ENCOUNTER — Other Ambulatory Visit: Payer: Self-pay | Admitting: Cardiology

## 2018-04-22 DIAGNOSIS — E7800 Pure hypercholesterolemia, unspecified: Secondary | ICD-10-CM | POA: Diagnosis not present

## 2018-04-22 DIAGNOSIS — G500 Trigeminal neuralgia: Secondary | ICD-10-CM | POA: Diagnosis not present

## 2018-04-22 DIAGNOSIS — E039 Hypothyroidism, unspecified: Secondary | ICD-10-CM | POA: Diagnosis not present

## 2018-04-22 DIAGNOSIS — N183 Chronic kidney disease, stage 3 (moderate): Secondary | ICD-10-CM | POA: Diagnosis not present

## 2018-04-22 DIAGNOSIS — I10 Essential (primary) hypertension: Secondary | ICD-10-CM | POA: Diagnosis not present

## 2018-04-22 DIAGNOSIS — E559 Vitamin D deficiency, unspecified: Secondary | ICD-10-CM | POA: Diagnosis not present

## 2018-04-27 DIAGNOSIS — M1711 Unilateral primary osteoarthritis, right knee: Secondary | ICD-10-CM | POA: Diagnosis not present

## 2018-04-27 DIAGNOSIS — M1712 Unilateral primary osteoarthritis, left knee: Secondary | ICD-10-CM | POA: Diagnosis not present

## 2018-04-27 DIAGNOSIS — M170 Bilateral primary osteoarthritis of knee: Secondary | ICD-10-CM | POA: Diagnosis not present

## 2018-04-28 ENCOUNTER — Other Ambulatory Visit: Payer: Self-pay | Admitting: Cardiology

## 2018-04-28 NOTE — Telephone Encounter (Signed)
This is Dr. Mclean's pt. °

## 2018-04-30 ENCOUNTER — Encounter (HOSPITAL_COMMUNITY): Payer: Self-pay

## 2018-04-30 ENCOUNTER — Ambulatory Visit (HOSPITAL_COMMUNITY)
Admission: RE | Admit: 2018-04-30 | Discharge: 2018-04-30 | Disposition: A | Discharge status: Home / Self Care | Payer: Medicare HMO | Source: Ambulatory Visit | Attending: Internal Medicine | Admitting: Internal Medicine

## 2018-04-30 VITALS — BP 128/62 | HR 79 | Wt 211.0 lb

## 2018-04-30 DIAGNOSIS — E039 Hypothyroidism, unspecified: Secondary | ICD-10-CM | POA: Diagnosis not present

## 2018-04-30 DIAGNOSIS — Z79890 Hormone replacement therapy (postmenopausal): Secondary | ICD-10-CM | POA: Diagnosis not present

## 2018-04-30 DIAGNOSIS — I350 Nonrheumatic aortic (valve) stenosis: Secondary | ICD-10-CM

## 2018-04-30 DIAGNOSIS — I5032 Chronic diastolic (congestive) heart failure: Secondary | ICD-10-CM | POA: Insufficient documentation

## 2018-04-30 DIAGNOSIS — I10 Essential (primary) hypertension: Secondary | ICD-10-CM | POA: Diagnosis not present

## 2018-04-30 DIAGNOSIS — Z79899 Other long term (current) drug therapy: Secondary | ICD-10-CM | POA: Diagnosis not present

## 2018-04-30 DIAGNOSIS — Z7901 Long term (current) use of anticoagulants: Secondary | ICD-10-CM | POA: Insufficient documentation

## 2018-04-30 DIAGNOSIS — I130 Hypertensive heart and chronic kidney disease with heart failure and stage 1 through stage 4 chronic kidney disease, or unspecified chronic kidney disease: Secondary | ICD-10-CM | POA: Diagnosis not present

## 2018-04-30 DIAGNOSIS — E785 Hyperlipidemia, unspecified: Secondary | ICD-10-CM | POA: Diagnosis not present

## 2018-04-30 DIAGNOSIS — I272 Pulmonary hypertension, unspecified: Secondary | ICD-10-CM | POA: Diagnosis not present

## 2018-04-30 DIAGNOSIS — I080 Rheumatic disorders of both mitral and aortic valves: Secondary | ICD-10-CM | POA: Insufficient documentation

## 2018-04-30 DIAGNOSIS — G4733 Obstructive sleep apnea (adult) (pediatric): Secondary | ICD-10-CM | POA: Diagnosis not present

## 2018-04-30 DIAGNOSIS — Z8249 Family history of ischemic heart disease and other diseases of the circulatory system: Secondary | ICD-10-CM | POA: Diagnosis not present

## 2018-04-30 DIAGNOSIS — N183 Chronic kidney disease, stage 3 (moderate): Secondary | ICD-10-CM | POA: Diagnosis not present

## 2018-04-30 DIAGNOSIS — I4821 Permanent atrial fibrillation: Secondary | ICD-10-CM

## 2018-04-30 DIAGNOSIS — Z833 Family history of diabetes mellitus: Secondary | ICD-10-CM | POA: Insufficient documentation

## 2018-04-30 DIAGNOSIS — Z9049 Acquired absence of other specified parts of digestive tract: Secondary | ICD-10-CM | POA: Insufficient documentation

## 2018-04-30 DIAGNOSIS — I482 Chronic atrial fibrillation: Secondary | ICD-10-CM | POA: Diagnosis not present

## 2018-04-30 NOTE — Progress Notes (Signed)
Advanced Heart Failure Clinic Note PCP: Dr. Darcus Austin Primary Cardiology: Dr. Radford Pax HF Cardiology: Dr. Marrianne Mood Sandra a 84 y.o. [place]  with history of permanent atrial fibrillation and diastolic CHF presents for CHF clinic followup.  She has been followed by Dr. Radford Pax.  She has had atrial fibrillation since 2002, cardioversion was unsuccessful at that time.  She has done reasonably well over the years, but more recently developed progressive exercise intolerance.  She Sandra limited by knee arthritis and low back pain, but clearly became more short of breath over the last 6 months. She Sandra not very active.  When seen initially, she was short of breath walking up any stairs. She was short of breath dressing.  She was short of breath walking longer distances around her house.  No orthopnea/PND.  No chest pain.    Increased her Lasix. RHC in 4/18 showed elevated right and left heart filling pressures and pulmonary venous hypertension.  Weight dropped 20 lbs and I backed off on her Lasix to 40 qam/20 qpm alternating with 40 mg bid with rise in BUN/creatinine.    Echo 2/19 EF 55-60%, moderate LVH, PASP 65 mmHg, moderate AS with mean gradient 23 mmHg, severe biatrial enlargement.   She presents today for follow up. Last visit lasix increased. She Sandra feeling good overall. Weight at home stable 201 - 205 lbs. They had dinner out last night, so slightly higher this am. She denies DOE walking around the house. No orthopnea/PND. No chest pain. She denies lightheadedness with standing. At her PCP visit 04/22/18 they report BP in 90s, but denies lightheadedness or dizziness, and was her usual SBP 100-110s at home. Hasn't needed any extra diuretic.   Labs (3/19): K 4.4, creatinine 1.22 Labs (4/19): K 4.4, creatinine 1.2 Labs 04/22/18: K 4.3, Cr 1.19  PMH: 1. Atrial fibrillation: Permanent. Present since 2002.  2. H/o cholecystectomy  3. Hypothyroidism 4. HTN 5. Hyperlipidemia 6. OSA: Cannot  tolerate CPAP.  7. CKD stage III 8. Chronic diastolic CHF: Echo (8/12) with EF 50-55%, moderate to severe LVH, moderate AS (AVA 1.0 cm^2, mean gradient 21 mmHg), mild AI, mild RV dilation with moderately decreased RV systolic function, moderate to severe TR, PASP 69 mmHg, moderate mitral regurgitation.  - RHC (4/18): mean RA 14, PA 61/24 mean 41, mean PCWP 28, CI 3.48, PVR 1.8 WU.  - Echo (2/19): EF 55-60%, moderate LVH, mildly dilated RV with normal systolic function, PASP 65 mmHg, moderate AS with mean gradient 23 mmHg and AVA 1.2 cm^2, mild to moderate MR, severe biatrial enlargement, dilated IVC.  9. Aortic stenosis: Moderate on last echo in 2/19.   Social History   Socioeconomic History  . Marital status: Married    Spouse name: Not on file  . Number of children: 3  . Years of education: HS  . Highest education level: Not on file  Occupational History  . Not on file  Social Needs  . Financial resource strain: Not on file  . Food insecurity:    Worry: Not on file    Inability: Not on file  . Transportation needs:    Medical: Not on file    Non-medical: Not on file  Tobacco Use  . Smoking status: Never Smoker  . Smokeless tobacco: Never Used  Substance and Sexual Activity  . Alcohol use: No  . Drug use: No  . Sexual activity: Not on file  Lifestyle  . Physical activity:    Days per week:  Not on file    Minutes per session: Not on file  . Stress: Not on file  Relationships  . Social connections:    Talks on phone: Not on file    Gets together: Not on file    Attends religious service: Not on file    Active member of club or organization: Not on file    Attends meetings of clubs or organizations: Not on file    Relationship status: Not on file  . Intimate partner violence:    Fear of current or ex partner: Not on file    Emotionally abused: Not on file    Physically abused: Not on file    Forced sexual activity: Not on file  Other Topics Concern  . Not on file    Social History Narrative   Lives at home with two daughters and her friend.   Left-handed.   No caffeine use.   Family History  Problem Relation Age of Onset  . Heart disease Mother   . Heart disease Father   . Heart disease Sister   . Diabetes Sister    Review of systems complete and found to be negative unless listed in HPI.    Current Outpatient Medications  Medication Sig Dispense Refill  . acetaminophen (TYLENOL) 500 MG tablet Take 1,000-2,000 mg by mouth 2 (two) times daily as needed for moderate pain or headache.     . alendronate (FOSAMAX) 70 MG tablet Take 70 mg by mouth every Wednesday.   11  . Calcium Carb-Cholecalciferol (CALCIUM + D3) 600-200 MG-UNIT TABS Take 1 tablet by mouth in the morning and 2 tablets at night    . dextromethorphan (DELSYM) 30 MG/5ML liquid Take 30 mg by mouth as needed for cough.    . furosemide (LASIX) 40 MG tablet Take 1.5 tablets (60 mg total) by mouth 2 (two) times daily. 270 tablet 3  . gabapentin (NEURONTIN) 100 MG capsule Take 1 capsule by mouth 3 (three) times daily.    Marland Kitchen glucosamine-chondroitin 500-400 MG tablet Take 2 tablets in the morning and 1 tablet at night    . levothyroxine (SYNTHROID, LEVOTHROID) 137 MCG tablet Take 137 mcg by mouth daily before breakfast.    . loratadine (CLARITIN) 10 MG tablet Take 10 mg by mouth daily as needed for allergies.    Marland Kitchen lovastatin (MEVACOR) 40 MG tablet Take 40 mg by mouth at bedtime.    . metoprolol succinate (TOPROL-XL) 25 MG 24 hr tablet Take 25 mg by mouth daily.    . Multiple Vitamin (MULTIVITAMIN) tablet Take 1 tablet by mouth daily.    . Multiple Vitamins-Minerals (EYE VITAMINS PO) Take 1 capsule by mouth 2 (two) times daily.     . Polyvinyl Alcohol-Povidone (REFRESH OP) Apply 1 drop to eye daily.    . ramipril (ALTACE) 2.5 MG capsule Take 1 capsule (2.5 mg total) by mouth daily. 90 capsule 3  . warfarin (COUMADIN) 5 MG tablet TAKE 1 TABLET DAILY OR AS DIRECTED BY COUMADIN CLINIC 90 tablet 1    No current facility-administered medications for this encounter.    Vitals:   04/30/18 1034  BP: 128/62  Pulse: 79  SpO2: 96%  Weight: 95.7 kg (211 lb)     Wt Readings from Last 3 Encounters:  04/30/18 95.7 kg (211 lb)  04/02/18 97.3 kg (214 lb 6.4 oz)  03/31/18 97.3 kg (214 lb 6.4 oz)    General: Elderly appearing. No resp difficulty. HEENT: Normal Neck: Supple. JVP 5-6. Carotids  2+ bilat; no bruits. No thyromegaly or nodule noted. Cor: PMI nondisplaced. RRR, 2/6 SEM RUSB with clear S2.  Lungs: CTAB, normal effort. Abdomen: Soft, non-tender, non-distended, no HSM. No bruits or masses. +BS  Extremities: No cyanosis, clubbing, or rash. Trace ankle edema.  Neuro: Alert & orientedx3, cranial nerves grossly intact. moves all 4 extremities w/o difficulty. Affect pleasant   Assessment/Plan: 1. Atrial fibrillation: Permanent.  - She Sandra on warfarin and denies bleeding.  Rate controlled with Toprol XL. - Continue warfarin. CBC stable earlier this month.  2. Chronic diastolic CHF: With prominent RV failure. -  RHC in 4/18 showed pulmonary venous hypertension with elevated left and right heart filling pressures.   - NYHA III chronically - Volume status stable on exam.  - Continue Lasix 60 mg bid.   - Reinforced fluid restriction to < 2 L daily, sodium restriction to less than 2000 mg daily, and the importance of daily weights.   3. Aortic stenosis:  - Moderate AS by Echo 10/2017. Follow 4. Pulmonary hypertension:  - She has group 2 PH due to LV diastolic dysfunction/elevated LA pressure (pulmonary venous hypertension).   - Continue lasix as above. No change.  5. HTN - Continue ramipril 2.5 mg daily  Keep 2 month f/u with Dr. Aundra Dubin. Labs 04/22/18 stable as above.   Shirley Friar, PA-C 04/30/2018

## 2018-04-30 NOTE — Patient Instructions (Signed)
It was great to see you today. No medication changes  Please keep your follow up appointment as scheduled  Do the following things EVERYDAY: 1) Weigh yourself in the morning before breakfast. Write it down and keep it in a log. 2) Take your medicines as prescribed 3) Eat low salt foods-Limit salt (sodium) to 2000 mg per day.  4) Stay as active as you can everyday 5) Limit all fluids for the day to less than 2 liters

## 2018-05-07 DIAGNOSIS — Z23 Encounter for immunization: Secondary | ICD-10-CM | POA: Diagnosis not present

## 2018-05-26 ENCOUNTER — Ambulatory Visit (INDEPENDENT_AMBULATORY_CARE_PROVIDER_SITE_OTHER): Payer: Medicare HMO | Admitting: *Deleted

## 2018-05-26 DIAGNOSIS — I4821 Permanent atrial fibrillation: Secondary | ICD-10-CM

## 2018-05-26 DIAGNOSIS — Z5181 Encounter for therapeutic drug level monitoring: Secondary | ICD-10-CM | POA: Diagnosis not present

## 2018-05-26 DIAGNOSIS — I482 Chronic atrial fibrillation: Secondary | ICD-10-CM

## 2018-05-26 LAB — POCT INR: INR: 2.5 (ref 2.0–3.0)

## 2018-05-26 NOTE — Patient Instructions (Signed)
Description   Continue on same dosage 1 tablet daily except 1/2 tablet on Tuesdays, Thursdays and Saturdays. Recheck in 6 weeks. Call [phone] if scheduled for any procedure or any change in medications

## 2018-07-07 ENCOUNTER — Encounter (INDEPENDENT_AMBULATORY_CARE_PROVIDER_SITE_OTHER): Payer: Self-pay

## 2018-07-07 ENCOUNTER — Ambulatory Visit (INDEPENDENT_AMBULATORY_CARE_PROVIDER_SITE_OTHER): Payer: Medicare HMO | Admitting: Pharmacist

## 2018-07-07 DIAGNOSIS — I4821 Permanent atrial fibrillation: Secondary | ICD-10-CM

## 2018-07-07 DIAGNOSIS — Z5181 Encounter for therapeutic drug level monitoring: Secondary | ICD-10-CM | POA: Diagnosis not present

## 2018-07-07 LAB — POCT INR: INR: 1.8 — AB (ref 2.0–3.0)

## 2018-07-07 NOTE — Patient Instructions (Signed)
Description   Take 1.5 tablets today then Continue on same dosage 1 tablet daily except 1/2 tablet on Tuesdays, Thursdays and Saturdays. Recheck in 4 weeks. Call [phone] if scheduled for any procedure or any change in medications

## 2018-08-03 ENCOUNTER — Other Ambulatory Visit: Payer: Self-pay

## 2018-08-03 ENCOUNTER — Ambulatory Visit (INDEPENDENT_AMBULATORY_CARE_PROVIDER_SITE_OTHER): Payer: Medicare HMO | Admitting: Pharmacist

## 2018-08-03 ENCOUNTER — Ambulatory Visit (HOSPITAL_COMMUNITY)
Admission: RE | Admit: 2018-08-03 | Discharge: 2018-08-03 | Disposition: A | Discharge status: Home / Self Care | Payer: Medicare HMO | Source: Ambulatory Visit | Attending: Cardiology | Admitting: Cardiology

## 2018-08-03 VITALS — BP 144/76 | HR 78 | Wt 214.2 lb

## 2018-08-03 DIAGNOSIS — Z7901 Long term (current) use of anticoagulants: Secondary | ICD-10-CM | POA: Diagnosis not present

## 2018-08-03 DIAGNOSIS — Z5181 Encounter for therapeutic drug level monitoring: Secondary | ICD-10-CM | POA: Diagnosis not present

## 2018-08-03 DIAGNOSIS — E785 Hyperlipidemia, unspecified: Secondary | ICD-10-CM | POA: Insufficient documentation

## 2018-08-03 DIAGNOSIS — E039 Hypothyroidism, unspecified: Secondary | ICD-10-CM | POA: Diagnosis not present

## 2018-08-03 DIAGNOSIS — I5032 Chronic diastolic (congestive) heart failure: Secondary | ICD-10-CM | POA: Diagnosis not present

## 2018-08-03 DIAGNOSIS — Z9049 Acquired absence of other specified parts of digestive tract: Secondary | ICD-10-CM | POA: Insufficient documentation

## 2018-08-03 DIAGNOSIS — I272 Pulmonary hypertension, unspecified: Secondary | ICD-10-CM | POA: Insufficient documentation

## 2018-08-03 DIAGNOSIS — Z8249 Family history of ischemic heart disease and other diseases of the circulatory system: Secondary | ICD-10-CM | POA: Diagnosis not present

## 2018-08-03 DIAGNOSIS — I4821 Permanent atrial fibrillation: Secondary | ICD-10-CM

## 2018-08-03 DIAGNOSIS — E859 Amyloidosis, unspecified: Secondary | ICD-10-CM | POA: Diagnosis not present

## 2018-08-03 DIAGNOSIS — Z79890 Hormone replacement therapy (postmenopausal): Secondary | ICD-10-CM | POA: Diagnosis not present

## 2018-08-03 DIAGNOSIS — I130 Hypertensive heart and chronic kidney disease with heart failure and stage 1 through stage 4 chronic kidney disease, or unspecified chronic kidney disease: Secondary | ICD-10-CM | POA: Insufficient documentation

## 2018-08-03 DIAGNOSIS — Z79899 Other long term (current) drug therapy: Secondary | ICD-10-CM | POA: Diagnosis not present

## 2018-08-03 DIAGNOSIS — G4733 Obstructive sleep apnea (adult) (pediatric): Secondary | ICD-10-CM | POA: Insufficient documentation

## 2018-08-03 DIAGNOSIS — N183 Chronic kidney disease, stage 3 (moderate): Secondary | ICD-10-CM | POA: Insufficient documentation

## 2018-08-03 DIAGNOSIS — I350 Nonrheumatic aortic (valve) stenosis: Secondary | ICD-10-CM | POA: Diagnosis not present

## 2018-08-03 LAB — POCT INR: INR: 2.2 (ref 2.0–3.0)

## 2018-08-03 LAB — CBC
HEMATOCRIT: 34.8 % — AB (ref 36.0–46.0)
HEMOGLOBIN: 11 g/dL — AB (ref 12.0–15.0)
MCH: 35.4 pg — AB (ref 26.0–34.0)
MCHC: 31.6 g/dL (ref 30.0–36.0)
MCV: 111.9 fL — ABNORMAL HIGH (ref 80.0–100.0)
Platelets: 92 10*3/uL — ABNORMAL LOW (ref 150–400)
RBC: 3.11 MIL/uL — ABNORMAL LOW (ref 3.87–5.11)
RDW: 13.9 % (ref 11.5–15.5)
WBC: 3.8 10*3/uL — ABNORMAL LOW (ref 4.0–10.5)
nRBC: 0 % (ref 0.0–0.2)

## 2018-08-03 LAB — BASIC METABOLIC PANEL
ANION GAP: 7 (ref 5–15)
BUN: 21 mg/dL (ref 8–23)
CO2: 31 mmol/L (ref 22–32)
Calcium: 9.1 mg/dL (ref 8.9–10.3)
Chloride: 99 mmol/L (ref 98–111)
Creatinine, Ser: 1.27 mg/dL — ABNORMAL HIGH (ref 0.44–1.00)
GFR calc Af Amer: 45 mL/min — ABNORMAL LOW (ref >60)
GFR, EST NON AFRICAN AMERICAN: 39 mL/min — AB (ref >60)
GLUCOSE: 98 mg/dL (ref 70–99)
POTASSIUM: 3.8 mmol/L (ref 3.5–5.1)
SODIUM: 137 mmol/L (ref 135–145)

## 2018-08-03 MED ORDER — FUROSEMIDE 40 MG PO TABS: ORAL_TABLET | ORAL | 3 refills | Status: DC

## 2018-08-03 NOTE — Patient Instructions (Signed)
Description   Continue on same dosage 1 tablet daily except 1/2 tablet on Tuesdays, Thursdays and Saturdays. Recheck in 4 weeks. Call [phone] if scheduled for any procedure or any change in medications

## 2018-08-03 NOTE — Patient Instructions (Addendum)
CHANGE how you take Lasix. Take Lasix 80mg  (2 tabs) in the morning and 40mg  (1 tab) in the evening.   Labs today We will only contact you if something comes back abnormal or we need to make some changes. Otherwise no news is good news!  Your Physician requested that you have a scan of your heart.  You will be contacted to schedule your appointment.  Your physician recommends that you schedule a follow-up appointment in: 3 months

## 2018-08-03 NOTE — Progress Notes (Signed)
PCP: Dr. Darcus Austin Primary Cardiology: Dr. Radford Pax HF Cardiology: Dr. Aundra Dubin  84 y.o. with history of permanent atrial fibrillation and diastolic CHF presents for CHF clinic followup.  She has been followed by Dr. Radford Pax.  She has had atrial fibrillation since 2002, cardioversion was unsuccessful at that time.  She has done reasonably well over the years, but more recently developed progressive exercise intolerance.  She is limited by knee arthritis and low back pain, but clearly became more short of breath over the last 6 months. She is not very active.  When I saw her initially, she was short of breath walking up any stairs. She was short of breath dressing.  She was short of breath walking longer distances around her house.  No orthopnea/PND.  No chest pain.    I increased her Lasix. RHC in 4/18 showed elevated right and left heart filling pressures and pulmonary venous hypertension.  Weight dropped 20 lbs and I backed off on her Lasix to 40 qam/20 qpm alternating with 40 mg bid with rise in BUN/creatinine.    Echo 2/19 EF 55-60%, moderate LVH, PASP 65 mmHg, moderate AS with mean gradient 23 mmHg, severe biatrial enlargement.   She returns today for followup of CHF.  She is now taking Lasix 60 mg bid. Weight is up 3 lbs.  She walks with a cane.  Not very active but tries to do 20 laps around her house a day. Mild dyspnea at the end of her laps. No orthopnea/PND.  No chest pain.  She has trouble taking 60 mg bid Lasix as the 40 mg tabs are hard for her to split.   Labs (1/18): creatinine 1.06 Labs (4/18): K 3.9, creatinine 1.04 Labs (5/18): K 4.2, creatinine 1.18 Labs (9/18): K 4.2, creatinine 1.23 Labs (1/19): K 4.4, creatinine 1.30 Labs (2/19): K 4.3, creatinine 1.15 Labs (3/19): K 4.4, creatinine 1.22 Labs (4/19): K 4.4, creatinine 1.2 Labs (8/19): K 4.3, creatinine 1.19, LDL 63  PMH: 1. Atrial fibrillation: Permanent. Present since 2002.  2. H/o cholecystectomy  3. Hypothyroidism 4.  HTN 5. Hyperlipidemia 6. OSA: Cannot tolerate CPAP.  7. CKD stage III 8. Chronic diastolic CHF: Echo (1/61) with EF 50-55%, moderate to severe LVH, moderate AS (AVA 1.0 cm^2, mean gradient 21 mmHg), mild AI, mild RV dilation with moderately decreased RV systolic function, moderate to severe TR, PASP 69 mmHg, moderate mitral regurgitation.  - RHC (4/18): mean RA 14, PA 61/24 mean 41, mean PCWP 28, CI 3.48, PVR 1.8 WU.  - Echo (2/19): EF 55-60%, moderate LVH, mildly dilated RV with normal systolic function, PASP 65 mmHg, moderate AS with mean gradient 23 mmHg and AVA 1.2 cm^2, mild to moderate MR, severe biatrial enlargement, dilated IVC.  9. Aortic stenosis: Moderate on last echo in 2/19.   Social History   Socioeconomic History  . Marital status: Married    Spouse name: Not on file  . Number of children: 3  . Years of education: HS  . Highest education level: Not on file  Occupational History  . Not on file  Social Needs  . Financial resource strain: Not on file  . Food insecurity:    Worry: Not on file    Inability: Not on file  . Transportation needs:    Medical: Not on file    Non-medical: Not on file  Tobacco Use  . Smoking status: Never Smoker  . Smokeless tobacco: Never Used  Substance and Sexual Activity  . Alcohol use: No  .  Drug use: No  . Sexual activity: Not on file  Lifestyle  . Physical activity:    Days per week: Not on file    Minutes per session: Not on file  . Stress: Not on file  Relationships  . Social connections:    Talks on phone: Not on file    Gets together: Not on file    Attends religious service: Not on file    Active member of club or organization: Not on file    Attends meetings of clubs or organizations: Not on file    Relationship status: Not on file  . Intimate partner violence:    Fear of current or ex partner: Not on file    Emotionally abused: Not on file    Physically abused: Not on file    Forced sexual activity: Not on file   Other Topics Concern  . Not on file  Social History Narrative   Lives at home with two daughters and her friend.   Left-handed.   No caffeine use.   Family History  Problem Relation Age of Onset  . Heart disease Mother   . Heart disease Father   . Heart disease Sister   . Diabetes Sister    ROS: All systems reviewed and negative except as per HPI.  Current Outpatient Medications  Medication Sig Dispense Refill  . acetaminophen (TYLENOL) 500 MG tablet Take 1,000-2,000 mg by mouth 2 (two) times daily as needed for moderate pain or headache.     . alendronate (FOSAMAX) 70 MG tablet Take 70 mg by mouth every Wednesday.   11  . Calcium Carb-Cholecalciferol (CALCIUM + D3) 600-200 MG-UNIT TABS Take 1 tablet by mouth in the morning and 2 tablets at night    . dextromethorphan (DELSYM) 30 MG/5ML liquid Take 30 mg by mouth as needed for cough.    . furosemide (LASIX) 40 MG tablet Take 2 tablets (80 mg total) by mouth every morning AND 1 tablet (40 mg total) every evening. 270 tablet 3  . gabapentin (NEURONTIN) 100 MG capsule Take 1 capsule by mouth 3 (three) times daily.    Marland Kitchen glucosamine-chondroitin 500-400 MG tablet Take 2 tablets in the morning and 1 tablet at night    . levothyroxine (SYNTHROID, LEVOTHROID) 137 MCG tablet Take 137 mcg by mouth daily before breakfast.    . loratadine (CLARITIN) 10 MG tablet Take 10 mg by mouth daily as needed for allergies.    Marland Kitchen lovastatin (MEVACOR) 40 MG tablet Take 40 mg by mouth at bedtime.    . metoprolol succinate (TOPROL-XL) 25 MG 24 hr tablet Take 25 mg by mouth daily.    . Multiple Vitamin (MULTIVITAMIN) tablet Take 1 tablet by mouth daily.    . Multiple Vitamins-Minerals (EYE VITAMINS PO) Take 1 capsule by mouth 2 (two) times daily.     . Polyvinyl Alcohol-Povidone (REFRESH OP) Apply 1 drop to eye daily.    . ramipril (ALTACE) 2.5 MG capsule Take 1 capsule (2.5 mg total) by mouth daily. 90 capsule 3  . warfarin (COUMADIN) 5 MG tablet TAKE 1 TABLET  DAILY OR AS DIRECTED BY COUMADIN CLINIC 90 tablet 1   No current facility-administered medications for this encounter.    BP (!) 144/76   Pulse 78   Wt 97.2 kg (214 lb 3.2 oz)   SpO2 93%   BMI 36.77 kg/m   Filed Weights   08/03/18 1025  Weight: 97.2 kg (214 lb 3.2 oz)   General: NAD Neck:  JVP not elevated, no thyromegaly or thyroid nodule.  Lungs: Clear to auscultation bilaterally with normal respiratory effort. CV: Nondisplaced PMI.  Heart irregular S1/S2, no M3/V6, 3/6 systolic murmur RUSB with muffled S2.  Trace ankle edema.  No carotid bruit.  Normal pedal pulses.  Abdomen: Soft, nontender, no hepatosplenomegaly, no distention.  Skin: Intact without lesions or rashes.  Neurologic: Alert and oriented x 3.  Psych: Normal affect. Extremities: No clubbing or cyanosis.  HEENT: Normal.   Assessment/Plan: 1. Atrial fibrillation: Permanent. She is on warfarin with no problems. Rate controlled with Toprol XL. - Continue warfarin. CBC today.  2. Chronic diastolic CHF: With prominent RV failure. [place] in 4/18 showed pulmonary venous hypertension with elevated left and right heart filling pressures.  NYHA class III symptoms.  She does not appear volume overloaded today.  - Change Lasix to 80 qam/40 qpm for ease of use (so she will not have to split a tablet). BMET today.  - With LVH, atrial fibrillation, AS, and diastolic CHF, transthyretin cardiac amyloidosis is a strong consideration.  I am going to get a PYP scan to look for TTR amyloidosis.   3. Aortic stenosis: moderate AS by Echo 10/2017.  Soft S2 is concerning for possible severe AS.   - I will arrange for repeat echo at 1 year in 2/20.  4. Pulmonary hypertension: She has group 2 PH due to LV diastolic dysfunction/elevated LA pressure (pulmonary venous hypertension).   - Continue lasix as above.   Followup in 3 months.   Loralie Champagne, MD 08/03/2018

## 2018-08-16 ENCOUNTER — Encounter (HOSPITAL_COMMUNITY)
Admission: RE | Admit: 2018-08-16 | Discharge: 2018-08-16 | Disposition: A | Discharge status: Home / Self Care | Payer: Medicare HMO | Source: Ambulatory Visit | Attending: Cardiology | Admitting: Cardiology

## 2018-08-16 DIAGNOSIS — E859 Amyloidosis, unspecified: Secondary | ICD-10-CM

## 2018-08-16 DIAGNOSIS — I509 Heart failure, unspecified: Secondary | ICD-10-CM | POA: Diagnosis not present

## 2018-08-16 MED ORDER — TECHNETIUM TC 99M PYROPHOSPHATE
21.2000 | Freq: Once | INTRAVENOUS | Status: AC
  Administered 2018-08-16: 21.2 via INTRAVENOUS
  Filled 2018-08-16: qty 22

## 2018-08-26 ENCOUNTER — Telehealth (HOSPITAL_COMMUNITY): Payer: Self-pay

## 2018-08-26 NOTE — Telephone Encounter (Signed)
-----   Message from Larey Dresser, MD sent at 08/16/2018  5:22 PM EST ----- Equivocal for TTR amyloidosis, probably negative.

## 2018-08-26 NOTE — Telephone Encounter (Signed)
Result Notes for NM Tumor Localization W Spect   Notes recorded by Valeda Malm, RN on 08/26/2018 at 4:24 PM EST Pt called back, received results of her scan ------  Notes recorded by Valeda Malm, RN on 08/26/2018 at 4:21 PM EST LM for patient to call office for results of PYP scan

## 2018-09-03 ENCOUNTER — Ambulatory Visit (INDEPENDENT_AMBULATORY_CARE_PROVIDER_SITE_OTHER): Payer: Medicare HMO | Admitting: *Deleted

## 2018-09-03 ENCOUNTER — Encounter (INDEPENDENT_AMBULATORY_CARE_PROVIDER_SITE_OTHER): Payer: Self-pay

## 2018-09-03 DIAGNOSIS — I4821 Permanent atrial fibrillation: Secondary | ICD-10-CM

## 2018-09-03 DIAGNOSIS — Z5181 Encounter for therapeutic drug level monitoring: Secondary | ICD-10-CM

## 2018-09-03 LAB — POCT INR: INR: 2.3 (ref 2.0–3.0)

## 2018-09-03 NOTE — Patient Instructions (Signed)
Description   Continue on same dosage 1 tablet daily except 1/2 tablet on Tuesdays, Thursdays and Saturdays. Recheck in 5 weeks. Call [phone] if scheduled for any procedure or any change in medications

## 2018-09-08 ENCOUNTER — Other Ambulatory Visit: Payer: Self-pay | Admitting: Cardiology

## 2018-09-17 ENCOUNTER — Other Ambulatory Visit: Payer: Self-pay | Admitting: Cardiology

## 2018-10-08 ENCOUNTER — Ambulatory Visit (INDEPENDENT_AMBULATORY_CARE_PROVIDER_SITE_OTHER): Payer: Medicare HMO | Admitting: Pharmacist

## 2018-10-08 ENCOUNTER — Encounter: Payer: Self-pay | Admitting: Cardiology

## 2018-10-08 DIAGNOSIS — Z5181 Encounter for therapeutic drug level monitoring: Secondary | ICD-10-CM

## 2018-10-08 DIAGNOSIS — I4821 Permanent atrial fibrillation: Secondary | ICD-10-CM | POA: Diagnosis not present

## 2018-10-08 LAB — POCT INR: INR: 2.1 (ref 2.0–3.0)

## 2018-10-08 NOTE — Patient Instructions (Signed)
Continue on same dosage 1 tablet daily except 1/2 tablet on Tuesdays, Thursdays and Saturdays. Recheck in 5 weeks. Call [phone] if scheduled for any procedure or any change in medications

## 2018-10-13 ENCOUNTER — Ambulatory Visit (HOSPITAL_COMMUNITY): Payer: Medicare HMO | Attending: Cardiology

## 2018-10-13 DIAGNOSIS — I4821 Permanent atrial fibrillation: Secondary | ICD-10-CM

## 2018-10-13 DIAGNOSIS — I5032 Chronic diastolic (congestive) heart failure: Secondary | ICD-10-CM | POA: Diagnosis not present

## 2018-10-21 DIAGNOSIS — M5136 Other intervertebral disc degeneration, lumbar region: Secondary | ICD-10-CM | POA: Diagnosis not present

## 2018-10-21 DIAGNOSIS — D7589 Other specified diseases of blood and blood-forming organs: Secondary | ICD-10-CM | POA: Diagnosis not present

## 2018-10-21 DIAGNOSIS — N183 Chronic kidney disease, stage 3 (moderate): Secondary | ICD-10-CM | POA: Diagnosis not present

## 2018-10-21 DIAGNOSIS — E039 Hypothyroidism, unspecified: Secondary | ICD-10-CM | POA: Diagnosis not present

## 2018-10-27 ENCOUNTER — Encounter: Payer: Self-pay | Admitting: Cardiology

## 2018-10-27 ENCOUNTER — Ambulatory Visit: Payer: Medicare HMO | Admitting: Cardiology

## 2018-10-27 VITALS — BP 120/64 | HR 74 | Ht 64.0 in | Wt 214.0 lb

## 2018-10-27 DIAGNOSIS — I5032 Chronic diastolic (congestive) heart failure: Secondary | ICD-10-CM | POA: Diagnosis not present

## 2018-10-27 DIAGNOSIS — I10 Essential (primary) hypertension: Secondary | ICD-10-CM | POA: Diagnosis not present

## 2018-10-27 DIAGNOSIS — I4821 Permanent atrial fibrillation: Secondary | ICD-10-CM

## 2018-10-27 DIAGNOSIS — I272 Pulmonary hypertension, unspecified: Secondary | ICD-10-CM | POA: Diagnosis not present

## 2018-10-27 DIAGNOSIS — I350 Nonrheumatic aortic (valve) stenosis: Secondary | ICD-10-CM | POA: Diagnosis not present

## 2018-10-27 NOTE — Progress Notes (Signed)
Cardiology Office Note:    Date:  10/28/2018   ID:  Sandra Merritt, DOB 15-Jan-1932, MRN [id number]  PCP:  Jonathon Jordan, MD  Cardiologist:  Fransico Him, MD    Referring MD: Darcus Austin, MD   Chief Complaint  Patient presents with  . Atrial Fibrillation  . Hypertension  . Mitral Regurgitation    History of Present Illness:    Sandra Merritt is a 85 y.o. female with a hx of  permanentatrial fibrillation on systemic anticoagulation, moderate MR, mild AS, moderate pulmonary HTNand HTN. She was evaluated by Dr. Aundra Dubin in AHF clinic for pulmonary HTN and DOE. RHC showed elevated right and Left heart filling pressures c/w pulmonary venous HTN and diuretics were adjusted. Her last echo showed normal LVF with severe LVH, moderate AS, midl MR with moderate MR, and right heart failure with moderate to severe pulmonary HTN.   She is here today and doing fairly well.  She continues to walk using a walker.  She does have dyspnea on exertion which appears stable.  She occasionally has lower extremity edema but sits around with her feet dependent a lot of the time.  She has compression hose but has not really been using them.  Her weight fluctuates some but usually remains around 215 pounds.  She denies any chest pain or pressure, PND, orthopnea, dizziness or syncope.  Not noticed her heart racing.  Past Medical History:  Diagnosis Date  . Aortic stenosis    mild by echo 2017  . CKD (chronic kidney disease), stage III ([place])   . Colon polyps   . COPD (chronic obstructive pulmonary disease) ([place])   . Dyslipidemia   . Hypertension   . Hypothyroid   . LFTs abnormal    History of abnormal LFTs due to Fatty Liver  . Macular degeneration   . Memory loss   . Mitral regurgitation    mdoerate by echo 07/2015  . Obesity   . OSA (obstructive sleep apnea)    intolerant to CPAP  . Osteoarthritis   . Osteopenia   . Permanent atrial fibrillation    Chronic on coumadin  . Pulmonary HTN ([place])    moderate with PASP 5mmHg on echo 07/2015  . Right sided facial pain   . Vitamin D deficiency     Past Surgical History:  Procedure Laterality Date  . BREAST BIOPSY    . CHOLECYSTECTOMY    . KNEE SURGERY Right   . RIGHT HEART CATH N/A 12/15/2016   Procedure: Right Heart Cath;  Surgeon: Larey Dresser, MD;  Location: [facility];  Service: Cardiovascular;  Laterality: N/A;    Current Medications: Current Meds  Medication Sig  . acetaminophen (TYLENOL) 500 MG tablet Take 1,000-2,000 mg by mouth 2 (two) times daily as needed for moderate pain or headache.   . alendronate (FOSAMAX) 70 MG tablet Take 70 mg by mouth every Wednesday.   . Calcium Carb-Cholecalciferol (CALCIUM + D3) 600-200 MG-UNIT TABS Take 1 tablet by mouth in the morning and 2 tablets at night  . dextromethorphan (DELSYM) 30 MG/5ML liquid Take 30 mg by mouth as needed for cough.  . furosemide (LASIX) 40 MG tablet Take 2 tablets (80 mg total) by mouth every morning AND 1 tablet (40 mg total) every evening.  . gabapentin (NEURONTIN) 100 MG capsule Take 1 capsule by mouth 3 (three) times daily.  . Glucosamine HCl (GLUCOSAMINE MAXIMUM STRENGTH PO) Take by mouth daily.  Marland Kitchen levothyroxine (SYNTHROID, LEVOTHROID) 137 MCG  tablet Take 137 mcg by mouth daily before breakfast.  . loratadine (CLARITIN) 10 MG tablet Take 10 mg by mouth daily as needed for allergies.  Marland Kitchen lovastatin (MEVACOR) 40 MG tablet Take 40 mg by mouth at bedtime.  . metoprolol succinate (TOPROL-XL) 25 MG 24 hr tablet Take 25 mg by mouth daily.  . Multiple Vitamin (MULTIVITAMIN) tablet Take 1 tablet by mouth daily.  . Multiple Vitamins-Minerals (EYE VITAMINS PO) Take 1 capsule by mouth 2 (two) times daily.   . Polyvinyl Alcohol-Povidone (REFRESH OP) Apply 1 drop to eye daily.  . ramipril (ALTACE) 2.5 MG capsule Take 1 capsule (2.5 mg total) by mouth daily.  Marland Kitchen warfarin (COUMADIN) 5 MG tablet TAKE 1 TABLET DAILY OR AS DIRECTED BY COUMADIN CLINIC     Allergies:    Milk-related compounds and Penicillins   Social History   Socioeconomic History  . Marital status: Married    Spouse name: Not on file  . Number of children: 3  . Years of education: HS  . Highest education level: Not on file  Occupational History  . Not on file  Social Needs  . Financial resource strain: Not on file  . Food insecurity:    Worry: Not on file    Inability: Not on file  . Transportation needs:    Medical: Not on file    Non-medical: Not on file  Tobacco Use  . Smoking status: Never Smoker  . Smokeless tobacco: Never Used  Substance and Sexual Activity  . Alcohol use: No  . Drug use: No  . Sexual activity: Not on file  Lifestyle  . Physical activity:    Days per week: Not on file    Minutes per session: Not on file  . Stress: Not on file  Relationships  . Social connections:    Talks on phone: Not on file    Gets together: Not on file    Attends religious service: Not on file    Active member of club or organization: Not on file    Attends meetings of clubs or organizations: Not on file    Relationship status: Not on file  Other Topics Concern  . Not on file  Social History Narrative   Lives at home with two daughters and her friend.   Left-handed.   No caffeine use.     Family History: The patient's family history includes Diabetes in her sister; Heart disease in her father, mother, and sister.  ROS:   Please see the history of present illness.    ROS  All other systems reviewed and negative.   EKGs/Labs/Other Studies Reviewed:    The following studies were reviewed today: none  EKG:  EKG is ordered today and showed atrial fibrillation at 74 bpm with occasional PVC and no ST changes.  Recent Labs: 08/03/2018: BUN 21; Creatinine, Ser 1.27; Hemoglobin 11.0; Platelets 92; Potassium 3.8; Sodium 137   Recent Lipid Panel No results found for: CHOL, TRIG, HDL, CHOLHDL, VLDL, LDLCALC, LDLDIRECT  Physical Exam:    VS:  BP 120/64   Pulse 74    Ht 5\' 4"  (1.626 m)   Wt 214 lb (97.1 kg)   BMI 36.73 kg/m     Wt Readings from Last 3 Encounters:  10/27/18 214 lb (97.1 kg)  08/03/18 214 lb 3.2 oz (97.2 kg)  04/30/18 211 lb (95.7 kg)     GEN:  Well nourished, well developed in no acute distress HEENT: Normal NECK: No JVD; No carotid  bruits LYMPHATICS: No lymphadenopathy CARDIAC: RRR, no murmurs, rubs, gallops RESPIRATORY:  Clear to auscultation without rales, wheezing or rhonchi  ABDOMEN: Soft, non-tender, non-distended MUSCULOSKELETAL:  No edema; No deformity  SKIN: Warm and dry NEUROLOGIC:  Alert and oriented x 3 PSYCHIATRIC:  Normal affect   ASSESSMENT:    1. Permanent atrial fibrillation   2. Essential hypertension   3. Pulmonary HTN ([place])   4. Nonrheumatic aortic valve stenosis   5. Chronic diastolic heart failure (HCC)    PLAN:    In order of problems listed above:  1.  Permanent atrial fibrillation -heart rate is well controlled on exam today.  She will continue on Toprol-XL 25 mg daily and warfarin.  Hemoglobin was stable at 11.2 on 10/21/2018.  2.  HTN -BP is controlled on exam today.  She will continue on Toprol-XL 25 mg daily and Altace 2.5 mg daily.  3.  Pulmonary HTN -moderate by recent echo.  PASP 55 mmHg.  This is improved compared to echo a year ago when PASP was 65 mmHg.  This is WHO group 2 secondary to pulmonary venous hypertension due to elevated left and right filling pressures.  Continue diuretics  4.  Aortic stenosis -  echo a few weeks ago showed mild AI and moderate aortic stenosis with mean aortic valve gradient 25 mmHg but dimensionless index is 0.27 so likely more on the moderate to severe side. We will repeat echo in a year to assess for stability.  5.  Chronic diastolic CHF - She has NYHA class III symptoms but she does not appear volume overloaded on exam today. Due to LVH with A. fib and diastolic CHF PYP scan was done which was equivocal for TTR amyloid and per Dr. Aundra Dubin likely  negative.   She will continue on Lasix 80 mg every morning and 40 mg every afternoon.  Creatinine was stable at 1.14 and potassium 4.3 on 10/21/2018.  She has an appointment with Dr. Aundra Dubin in March.   Medication Adjustments/Labs and Tests Ordered: Current medicines are reviewed at length with the patient today.  Concerns regarding medicines are outlined above.  Orders Placed This Encounter  Procedures  . EKG 12-Lead   No orders of the defined types were placed in this encounter.   Signed, Fransico Him, MD  10/28/2018 11:54 AM    [place]

## 2018-10-27 NOTE — Patient Instructions (Signed)
Medication Instructions:  Your physician recommends that you continue on your current medications as directed. Please refer to the Current Medication list given to you today.  If you need a refill on your cardiac medications before your next appointment, please call your pharmacy.   Lab work: None If you have labs (blood work) drawn today and your tests are completely normal, you will receive your results only by: . MyChart Message (if you have MyChart) OR . A paper copy in the mail If you have any lab test that is abnormal or we need to change your treatment, we will call you to review the results.  Testing/Procedures: None  Follow-Up: At [facility], you and your health needs are our priority.  As part of our continuing mission to provide you with exceptional heart care, we have created designated Provider Care Teams.  These Care Teams include your primary Cardiologist (physician) and Advanced Practice Providers (APPs -  Physician Assistants and Nurse Practitioners) who all work together to provide you with the care you need, when you need it. You will need a follow up appointment in 6 months.  Please call our office 2 months in advance to schedule this appointment.  You may see Traci Turner, MD or one of the following Advanced Practice Providers on your designated Care Team:   Brittainy Simmons, PA-C Dayna Dunn, PA-C . Michele Lenze, PA-C     

## 2018-10-29 ENCOUNTER — Telehealth: Payer: Self-pay

## 2018-10-29 DIAGNOSIS — I5032 Chronic diastolic (congestive) heart failure: Secondary | ICD-10-CM

## 2018-10-29 DIAGNOSIS — I350 Nonrheumatic aortic (valve) stenosis: Secondary | ICD-10-CM

## 2018-10-29 DIAGNOSIS — I071 Rheumatic tricuspid insufficiency: Secondary | ICD-10-CM

## 2018-10-29 NOTE — Telephone Encounter (Signed)
Spoke with the patient, she expressed understanding about having a repeat echo in 6 months. She had no further questions.

## 2018-10-29 NOTE — Telephone Encounter (Signed)
-----   Message from Sueanne Margarita, MD sent at 10/29/2018  9:30 AM EST ----- I thought it was probably worse as well.  I will have my nurse order  another 2D echo in 6 months.  Traci ----- Message ----- From: Sherren Mocha, MD Sent: 10/28/2018  10:08 PM EST To: Larey Dresser, MD, Sueanne Margarita, MD  I just reviewed her echo and looked at your note from yesterday.  Looks like she sees Cabin crew next month.  I think she probably has severe aortic stenosis.  Her valve looks really bad on the 2D images.  Hemodynamics bordering on moderate to severe.  I suspect it is a matter of time before she needs TAVR especially in the setting of heart failure. If stable could do a 6-12 month echo or refer sooner, whatever you guys think is best. ----- Message ----- From: Sueanne Margarita, MD Sent: 10/14/2018  11:10 PM EST To: Larey Dresser, MD, Sherren Mocha, MD  Kirk Ruths and Ronalee Belts,  Can you take a look at this echo.  [place] you see her in AHF clinic with decreased exercise tolerance, SOB and had restrictive physiology on echo.  Mark read her echo as moderate but with a small stiff ventricle and AVA < 0.8cm2 and dimensionless index low I am concerned that this is actually low flow low gradient preserved EF severe AS.  Any thoughts

## 2018-11-02 ENCOUNTER — Ambulatory Visit (HOSPITAL_COMMUNITY)
Admission: RE | Admit: 2018-11-02 | Discharge: 2018-11-02 | Disposition: A | Discharge status: Home / Self Care | Payer: Medicare HMO | Source: Ambulatory Visit | Attending: Cardiology | Admitting: Cardiology

## 2018-11-02 ENCOUNTER — Encounter (HOSPITAL_COMMUNITY): Payer: Self-pay | Admitting: *Deleted

## 2018-11-02 ENCOUNTER — Encounter (HOSPITAL_COMMUNITY): Payer: Self-pay | Admitting: Cardiology

## 2018-11-02 ENCOUNTER — Telehealth: Payer: Self-pay | Admitting: Pharmacist

## 2018-11-02 VITALS — BP 140/68 | HR 92 | Wt 210.6 lb

## 2018-11-02 DIAGNOSIS — E785 Hyperlipidemia, unspecified: Secondary | ICD-10-CM | POA: Insufficient documentation

## 2018-11-02 DIAGNOSIS — Z79890 Hormone replacement therapy (postmenopausal): Secondary | ICD-10-CM | POA: Insufficient documentation

## 2018-11-02 DIAGNOSIS — N183 Chronic kidney disease, stage 3 (moderate): Secondary | ICD-10-CM | POA: Insufficient documentation

## 2018-11-02 DIAGNOSIS — G4733 Obstructive sleep apnea (adult) (pediatric): Secondary | ICD-10-CM | POA: Diagnosis not present

## 2018-11-02 DIAGNOSIS — I071 Rheumatic tricuspid insufficiency: Secondary | ICD-10-CM | POA: Diagnosis not present

## 2018-11-02 DIAGNOSIS — Z8249 Family history of ischemic heart disease and other diseases of the circulatory system: Secondary | ICD-10-CM | POA: Diagnosis not present

## 2018-11-02 DIAGNOSIS — E039 Hypothyroidism, unspecified: Secondary | ICD-10-CM | POA: Diagnosis not present

## 2018-11-02 DIAGNOSIS — I5032 Chronic diastolic (congestive) heart failure: Secondary | ICD-10-CM | POA: Insufficient documentation

## 2018-11-02 DIAGNOSIS — I272 Pulmonary hypertension, unspecified: Secondary | ICD-10-CM | POA: Insufficient documentation

## 2018-11-02 DIAGNOSIS — Z833 Family history of diabetes mellitus: Secondary | ICD-10-CM | POA: Diagnosis not present

## 2018-11-02 DIAGNOSIS — Z79899 Other long term (current) drug therapy: Secondary | ICD-10-CM | POA: Insufficient documentation

## 2018-11-02 DIAGNOSIS — I4821 Permanent atrial fibrillation: Secondary | ICD-10-CM | POA: Diagnosis not present

## 2018-11-02 DIAGNOSIS — Z9049 Acquired absence of other specified parts of digestive tract: Secondary | ICD-10-CM | POA: Insufficient documentation

## 2018-11-02 DIAGNOSIS — I350 Nonrheumatic aortic (valve) stenosis: Secondary | ICD-10-CM

## 2018-11-02 DIAGNOSIS — I130 Hypertensive heart and chronic kidney disease with heart failure and stage 1 through stage 4 chronic kidney disease, or unspecified chronic kidney disease: Secondary | ICD-10-CM | POA: Insufficient documentation

## 2018-11-02 DIAGNOSIS — Z7901 Long term (current) use of anticoagulants: Secondary | ICD-10-CM | POA: Diagnosis not present

## 2018-11-02 LAB — BASIC METABOLIC PANEL
Anion gap: 9 (ref 5–15)
BUN: 24 mg/dL — ABNORMAL HIGH (ref 8–23)
CO2: 29 mmol/L (ref 22–32)
Calcium: 9.3 mg/dL (ref 8.9–10.3)
Chloride: 103 mmol/L (ref 98–111)
Creatinine, Ser: 1.17 mg/dL — ABNORMAL HIGH (ref 0.44–1.00)
GFR calc Af Amer: 50 mL/min — ABNORMAL LOW (ref >60)
GFR calc non Af Amer: 43 mL/min — ABNORMAL LOW (ref >60)
Glucose, Bld: 93 mg/dL (ref 70–99)
Potassium: 3.9 mmol/L (ref 3.5–5.1)
Sodium: 141 mmol/L (ref 135–145)

## 2018-11-02 LAB — PROTIME-INR
INR: 2.1 — ABNORMAL HIGH (ref 0.8–1.2)
Prothrombin Time: 23 seconds — ABNORMAL HIGH (ref 11.4–15.2)

## 2018-11-02 LAB — CBC
HCT: 34.4 % — ABNORMAL LOW (ref 36.0–46.0)
Hemoglobin: 10.7 g/dL — ABNORMAL LOW (ref 12.0–15.0)
MCH: 34.3 pg — AB (ref 26.0–34.0)
MCHC: 31.1 g/dL (ref 30.0–36.0)
MCV: 110.3 fL — ABNORMAL HIGH (ref 80.0–100.0)
Platelets: 90 10*3/uL — ABNORMAL LOW (ref 150–400)
RBC: 3.12 MIL/uL — ABNORMAL LOW (ref 3.87–5.11)
RDW: 15.1 % (ref 11.5–15.5)
WBC: 4 10*3/uL (ref 4.0–10.5)
nRBC: 0 % (ref 0.0–0.2)

## 2018-11-02 NOTE — H&P (View-Only) (Signed)
PCP: Dr. Darcus Austin Primary Cardiology: Dr. Radford Pax HF Cardiology: Dr. Aundra Dubin  85 y.o. with history of permanent atrial fibrillation and diastolic CHF presents for CHF clinic followup.  She has been followed by Dr. Radford Pax.  She has had atrial fibrillation since 2002, cardioversion was unsuccessful at that time.  She has done reasonably well over the years, but more recently developed progressive exercise intolerance.  She is limited by knee arthritis and low back pain, but clearly became more short of breath over the last 6 months. She is not very active.  When I saw her initially, she was short of breath walking up any stairs. She was short of breath dressing.  She was short of breath walking longer distances around her house.  No orthopnea/PND.  No chest pain.    I increased her Lasix. RHC in 4/18 showed elevated right and left heart filling pressures and pulmonary venous hypertension.  Weight dropped 20 lbs and I backed off on her Lasix to 40 qam/20 qpm alternating with 40 mg bid with rise in BUN/creatinine.    Echo 2/19 EF 55-60%, moderate LVH, PASP 65 mmHg, moderate AS with mean gradient 23 mmHg, severe biatrial enlargement.   Echo was done in 2/20 showing EF 60-65%, moderate LVH, normal RV, severe TR, moderate MR, suspected paradoxical low flow/low gradient aortic stenosis with mean gradient 25 mmHg/AVA 0.83 cm^2, mild AI.   She returns today for followup of CHF.  Weight is down 4 lbs. She is short of breath generally in the morning, gets better through the day. She uses a walker and tries to walk 10 laps around her house daily for exercise. Long distances (such as walking into the office today) get her short of breath.  She is also limited by back pain.  No orthopnea/PND. No chest pain.   Labs (1/18): creatinine 1.06 Labs (4/18): K 3.9, creatinine 1.04 Labs (5/18): K 4.2, creatinine 1.18 Labs (9/18): K 4.2, creatinine 1.23 Labs (1/19): K 4.4, creatinine 1.30 Labs (2/19): K 4.3, creatinine  1.15 Labs (3/19): K 4.4, creatinine 1.22 Labs (4/19): K 4.4, creatinine 1.2 Labs (8/19): K 4.3, creatinine 1.19, LDL 63 Labs (12/19): K 3.8, creatinine 1.27  PMH: 1. Atrial fibrillation: Permanent. Present since 2002.  2. H/o cholecystectomy  3. Hypothyroidism 4. HTN 5. Hyperlipidemia 6. OSA: Cannot tolerate CPAP.  7. CKD stage III 8. Chronic diastolic CHF: Echo (5/62) with EF 50-55%, moderate to severe LVH, moderate AS (AVA 1.0 cm^2, mean gradient 21 mmHg), mild AI, mild RV dilation with moderately decreased RV systolic function, moderate to severe TR, PASP 69 mmHg, moderate mitral regurgitation. PYP scan (12/19) with H:CL 1.46, grade 1 visual => unlikely to be TTR amyloidosis.  - RHC (4/18): mean RA 14, PA 61/24 mean 41, mean PCWP 28, CI 3.48, PVR 1.8 WU.  - Echo (2/19): EF 55-60%, moderate LVH, mildly dilated RV with normal systolic function, PASP 65 mmHg, moderate AS with mean gradient 23 mmHg and AVA 1.2 cm^2, mild to moderate MR, severe biatrial enlargement, dilated IVC.  - Echo (2/20): EF 60-65%, moderate LVH, normal RV, severe TR, moderate MR, suspected paradoxical low flow/low gradient aortic stenosis with mean gradient 25 mmHg/AVA 0.83 cm^2, mild AI.  9. Aortic stenosis: Echo in 2/20 concerning for paradoxical low flow/low gradient severe aortic stenosis.   Social History   Socioeconomic History  . Marital status: Married    Spouse name: Not on file  . Number of children: 3  . Years of education: HS  .  Highest education level: Not on file  Occupational History  . Not on file  Social Needs  . Financial resource strain: Not on file  . Food insecurity:    Worry: Not on file    Inability: Not on file  . Transportation needs:    Medical: Not on file    Non-medical: Not on file  Tobacco Use  . Smoking status: Never Smoker  . Smokeless tobacco: Never Used  Substance and Sexual Activity  . Alcohol use: No  . Drug use: No  . Sexual activity: Not on file  Lifestyle  .  Physical activity:    Days per week: Not on file    Minutes per session: Not on file  . Stress: Not on file  Relationships  . Social connections:    Talks on phone: Not on file    Gets together: Not on file    Attends religious service: Not on file    Active member of club or organization: Not on file    Attends meetings of clubs or organizations: Not on file    Relationship status: Not on file  . Intimate partner violence:    Fear of current or ex partner: Not on file    Emotionally abused: Not on file    Physically abused: Not on file    Forced sexual activity: Not on file  Other Topics Concern  . Not on file  Social History Narrative   Lives at home with two daughters and her friend.   Left-handed.   No caffeine use.   Family History  Problem Relation Age of Onset  . Heart disease Mother   . Heart disease Father   . Heart disease Sister   . Diabetes Sister    ROS: All systems reviewed and negative except as per HPI.  Current Outpatient Medications  Medication Sig Dispense Refill  . acetaminophen (TYLENOL) 500 MG tablet Take 1,000-2,000 mg by mouth 2 (two) times daily as needed for moderate pain or headache.     . alendronate (FOSAMAX) 70 MG tablet Take 70 mg by mouth every Wednesday.   11  . Calcium Carb-Cholecalciferol (CALCIUM + D3) 600-200 MG-UNIT TABS Take 1 tablet by mouth in the morning and 2 tablets at night    . dextromethorphan (DELSYM) 30 MG/5ML liquid Take 30 mg by mouth as needed for cough.    . furosemide (LASIX) 40 MG tablet Take 2 tablets (80 mg total) by mouth every morning AND 1 tablet (40 mg total) every evening. 270 tablet 3  . gabapentin (NEURONTIN) 100 MG capsule Take 1 capsule by mouth 3 (three) times daily.    . Glucosamine HCl (GLUCOSAMINE MAXIMUM STRENGTH PO) Take by mouth daily.    Marland Kitchen levothyroxine (SYNTHROID, LEVOTHROID) 137 MCG tablet Take 137 mcg by mouth daily before breakfast.    . loratadine (CLARITIN) 10 MG tablet Take 10 mg by mouth daily  as needed for allergies.    Marland Kitchen lovastatin (MEVACOR) 40 MG tablet Take 40 mg by mouth at bedtime.    . metoprolol succinate (TOPROL-XL) 25 MG 24 hr tablet Take 25 mg by mouth daily.    . Multiple Vitamin (MULTIVITAMIN) tablet Take 1 tablet by mouth daily.    . Multiple Vitamins-Minerals (EYE VITAMINS PO) Take 1 capsule by mouth 2 (two) times daily.     . Polyvinyl Alcohol-Povidone (REFRESH OP) Apply 1 drop to eye daily.    . ramipril (ALTACE) 2.5 MG capsule Take 1 capsule (2.5 mg total) by  mouth daily. 90 capsule 1  . warfarin (COUMADIN) 5 MG tablet TAKE 1 TABLET DAILY OR AS DIRECTED BY COUMADIN CLINIC 90 tablet 1   No current facility-administered medications for this encounter.    BP 140/68   Pulse 92   Wt 95.5 kg (210 lb 9.6 oz)   SpO2 93%   BMI 36.15 kg/m   Filed Weights   11/02/18 0954  Weight: 95.5 kg (210 lb 9.6 oz)   General: NAD Neck: JVP 8 cm, no thyromegaly or thyroid nodule.  Lungs: Clear to auscultation bilaterally with normal respiratory effort. CV: Nondisplaced PMI.  Heart irregular S1/S2, no S3/S4, 3/6 SEM RUSB with some obscuration of S2.  1+ ankle edema.  No carotid bruit.  Normal pedal pulses.  Abdomen: Soft, nontender, no hepatosplenomegaly, no distention.  Skin: Intact without lesions or rashes.  Neurologic: Alert and oriented x 3.  Psych: Normal affect. Extremities: No clubbing or cyanosis.  HEENT: Normal.   Assessment/Plan: 1. Atrial fibrillation: Permanent. She is on warfarin with no problems. Rate controlled with Toprol XL. - Continue warfarin. CBC today.  2. Chronic diastolic CHF: With prominent RV failure. [place] in 4/18 showed pulmonary venous hypertension with elevated left and right heart filling pressures.  PYP scan in 12/19 was not suggestive of TTR amyloidosis.  NYHA class III symptoms, chronic. Mild volume overload on exam today.   - Continue current Lasix dose for now, will adjust based on results of RHC (see below).  3. Aortic stenosis: Soft S2 on  exam concerning for severe AS.  I reviewed her recent echo from 2/20.  I suspect that she has paradoxical low flow/low gradient severe AS.  NYHA class III symptoms, I would like to evaluate her for AVR.  - I will arrange for TEE to assess the valve more closely, confirm severe AS.  - I will plan for RHC/LHC pre-TAVR. She will need to hold warfarin prior to cath, will discuss with coumadin clinic whether she will need Lovenox bridge.  - We discussed risks/benefits of procedures and she agrees to them.  - I will refer her to structural heart clinic for TAVR evaluation (after TEE and caths).  I think she would be a reasonable candidate.   4. Pulmonary hypertension: She has group 2 PH due to LV diastolic dysfunction/elevated LA pressure (pulmonary venous hypertension).   - Continue lasix as above.  5. Tricuspid regurgitation: Severe on 2/20 echo.  Will reassess with TEE.   Followup 2 wks after cath.    Loralie Champagne, MD 11/02/2018

## 2018-11-02 NOTE — Telephone Encounter (Signed)
Received call from CHF clinic to help manage anticoagulation around patient's upcoming right and left heart cath that has been scheduled for 3/12.  Pt takes warfarin for afib with CHADS2VASc score of 5 (age x2, sex, CHF, HTN). Ok to hold warfarin 4 days prior to cath.   LMOM for pt - will need to let her know that she can hold warfarin for 4 days prior to procedure. Will also need to move INR appt back a week that is currently scheduled for 3/13.

## 2018-11-02 NOTE — Progress Notes (Signed)
PCP: Dr. Darcus Austin Primary Cardiology: Dr. Radford Pax HF Cardiology: Dr. Aundra Dubin  85 y.o. with history of permanent atrial fibrillation and diastolic CHF presents for CHF clinic followup.  She has been followed by Dr. Radford Pax.  She has had atrial fibrillation since 2002, cardioversion was unsuccessful at that time.  She has done reasonably well over the years, but more recently developed progressive exercise intolerance.  She is limited by knee arthritis and low back pain, but clearly became more short of breath over the last 6 months. She is not very active.  When I saw her initially, she was short of breath walking up any stairs. She was short of breath dressing.  She was short of breath walking longer distances around her house.  No orthopnea/PND.  No chest pain.    I increased her Lasix. RHC in 4/18 showed elevated right and left heart filling pressures and pulmonary venous hypertension.  Weight dropped 20 lbs and I backed off on her Lasix to 40 qam/20 qpm alternating with 40 mg bid with rise in BUN/creatinine.    Echo 2/19 EF 55-60%, moderate LVH, PASP 65 mmHg, moderate AS with mean gradient 23 mmHg, severe biatrial enlargement.   Echo was done in 2/20 showing EF 60-65%, moderate LVH, normal RV, severe TR, moderate MR, suspected paradoxical low flow/low gradient aortic stenosis with mean gradient 25 mmHg/AVA 0.83 cm^2, mild AI.   She returns today for followup of CHF.  Weight is down 4 lbs. She is short of breath generally in the morning, gets better through the day. She uses a walker and tries to walk 10 laps around her house daily for exercise. Long distances (such as walking into the office today) get her short of breath.  She is also limited by back pain.  No orthopnea/PND. No chest pain.   Labs (1/18): creatinine 1.06 Labs (4/18): K 3.9, creatinine 1.04 Labs (5/18): K 4.2, creatinine 1.18 Labs (9/18): K 4.2, creatinine 1.23 Labs (1/19): K 4.4, creatinine 1.30 Labs (2/19): K 4.3, creatinine  1.15 Labs (3/19): K 4.4, creatinine 1.22 Labs (4/19): K 4.4, creatinine 1.2 Labs (8/19): K 4.3, creatinine 1.19, LDL 63 Labs (12/19): K 3.8, creatinine 1.27  PMH: 1. Atrial fibrillation: Permanent. Present since 2002.  2. H/o cholecystectomy  3. Hypothyroidism 4. HTN 5. Hyperlipidemia 6. OSA: Cannot tolerate CPAP.  7. CKD stage III 8. Chronic diastolic CHF: Echo (1/06) with EF 50-55%, moderate to severe LVH, moderate AS (AVA 1.0 cm^2, mean gradient 21 mmHg), mild AI, mild RV dilation with moderately decreased RV systolic function, moderate to severe TR, PASP 69 mmHg, moderate mitral regurgitation. PYP scan (12/19) with H:CL 1.46, grade 1 visual => unlikely to be TTR amyloidosis.  - RHC (4/18): mean RA 14, PA 61/24 mean 41, mean PCWP 28, CI 3.48, PVR 1.8 WU.  - Echo (2/19): EF 55-60%, moderate LVH, mildly dilated RV with normal systolic function, PASP 65 mmHg, moderate AS with mean gradient 23 mmHg and AVA 1.2 cm^2, mild to moderate MR, severe biatrial enlargement, dilated IVC.  - Echo (2/20): EF 60-65%, moderate LVH, normal RV, severe TR, moderate MR, suspected paradoxical low flow/low gradient aortic stenosis with mean gradient 25 mmHg/AVA 0.83 cm^2, mild AI.  9. Aortic stenosis: Echo in 2/20 concerning for paradoxical low flow/low gradient severe aortic stenosis.   Social History   Socioeconomic History  . Marital status: Married    Spouse name: Not on file  . Number of children: 3  . Years of education: HS  .  Highest education level: Not on file  Occupational History  . Not on file  Social Needs  . Financial resource strain: Not on file  . Food insecurity:    Worry: Not on file    Inability: Not on file  . Transportation needs:    Medical: Not on file    Non-medical: Not on file  Tobacco Use  . Smoking status: Never Smoker  . Smokeless tobacco: Never Used  Substance and Sexual Activity  . Alcohol use: No  . Drug use: No  . Sexual activity: Not on file  Lifestyle  .  Physical activity:    Days per week: Not on file    Minutes per session: Not on file  . Stress: Not on file  Relationships  . Social connections:    Talks on phone: Not on file    Gets together: Not on file    Attends religious service: Not on file    Active member of club or organization: Not on file    Attends meetings of clubs or organizations: Not on file    Relationship status: Not on file  . Intimate partner violence:    Fear of current or ex partner: Not on file    Emotionally abused: Not on file    Physically abused: Not on file    Forced sexual activity: Not on file  Other Topics Concern  . Not on file  Social History Narrative   Lives at home with two daughters and her friend.   Left-handed.   No caffeine use.   Family History  Problem Relation Age of Onset  . Heart disease Mother   . Heart disease Father   . Heart disease Sister   . Diabetes Sister    ROS: All systems reviewed and negative except as per HPI.  Current Outpatient Medications  Medication Sig Dispense Refill  . acetaminophen (TYLENOL) 500 MG tablet Take 1,000-2,000 mg by mouth 2 (two) times daily as needed for moderate pain or headache.     . alendronate (FOSAMAX) 70 MG tablet Take 70 mg by mouth every Wednesday.   11  . Calcium Carb-Cholecalciferol (CALCIUM + D3) 600-200 MG-UNIT TABS Take 1 tablet by mouth in the morning and 2 tablets at night    . dextromethorphan (DELSYM) 30 MG/5ML liquid Take 30 mg by mouth as needed for cough.    . furosemide (LASIX) 40 MG tablet Take 2 tablets (80 mg total) by mouth every morning AND 1 tablet (40 mg total) every evening. 270 tablet 3  . gabapentin (NEURONTIN) 100 MG capsule Take 1 capsule by mouth 3 (three) times daily.    . Glucosamine HCl (GLUCOSAMINE MAXIMUM STRENGTH PO) Take by mouth daily.    Marland Kitchen levothyroxine (SYNTHROID, LEVOTHROID) 137 MCG tablet Take 137 mcg by mouth daily before breakfast.    . loratadine (CLARITIN) 10 MG tablet Take 10 mg by mouth daily  as needed for allergies.    Marland Kitchen lovastatin (MEVACOR) 40 MG tablet Take 40 mg by mouth at bedtime.    . metoprolol succinate (TOPROL-XL) 25 MG 24 hr tablet Take 25 mg by mouth daily.    . Multiple Vitamin (MULTIVITAMIN) tablet Take 1 tablet by mouth daily.    . Multiple Vitamins-Minerals (EYE VITAMINS PO) Take 1 capsule by mouth 2 (two) times daily.     . Polyvinyl Alcohol-Povidone (REFRESH OP) Apply 1 drop to eye daily.    . ramipril (ALTACE) 2.5 MG capsule Take 1 capsule (2.5 mg total) by  mouth daily. 90 capsule 1  . warfarin (COUMADIN) 5 MG tablet TAKE 1 TABLET DAILY OR AS DIRECTED BY COUMADIN CLINIC 90 tablet 1   No current facility-administered medications for this encounter.    BP 140/68   Pulse 92   Wt 95.5 kg (210 lb 9.6 oz)   SpO2 93%   BMI 36.15 kg/m   Filed Weights   11/02/18 0954  Weight: 95.5 kg (210 lb 9.6 oz)   General: NAD Neck: JVP 8 cm, no thyromegaly or thyroid nodule.  Lungs: Clear to auscultation bilaterally with normal respiratory effort. CV: Nondisplaced PMI.  Heart irregular S1/S2, no S3/S4, 3/6 SEM RUSB with some obscuration of S2.  1+ ankle edema.  No carotid bruit.  Normal pedal pulses.  Abdomen: Soft, nontender, no hepatosplenomegaly, no distention.  Skin: Intact without lesions or rashes.  Neurologic: Alert and oriented x 3.  Psych: Normal affect. Extremities: No clubbing or cyanosis.  HEENT: Normal.   Assessment/Plan: 1. Atrial fibrillation: Permanent. She is on warfarin with no problems. Rate controlled with Toprol XL. - Continue warfarin. CBC today.  2. Chronic diastolic CHF: With prominent RV failure. [place] in 4/18 showed pulmonary venous hypertension with elevated left and right heart filling pressures.  [facility] scan in 12/19 was not suggestive of TTR amyloidosis.  NYHA class III symptoms, chronic. Mild volume overload on exam today.   - Continue current Lasix dose for now, will adjust based on results of RHC (see below).  3. Aortic stenosis: Soft S2 on  exam concerning for severe AS.  I reviewed her recent echo from 2/20.  I suspect that she has paradoxical low flow/low gradient severe AS.  NYHA class III symptoms, I would like to evaluate her for AVR.  - I will arrange for TEE to assess the valve more closely, confirm severe AS.  - I will plan for RHC/LHC pre-TAVR. She will need to hold warfarin prior to cath, will discuss with coumadin clinic whether she will need Lovenox bridge.  - We discussed risks/benefits of procedures and she agrees to them.  - I will refer her to structural heart clinic for TAVR evaluation (after TEE and caths).  I think she would be a reasonable candidate.   4. Pulmonary hypertension: She has group 2 PH due to LV diastolic dysfunction/elevated LA pressure (pulmonary venous hypertension).   - Continue lasix as above.  5. Tricuspid regurgitation: Severe on 2/20 echo.  Will reassess with TEE.   Followup 2 wks after cath.    Loralie Champagne, MD 11/02/2018

## 2018-11-02 NOTE — Patient Instructions (Signed)
Routine lab work today. Will notify you of abnormal results  Your physician has requested that you have a TEE and heart cath on Thursday March 12th 2020.  (please see instruction letter)  Follow up 2 weeks after cath

## 2018-11-03 NOTE — Telephone Encounter (Signed)
Spoke with pt to relay below messages, no further questions per patient.

## 2018-11-04 ENCOUNTER — Encounter: Payer: Self-pay | Admitting: Adult Health

## 2018-11-11 ENCOUNTER — Other Ambulatory Visit: Payer: Self-pay

## 2018-11-11 ENCOUNTER — Telehealth: Payer: Self-pay | Admitting: Cardiology

## 2018-11-11 ENCOUNTER — Encounter (HOSPITAL_COMMUNITY)
Admission: RE | Disposition: A | Discharge status: Home / Self Care | Payer: Self-pay | Source: Home / Self Care | Attending: Cardiology

## 2018-11-11 ENCOUNTER — Encounter (HOSPITAL_COMMUNITY): Payer: Self-pay | Admitting: *Deleted

## 2018-11-11 ENCOUNTER — Ambulatory Visit (HOSPITAL_BASED_OUTPATIENT_CLINIC_OR_DEPARTMENT_OTHER): Payer: Medicare HMO

## 2018-11-11 ENCOUNTER — Ambulatory Visit (HOSPITAL_COMMUNITY)
Admission: RE | Admit: 2018-11-11 | Discharge: 2018-11-11 | Disposition: A | Discharge status: Home / Self Care | Payer: Medicare HMO | Attending: Cardiology | Admitting: Cardiology

## 2018-11-11 DIAGNOSIS — E785 Hyperlipidemia, unspecified: Secondary | ICD-10-CM | POA: Insufficient documentation

## 2018-11-11 DIAGNOSIS — Z79899 Other long term (current) drug therapy: Secondary | ICD-10-CM | POA: Diagnosis not present

## 2018-11-11 DIAGNOSIS — G4733 Obstructive sleep apnea (adult) (pediatric): Secondary | ICD-10-CM | POA: Diagnosis not present

## 2018-11-11 DIAGNOSIS — Z8249 Family history of ischemic heart disease and other diseases of the circulatory system: Secondary | ICD-10-CM | POA: Insufficient documentation

## 2018-11-11 DIAGNOSIS — I350 Nonrheumatic aortic (valve) stenosis: Secondary | ICD-10-CM | POA: Diagnosis not present

## 2018-11-11 DIAGNOSIS — I083 Combined rheumatic disorders of mitral, aortic and tricuspid valves: Secondary | ICD-10-CM | POA: Diagnosis not present

## 2018-11-11 DIAGNOSIS — I130 Hypertensive heart and chronic kidney disease with heart failure and stage 1 through stage 4 chronic kidney disease, or unspecified chronic kidney disease: Secondary | ICD-10-CM | POA: Insufficient documentation

## 2018-11-11 DIAGNOSIS — I420 Dilated cardiomyopathy: Secondary | ICD-10-CM | POA: Insufficient documentation

## 2018-11-11 DIAGNOSIS — E039 Hypothyroidism, unspecified: Secondary | ICD-10-CM | POA: Diagnosis not present

## 2018-11-11 DIAGNOSIS — I5032 Chronic diastolic (congestive) heart failure: Secondary | ICD-10-CM | POA: Diagnosis not present

## 2018-11-11 DIAGNOSIS — I4821 Permanent atrial fibrillation: Secondary | ICD-10-CM | POA: Diagnosis not present

## 2018-11-11 DIAGNOSIS — N183 Chronic kidney disease, stage 3 (moderate): Secondary | ICD-10-CM | POA: Diagnosis not present

## 2018-11-11 DIAGNOSIS — Z9049 Acquired absence of other specified parts of digestive tract: Secondary | ICD-10-CM | POA: Diagnosis not present

## 2018-11-11 DIAGNOSIS — Z7901 Long term (current) use of anticoagulants: Secondary | ICD-10-CM | POA: Insufficient documentation

## 2018-11-11 DIAGNOSIS — I272 Pulmonary hypertension, unspecified: Secondary | ICD-10-CM | POA: Insufficient documentation

## 2018-11-11 DIAGNOSIS — Z79890 Hormone replacement therapy (postmenopausal): Secondary | ICD-10-CM | POA: Insufficient documentation

## 2018-11-11 HISTORY — PX: TEE WITHOUT CARDIOVERSION: SHX5443

## 2018-11-11 HISTORY — PX: RIGHT/LEFT HEART CATH AND CORONARY ANGIOGRAPHY: CATH118266

## 2018-11-11 LAB — POCT I-STAT EG7
Acid-Base Excess: 6 mmol/L — ABNORMAL HIGH (ref 0.0–2.0)
Acid-Base Excess: 6 mmol/L — ABNORMAL HIGH (ref 0.0–2.0)
Bicarbonate: 32.5 mmol/L — ABNORMAL HIGH (ref 20.0–28.0)
Bicarbonate: 33.1 mmol/L — ABNORMAL HIGH (ref 20.0–28.0)
Calcium, Ion: 1.2 mmol/L (ref 1.15–1.40)
Calcium, Ion: 1.24 mmol/L (ref 1.15–1.40)
HCT: 29 % — ABNORMAL LOW (ref 36.0–46.0)
HCT: 30 % — ABNORMAL LOW (ref 36.0–46.0)
Hemoglobin: 10.2 g/dL — ABNORMAL LOW (ref 12.0–15.0)
Hemoglobin: 9.9 g/dL — ABNORMAL LOW (ref 12.0–15.0)
O2 Saturation: 78 %
O2 Saturation: 79 %
PCO2 VEN: 58.3 mmHg (ref 44.0–60.0)
PH VEN: 7.354 (ref 7.250–7.430)
Potassium: 3.8 mmol/L (ref 3.5–5.1)
Potassium: 3.8 mmol/L (ref 3.5–5.1)
Sodium: 140 mmol/L (ref 135–145)
Sodium: 141 mmol/L (ref 135–145)
TCO2: 34 mmol/L — ABNORMAL HIGH (ref 22–32)
TCO2: 35 mmol/L — ABNORMAL HIGH (ref 22–32)
pCO2, Ven: 59.3 mmHg (ref 44.0–60.0)
pH, Ven: 7.353 (ref 7.250–7.430)
pO2, Ven: 46 mmHg — ABNORMAL HIGH (ref 32.0–45.0)
pO2, Ven: 46 mmHg — ABNORMAL HIGH (ref 32.0–45.0)

## 2018-11-11 LAB — PROTIME-INR
INR: 1.3 — ABNORMAL HIGH (ref 0.8–1.2)
PROTHROMBIN TIME: 15.6 s — AB (ref 11.4–15.2)

## 2018-11-11 SURGERY — ECHOCARDIOGRAM, TRANSESOPHAGEAL
Anesthesia: Moderate Sedation

## 2018-11-11 SURGERY — RIGHT/LEFT HEART CATH AND CORONARY ANGIOGRAPHY
Anesthesia: LOCAL

## 2018-11-11 MED ORDER — HEPARIN (PORCINE) IN NACL 1000-0.9 UT/500ML-% IV SOLN
INTRAVENOUS | Status: DC | PRN
  Administered 2018-11-11 (×2): 500 mL

## 2018-11-11 MED ORDER — IOHEXOL 350 MG/ML SOLN
INTRAVENOUS | Status: DC | PRN
  Administered 2018-11-11: 45 mL via INTRA_ARTERIAL

## 2018-11-11 MED ORDER — HEPARIN SODIUM (PORCINE) 1000 UNIT/ML IJ SOLN
INTRAMUSCULAR | Status: DC | PRN
  Administered 2018-11-11: 5000 [IU] via INTRAVENOUS

## 2018-11-11 MED ORDER — MIDAZOLAM HCL (PF) 5 MG/ML IJ SOLN
INTRAMUSCULAR | Status: AC
  Filled 2018-11-11: qty 2

## 2018-11-11 MED ORDER — WARFARIN SODIUM 5 MG PO TABS: ORAL_TABLET | ORAL | 1 refills | Status: AC

## 2018-11-11 MED ORDER — SODIUM CHLORIDE 0.9 % IV SOLN: INTRAVENOUS | Status: DC

## 2018-11-11 MED ORDER — ONDANSETRON HCL 4 MG/2ML IJ SOLN: 4.0000 mg | Freq: Four times a day (QID) | INTRAMUSCULAR | Status: DC | PRN

## 2018-11-11 MED ORDER — HEPARIN (PORCINE) IN NACL 1000-0.9 UT/500ML-% IV SOLN
INTRAVENOUS | Status: AC
  Filled 2018-11-11: qty 1000

## 2018-11-11 MED ORDER — ACETAMINOPHEN 325 MG PO TABS: 650.0000 mg | ORAL_TABLET | ORAL | Status: DC | PRN

## 2018-11-11 MED ORDER — HEPARIN SODIUM (PORCINE) 1000 UNIT/ML IJ SOLN
INTRAMUSCULAR | Status: AC
  Filled 2018-11-11: qty 1

## 2018-11-11 MED ORDER — FENTANYL CITRATE (PF) 100 MCG/2ML IJ SOLN
INTRAMUSCULAR | Status: AC
  Filled 2018-11-11: qty 2

## 2018-11-11 MED ORDER — SODIUM CHLORIDE 0.9% FLUSH: 3.0000 mL | Freq: Two times a day (BID) | INTRAVENOUS | Status: DC

## 2018-11-11 MED ORDER — SODIUM CHLORIDE 0.9 % IV SOLN: 250.0000 mL | INTRAVENOUS | Status: DC | PRN

## 2018-11-11 MED ORDER — SODIUM CHLORIDE 0.9% FLUSH: 3.0000 mL | INTRAVENOUS | Status: DC | PRN

## 2018-11-11 MED ORDER — ENOXAPARIN SODIUM 100 MG/ML ~~LOC~~ SOLN: 100.0000 mg | Freq: Two times a day (BID) | SUBCUTANEOUS | 0 refills | Status: DC

## 2018-11-11 MED ORDER — FENTANYL CITRATE (PF) 100 MCG/2ML IJ SOLN
INTRAMUSCULAR | Status: DC | PRN
  Administered 2018-11-11 (×2): 25 ug via INTRAVENOUS

## 2018-11-11 MED ORDER — BUTAMBEN-TETRACAINE-BENZOCAINE 2-2-14 % EX AERO
INHALATION_SPRAY | CUTANEOUS | Status: DC | PRN
  Administered 2018-11-11: 2 via TOPICAL

## 2018-11-11 MED ORDER — LIDOCAINE HCL (PF) 1 % IJ SOLN
INTRAMUSCULAR | Status: AC
  Filled 2018-11-11: qty 30

## 2018-11-11 MED ORDER — MIDAZOLAM HCL 2 MG/2ML IJ SOLN
INTRAMUSCULAR | Status: AC
  Filled 2018-11-11: qty 2

## 2018-11-11 MED ORDER — VERAPAMIL HCL 2.5 MG/ML IV SOLN
INTRAVENOUS | Status: AC
  Filled 2018-11-11: qty 2

## 2018-11-11 MED ORDER — LIDOCAINE HCL (PF) 1 % IJ SOLN
INTRAMUSCULAR | Status: DC | PRN
  Administered 2018-11-11: 5 mL

## 2018-11-11 MED ORDER — MIDAZOLAM HCL (PF) 10 MG/2ML IJ SOLN
INTRAMUSCULAR | Status: DC | PRN
  Administered 2018-11-11: 1 mg via INTRAVENOUS
  Administered 2018-11-11: 2 mg via INTRAVENOUS

## 2018-11-11 MED ORDER — FENTANYL CITRATE (PF) 100 MCG/2ML IJ SOLN
INTRAMUSCULAR | Status: DC | PRN
  Administered 2018-11-11: 25 ug via INTRAVENOUS

## 2018-11-11 MED ORDER — DIPHENHYDRAMINE HCL 50 MG/ML IJ SOLN
INTRAMUSCULAR | Status: AC
  Filled 2018-11-11: qty 1

## 2018-11-11 MED ORDER — MIDAZOLAM HCL 2 MG/2ML IJ SOLN
INTRAMUSCULAR | Status: DC | PRN
  Administered 2018-11-11: 1 mg via INTRAVENOUS

## 2018-11-11 MED ORDER — VERAPAMIL HCL 2.5 MG/ML IV SOLN
INTRAVENOUS | Status: DC | PRN
  Administered 2018-11-11: 13:00:00 via INTRA_ARTERIAL

## 2018-11-11 SURGICAL SUPPLY — 12 items

## 2018-11-11 NOTE — Discharge Instructions (Signed)
Radial Site Care ° °This sheet gives you information about how to care for yourself after your procedure. Your health care provider may also give you more specific instructions. If you have problems or questions, contact your health care provider. °What can I expect after the procedure? °After the procedure, it is common to have: °· Bruising and tenderness at the catheter insertion area. °Follow these instructions at home: °Medicines °· Take over-the-counter and prescription medicines only as told by your health care provider. °Insertion site care °· Follow instructions from your health care provider about how to take care of your insertion site. Make sure you: °? Wash your hands with soap and water before you change your bandage (dressing). If soap and water are not available, use hand sanitizer. °? Change your dressing as told by your health care provider. °? Leave stitches (sutures), skin glue, or adhesive strips in place. These skin closures may need to stay in place for 2 weeks or longer. If adhesive strip edges start to loosen and curl up, you may trim the loose edges. Do not remove adhesive strips completely unless your health care provider tells you to do that. °· Check your insertion site every day for signs of infection. Check for: °? Redness, swelling, or pain. °? Fluid or blood. °? Pus or a bad smell. °? Warmth. °· Do not take baths, swim, or use a hot tub until your health care provider approves. °· You may shower 24-48 hours after the procedure, or as directed by your health care provider. °? Remove the dressing and gently wash the site with plain soap and water. °? Pat the area dry with a clean towel. °? Do not rub the site. That could cause bleeding. °· Do not apply powder or lotion to the site. °Activity ° °· For 24 hours after the procedure, or as directed by your health care provider: °? Do not flex or bend the affected arm. °? Do not push or pull heavy objects with the affected arm. °? Do not  drive yourself home from the hospital or clinic. You may drive 24 hours after the procedure unless your health care provider tells you not to. °? Do not operate machinery or power tools. °· Do not lift anything that is heavier than 10 lb (4.5 kg), or the limit that you are told, until your health care provider says that it is safe. °· Ask your health care provider when it is okay to: °? Return to work or school. °? Resume usual physical activities or sports. °? Resume sexual activity. °General instructions °· If the catheter site starts to bleed, raise your arm and put firm pressure on the site. If the bleeding does not stop, get help right away. This is a medical emergency. °· If you went home on the same day as your procedure, a responsible adult should be with you for the first 24 hours after you arrive home. °· Keep all follow-up visits as told by your health care provider. This is important. °Contact a health care provider if: °· You have a fever. °· You have redness, swelling, or yellow drainage around your insertion site. °Get help right away if: °· You have unusual pain at the radial site. °· The catheter insertion area swells very fast. °· The insertion area is bleeding, and the bleeding does not stop when you hold steady pressure on the area. °· Your arm or hand becomes pale, cool, tingly, or numb. °These symptoms may represent a serious problem   that is an emergency. Do not wait to see if the symptoms will go away. Get medical help right away. Call your local emergency services (911 in the [place] Do not drive yourself to the hospital. Summary  After the procedure, it is common to have bruising and tenderness at the site.  Follow instructions from your health care provider about how to take care of your radial site wound. Check the wound every day for signs of infection.  Do not lift anything that is heavier than 10 lb (4.5 kg), or the limit that you are told, until your health care provider says  that it is safe. This information is not intended to replace advice given to you by your health care provider. Make sure you discuss any questions you have with your health care provider. Document Released: 09/20/2010 Document Revised: 09/23/2017 Document Reviewed: 09/23/2017 Elsevier Interactive Patient Education  2019 [place].   Moderate Conscious Sedation, Adult, Care After These instructions provide you with information about caring for yourself after your procedure. Your health care provider may also give you more specific instructions. Your treatment has been planned according to current medical practices, but problems sometimes occur. Call your health care provider if you have any problems or questions after your procedure. What can I expect after the procedure? After your procedure, it is common:  To feel sleepy for several hours.  To feel clumsy and have poor balance for several hours.  To have poor judgment for several hours.  To vomit if you eat too soon. Follow these instructions at home: For at least 24 hours after the procedure:   Do not: ? Participate in activities where you could fall or become injured. ? Drive. ? Use heavy machinery. ? Drink alcohol. ? Take sleeping pills or medicines that cause drowsiness. ? Make important decisions or sign legal documents. ? Take care of children on your own.  Rest. Eating and drinking  Follow the diet recommended by your health care provider.  If you vomit: ? Drink water, juice, or soup when you can drink without vomiting. ? Make sure you have little or no nausea before eating solid foods. General instructions  Have a responsible adult stay with you until you are awake and alert.  Take over-the-counter and prescription medicines only as told by your health care provider.  If you smoke, do not smoke without supervision.  Keep all follow-up visits as told by your health care provider. This is important. Contact  a health care provider if:  You keep feeling nauseous or you keep vomiting.  You feel light-headed.  You develop a rash.  You have a fever. Get help right away if:  You have trouble breathing. This information is not intended to replace advice given to you by your health care provider. Make sure you discuss any questions you have with your health care provider. Document Released: 06/08/2013 Document Revised: 01/21/2016 Document Reviewed: 12/08/2015 Elsevier Interactive Patient Education  2019 [employer]. TEE  YOU HAD AN CARDIAC PROCEDURE TODAY: Refer to the procedure report and other information in the discharge instructions given to you for any specific questions about what was found during the examination. If this information does not answer your questions, please call [facility] office at [phone] to clarify.   DIET: Your first meal following the procedure should be a light meal and then it is ok to progress to your normal diet. A half-sandwich or bowl of soup is an example of a good first meal.  Heavy or fried foods are harder to digest and may make you feel nauseous or bloated. Drink plenty of fluids but you should avoid alcoholic beverages for 24 hours. If you had a esophageal dilation, please see attached instructions for diet.   ACTIVITY: Your care partner should take you home directly after the procedure. You should plan to take it easy, moving slowly for the rest of the day. You can resume normal activity the day after the procedure however YOU SHOULD NOT DRIVE, use power tools, machinery or perform tasks that involve climbing or major physical exertion for 24 hours (because of the sedation medicines used during the test).   SYMPTOMS TO REPORT IMMEDIATELY: A cardiologist can be reached at any hour. Please call [phone] for any of the following symptoms:  Vomiting of blood or coffee ground material  New, significant abdominal pain  New, significant chest pain or  pain under the shoulder blades  Painful or persistently difficult swallowing  New shortness of breath  Black, tarry-looking or red, bloody stools  FOLLOW UP:  Please also call with any specific questions about appointments or follow up tests.

## 2018-11-11 NOTE — Interval H&P Note (Signed)
History and Physical Interval Note:  11/11/2018 12:26 PM  Sandra Merritt  has presented today for surgery, with the diagnosis of aortic stenosis.  The various methods of treatment have been discussed with the patient and family. After consideration of risks, benefits and other options for treatment, the patient has consented to  Procedure(s): RIGHT/LEFT HEART CATH AND CORONARY ANGIOGRAPHY (N/A) as a surgical intervention.  The patient's history has been reviewed, patient examined, no change in status, stable for surgery.  I have reviewed the patient's chart and labs.  Questions were answered to the patient's satisfaction.     Arlin Savona [employer]

## 2018-11-11 NOTE — Progress Notes (Signed)
Patient transferred by Endo staff to cath lab holding.

## 2018-11-11 NOTE — Telephone Encounter (Signed)
° °  Unable to contact pharmacy staff [facility] calling for Coumadin appt for 3/16 or 3/17 only. Patient being discharged from hospital Scheduler needing approval to double book and preferred time.   Please contact [facility] at [phone]

## 2018-11-11 NOTE — Interval H&P Note (Signed)
History and Physical Interval Note:  11/11/2018 10:33 AM  Sandra Merritt  has presented today for surgery, with the diagnosis of AORTIC STENOSIS.  The various methods of treatment have been discussed with the patient and family. After consideration of risks, benefits and other options for treatment, the patient has consented to  Procedure(s) with comments: TRANSESOPHAGEAL ECHOCARDIOGRAM (TEE) (N/A) - CATH AFTER PROCEDURE as a surgical intervention.  The patient's history has been reviewed, patient examined, no change in status, stable for surgery.  I have reviewed the patient's chart and labs.  Questions were answered to the patient's satisfaction.     [name] [employer]

## 2018-11-11 NOTE — Telephone Encounter (Signed)
Scheduled appointment for 3/17 at 9:30. Patient is being bridged with lovenox due to thrombus found and INR is subtherapeutic due to hold for heart cath on 3/12.

## 2018-11-11 NOTE — Progress Notes (Signed)
  Echocardiogram Echocardiogram Transesophageal has been performed.  Jannett Celestine 11/11/2018, 11:06 AM

## 2018-11-12 ENCOUNTER — Encounter (HOSPITAL_COMMUNITY): Payer: Self-pay | Admitting: Cardiology

## 2018-11-12 ENCOUNTER — Other Ambulatory Visit (HOSPITAL_COMMUNITY): Payer: Self-pay

## 2018-11-12 NOTE — Progress Notes (Signed)
Opened in error

## 2018-11-13 ENCOUNTER — Encounter (HOSPITAL_COMMUNITY): Payer: Self-pay | Admitting: Cardiology

## 2018-11-16 ENCOUNTER — Other Ambulatory Visit: Payer: Self-pay

## 2018-11-16 ENCOUNTER — Ambulatory Visit (INDEPENDENT_AMBULATORY_CARE_PROVIDER_SITE_OTHER): Payer: Medicare HMO | Admitting: *Deleted

## 2018-11-16 DIAGNOSIS — I4821 Permanent atrial fibrillation: Secondary | ICD-10-CM | POA: Diagnosis not present

## 2018-11-16 DIAGNOSIS — Z5181 Encounter for therapeutic drug level monitoring: Secondary | ICD-10-CM | POA: Diagnosis not present

## 2018-11-16 LAB — POCT INR: INR: 2.9 (ref 2.0–3.0)

## 2018-11-16 NOTE — Patient Instructions (Signed)
Description   Continue on same dosage 1 tablet daily except 1/2 tablet on Tuesdays, Thursdays, and Saturdays. Recheck in 9 days with MD appt. Call [phone] if scheduled for any procedure or any change in medications

## 2018-11-17 ENCOUNTER — Other Ambulatory Visit: Payer: Self-pay

## 2018-11-17 ENCOUNTER — Inpatient Hospital Stay: Payer: Medicare HMO | Attending: Adult Health | Admitting: Adult Health

## 2018-11-17 ENCOUNTER — Telehealth: Payer: Self-pay | Admitting: Oncology

## 2018-11-17 ENCOUNTER — Encounter: Payer: Self-pay | Admitting: Adult Health

## 2018-11-17 ENCOUNTER — Inpatient Hospital Stay: Payer: Medicare HMO

## 2018-11-17 VITALS — BP 106/42 | HR 63 | Temp 97.8°F | Resp 17 | Ht 64.0 in | Wt 207.7 lb

## 2018-11-17 DIAGNOSIS — M8580 Other specified disorders of bone density and structure, unspecified site: Secondary | ICD-10-CM | POA: Diagnosis not present

## 2018-11-17 DIAGNOSIS — E559 Vitamin D deficiency, unspecified: Secondary | ICD-10-CM | POA: Diagnosis not present

## 2018-11-17 DIAGNOSIS — E785 Hyperlipidemia, unspecified: Secondary | ICD-10-CM | POA: Insufficient documentation

## 2018-11-17 DIAGNOSIS — I129 Hypertensive chronic kidney disease with stage 1 through stage 4 chronic kidney disease, or unspecified chronic kidney disease: Secondary | ICD-10-CM | POA: Diagnosis not present

## 2018-11-17 DIAGNOSIS — M1990 Unspecified osteoarthritis, unspecified site: Secondary | ICD-10-CM | POA: Diagnosis not present

## 2018-11-17 DIAGNOSIS — E669 Obesity, unspecified: Secondary | ICD-10-CM

## 2018-11-17 DIAGNOSIS — I272 Pulmonary hypertension, unspecified: Secondary | ICD-10-CM | POA: Diagnosis not present

## 2018-11-17 DIAGNOSIS — D61818 Other pancytopenia: Secondary | ICD-10-CM

## 2018-11-17 DIAGNOSIS — I350 Nonrheumatic aortic (valve) stenosis: Secondary | ICD-10-CM | POA: Insufficient documentation

## 2018-11-17 DIAGNOSIS — Z7901 Long term (current) use of anticoagulants: Secondary | ICD-10-CM | POA: Diagnosis not present

## 2018-11-17 DIAGNOSIS — E039 Hypothyroidism, unspecified: Secondary | ICD-10-CM | POA: Diagnosis not present

## 2018-11-17 DIAGNOSIS — G4730 Sleep apnea, unspecified: Secondary | ICD-10-CM | POA: Insufficient documentation

## 2018-11-17 DIAGNOSIS — N183 Chronic kidney disease, stage 3 (moderate): Secondary | ICD-10-CM | POA: Diagnosis not present

## 2018-11-17 DIAGNOSIS — Z86010 Personal history of colonic polyps: Secondary | ICD-10-CM | POA: Diagnosis not present

## 2018-11-17 DIAGNOSIS — R5383 Other fatigue: Secondary | ICD-10-CM | POA: Diagnosis not present

## 2018-11-17 DIAGNOSIS — R413 Other amnesia: Secondary | ICD-10-CM | POA: Diagnosis not present

## 2018-11-17 DIAGNOSIS — I4891 Unspecified atrial fibrillation: Secondary | ICD-10-CM | POA: Diagnosis not present

## 2018-11-17 DIAGNOSIS — Z79899 Other long term (current) drug therapy: Secondary | ICD-10-CM

## 2018-11-17 DIAGNOSIS — J449 Chronic obstructive pulmonary disease, unspecified: Secondary | ICD-10-CM

## 2018-11-17 DIAGNOSIS — D7589 Other specified diseases of blood and blood-forming organs: Secondary | ICD-10-CM | POA: Diagnosis not present

## 2018-11-17 LAB — CMP (CANCER CENTER ONLY)
ALT: 24 U/L (ref 0–44)
AST: 37 U/L (ref 15–41)
Albumin: 3.5 g/dL (ref 3.5–5.0)
Alkaline Phosphatase: 143 U/L — ABNORMAL HIGH (ref 38–126)
Anion gap: 11 (ref 5–15)
BUN: 21 mg/dL (ref 8–23)
CO2: 29 mmol/L (ref 22–32)
Calcium: 9.3 mg/dL (ref 8.9–10.3)
Chloride: 101 mmol/L (ref 98–111)
Creatinine: 1.1 mg/dL — ABNORMAL HIGH (ref 0.44–1.00)
GFR, Est AFR Am: 53 mL/min — ABNORMAL LOW (ref >60)
GFR, Estimated: 46 mL/min — ABNORMAL LOW (ref >60)
Glucose, Bld: 94 mg/dL (ref 70–99)
Potassium: 4.1 mmol/L (ref 3.5–5.1)
SODIUM: 141 mmol/L (ref 135–145)
Total Bilirubin: 1.1 mg/dL (ref 0.3–1.2)
Total Protein: 5.9 g/dL — ABNORMAL LOW (ref 6.5–8.1)

## 2018-11-17 LAB — CBC WITH DIFFERENTIAL (CANCER CENTER ONLY)
Abs Immature Granulocytes: 0.01 K/uL (ref 0.00–0.07)
Basophils Absolute: 0 K/uL (ref 0.0–0.1)
Basophils Relative: 1 %
Eosinophils Absolute: 0 K/uL (ref 0.0–0.5)
Eosinophils Relative: 1 %
HCT: 33.1 % — ABNORMAL LOW (ref 36.0–46.0)
Hemoglobin: 10.6 g/dL — ABNORMAL LOW (ref 12.0–15.0)
Immature Granulocytes: 0 %
Lymphocytes Relative: 25 %
Lymphs Abs: 0.8 K/uL (ref 0.7–4.0)
MCH: 35.5 pg — ABNORMAL HIGH (ref 26.0–34.0)
MCHC: 32 g/dL (ref 30.0–36.0)
MCV: 110.7 fL — ABNORMAL HIGH (ref 80.0–100.0)
Monocytes Absolute: 0.5 K/uL (ref 0.1–1.0)
Monocytes Relative: 16 %
Neutro Abs: 1.9 K/uL (ref 1.7–7.7)
Neutrophils Relative %: 57 %
Platelet Count: 76 K/uL — ABNORMAL LOW (ref 150–400)
RBC: 2.99 MIL/uL — ABNORMAL LOW (ref 3.87–5.11)
RDW: 15.6 % — ABNORMAL HIGH (ref 11.5–15.5)
WBC Count: 3.3 K/uL — ABNORMAL LOW (ref 4.0–10.5)
nRBC: 0 % (ref 0.0–0.2)

## 2018-11-17 LAB — FOLATE: Folate: 48.9 ng/mL (ref >5.9)

## 2018-11-17 LAB — VITAMIN B12: Vitamin B-12: 1106 pg/mL — ABNORMAL HIGH (ref 180–914)

## 2018-11-17 LAB — RETICULOCYTES
Immature Retic Fract: 14.3 % (ref 2.3–15.9)
RBC.: 2.99 MIL/uL — ABNORMAL LOW (ref 3.87–5.11)
Retic Count, Absolute: 44 10*3/uL (ref 19.0–186.0)
Retic Ct Pct: 1.5 % (ref 0.4–3.1)

## 2018-11-17 LAB — SAVE SMEAR(SSMR), FOR PROVIDER SLIDE REVIEW

## 2018-11-17 NOTE — Telephone Encounter (Signed)
Gave avs and calendar ° °

## 2018-11-17 NOTE — Progress Notes (Addendum)
[place]  [phone] [phone]     ID: Sandra Merritt DOB: 1932-02-24  MR#: [id number]  LNL#:[id number]  Patient Care Team: Jonathon Jordan, MD as PCP - General (Family Medicine) Sueanne Margarita, MD as PCP - Cardiology (Cardiology) Scot Dock, NP OTHER MD:  CHIEF COMPLAINT: pancytopenia   HISTORY OF CURRENT ILLNESS: Sandra Merritt is here today for evaluation of persistent pancytopenia, with macrocytosis.  Her platelets have decreased from 120s in 10/2017 to 76 today, along with decreases noted in her hemoglobin and WBCs.   She is undergoing a work up for potential heart valve replacement with Dr. Burt Knack for aortic stenosis, and wanted to talk to a hematologist prior to her consultation with Dr. Burt Knack.    The patient's subsequent history is as detailed below.  INTERVAL HISTORY: Sandra Merritt is doing moderately well.  She is fatigued, and notes she has sleep apnea and will not wear cpap.  She says she can fall asleep at any time.  She denies any other fatigue, lymphadenopathy, night sweats, unitnentional weight loss. She is without fevers or chills.    Sandra Merritt is on Coumadin for her A Fib.  She tolerates this well.  She was recently on Lovenox due to developing a small clot in her heart while she was off of her Coumadin in preparation for TEE and cardiac catheterization.     REVIEW OF SYSTEMS:  Sandra Merritt had no vision changes or unusual headaches.  She is without nausea, vomiting, dysphagia, indigestion, bowel or bladder changes.  She denies chest pain, palpitations, cough, shortness of breath.  Sandra Merritt tells me she is independent in her ADLs, and her daughters or Sandra Merritt will cook and do the cleaning and shopping.   A detailed ROS was otherwise non contributory.    PAST MEDICAL HISTORY: Past Medical History:  Diagnosis Date  . Aortic stenosis    mild by echo 2017  . CKD (chronic kidney disease), stage III ([place])   . Colon polyps   . COPD (chronic obstructive  pulmonary disease) ([place])   . Dyslipidemia   . Hypertension   . Hypothyroid   . LFTs abnormal    History of abnormal LFTs due to Fatty Liver  . Macular degeneration   . Memory loss   . Mitral regurgitation    mdoerate by echo 07/2015  . Obesity   . OSA (obstructive sleep apnea)    intolerant to CPAP  . Osteoarthritis   . Osteopenia   . Permanent atrial fibrillation    Chronic on coumadin  . Pulmonary HTN ([place])    moderate with PASP 99mHg on echo 07/2015  . Right sided facial pain   . Vitamin D deficiency     PAST SURGICAL HISTORY: Past Surgical History:  Procedure Laterality Date  . BREAST BIOPSY    . CHOLECYSTECTOMY    . KNEE SURGERY Right   . RIGHT HEART CATH N/A 12/15/2016   Procedure: Right Heart Cath;  Surgeon: DLarey Dresser MD;  [facility];  Service: Cardiovascular;  Laterality: N/A;  . RIGHT/LEFT HEART CATH AND CORONARY ANGIOGRAPHY N/A 11/11/2018   Procedure: RIGHT/LEFT HEART CATH AND CORONARY ANGIOGRAPHY;  Surgeon: MLarey Dresser MD;  [facility];  Service: Cardiovascular;  Laterality: N/A;  . TEE WITHOUT CARDIOVERSION N/A 11/11/2018   Procedure: TRANSESOPHAGEAL ECHOCARDIOGRAM (TEE);  Surgeon: MLarey Dresser MD;  [facility];  Service: Cardiovascular;  Laterality: N/A;  CATH AFTER PROCEDURE    FAMILY HISTORY Family  History  Problem Relation Age of Onset  . Heart disease Mother   . Heart disease Father   . Heart disease Sister   . Diabetes Sister     GYNECOLOGIC HISTORY:  Post menopausal  SOCIAL HISTORY:  Widowed, lives with her daughters and Calton Golds her caregiver.  She denies tobacco, ETOH, or drug use.     ADVANCED DIRECTIVES: Her daughter Elizabeth Palau is her Chauncey Reading [phone]   HEALTH MAINTENANCE: Social History   Tobacco Use  . Smoking status: Never Smoker  . Smokeless tobacco: Never Used  Substance Use Topics  . Alcohol use: No  . Drug use: No     Colonoscopy:  PAP:  Bone  density:   Allergies  Allergen Reactions  . Milk-Related Compounds Other (See Comments)    cough  . Penicillins Hives and Rash    Did it involve swelling of the face/tongue/throat, SOB, or low BP? No Did it involve sudden or severe rash/hives, skin peeling, or any reaction on the inside of your mouth or nose? No Did you need to seek medical attention at a hospital or doctor's office? No When did it last happen?years If all above answers are "NO", may proceed with cephalosporin use.     Current Outpatient Medications  Medication Sig Dispense Refill  . acetaminophen (TYLENOL) 500 MG tablet Take 1,000 mg by mouth 3 (three) times daily.     Marland Kitchen alendronate (FOSAMAX) 70 MG tablet Take 70 mg by mouth every Wednesday.   11  . Calcium Carb-Cholecalciferol (CALCIUM + D3) 600-200 MG-UNIT TABS Take 1 tablet by mouth 2 (two) times daily.     Marland Kitchen enoxaparin (LOVENOX) 100 MG/ML injection Inject 1 mL (100 mg total) into the skin 2 (two) times daily. 10 Syringe 0  . furosemide (LASIX) 40 MG tablet Take 2 tablets (80 mg total) by mouth every morning AND 1 tablet (40 mg total) every evening. (Patient taking differently: Take 2 tablets (80 mg total) by mouth twice a day.) 270 tablet 3  . gabapentin (NEURONTIN) 100 MG capsule Take 100 mg by mouth 3 (three) times daily.     . Glucosamine HCl (GLUCOSAMINE MAXIMUM STRENGTH PO) Take 1-2 tablets by mouth See admin instructions. Take 2 tablets in the morning and 1 tablet in the evening    . levothyroxine (SYNTHROID, LEVOTHROID) 150 MCG tablet Take 150 mcg by mouth daily before breakfast.    . Lidocaine 4 % PTCH Apply 1 patch topically daily as needed (pain).    Marland Kitchen loratadine (CLARITIN) 10 MG tablet Take 10 mg by mouth daily as needed for allergies.    Marland Kitchen lovastatin (MEVACOR) 40 MG tablet Take 40 mg by mouth at bedtime.    . Menthol, Topical Analgesic, (ICY HOT EX) Apply 1 application topically daily as needed (pain).    . metoprolol succinate (TOPROL-XL) 25 MG 24  hr tablet Take 25 mg by mouth daily.    . Multiple Vitamin (MULTIVITAMIN) tablet Take 1 tablet by mouth at bedtime.     . Multiple Vitamins-Minerals (EYE VITAMINS PO) Take 1 capsule by mouth 2 (two) times daily.     . Polyvinyl Alcohol-Povidone (REFRESH OP) Place 1 drop into both eyes daily as needed (dry eyes).     . ramipril (ALTACE) 2.5 MG capsule Take 1 capsule (2.5 mg total) by mouth daily. 90 capsule 1  . warfarin (COUMADIN) 5 MG tablet Take 7.5 mg (1 and 1 half tab) 3/12, and then 5 mg (1 tab) daily until coumadin check.  90 tablet 1   No current facility-administered medications for this visit.     OBJECTIVE:  Vitals:   11/17/18 1122  BP: (!) 106/42  Pulse: 63  Resp: 17  Temp: 97.8 F (36.6 C)  SpO2: 98%     Body mass index is 35.65 kg/m.   Wt Readings from Last 3 Encounters:  11/17/18 207 lb 11.2 oz (94.2 kg)  11/11/18 210 lb 9.6 oz (95.5 kg)  11/02/18 210 lb 9.6 oz (95.5 kg)  ECOG FS:1 - Symptomatic but completely ambulatory GENERAL: Patient is a well appearing female in no acute distress HEENT:  Sclerae anicteric.  Oropharynx clear and moist. No ulcerations or evidence of oropharyngeal candidiasis. Neck is supple.  NODES:  No cervical, supraclavicular, or axillary lymphadenopathy palpated.  LUNGS:  Clear to auscultation bilaterally.  No wheezes or rhonchi. HEART:  Regular rate and rhythm. No murmur appreciated. ABDOMEN:  Soft, nontender.  Positive, normoactive bowel sounds. No organomegaly palpated. MSK:  No focal spinal tenderness to palpation. Full range of motion bilaterally in the upper extremities. EXTREMITIES:  No peripheral edema.   SKIN:  Clear with no obvious rashes or skin changes. No nail dyscrasia. NEURO:  Nonfocal. Well oriented.  Appropriate affect.    LAB RESULTS:  CMP     Component Value Date/Time   NA 141 11/17/2018 1048   NA 142 09/22/2016 1408   K 4.1 11/17/2018 1048   CL 101 11/17/2018 1048   CO2 29 11/17/2018 1048   GLUCOSE 94  11/17/2018 1048   BUN 21 11/17/2018 1048   BUN 15 09/22/2016 1408   CREATININE 1.10 (H) 11/17/2018 1048   CREATININE 1.12 (H) 09/18/2015 1152   CALCIUM 9.3 11/17/2018 1048   PROT 5.9 (L) 11/17/2018 1048   ALBUMIN 3.5 11/17/2018 1048   AST 37 11/17/2018 1048   ALT 24 11/17/2018 1048   ALKPHOS 143 (H) 11/17/2018 1048   BILITOT 1.1 11/17/2018 1048   GFRNONAA 46 (L) 11/17/2018 1048   GFRAA 53 (L) 11/17/2018 1048    No results found for: TOTALPROTELP, ALBUMINELP, A1GS, A2GS, BETS, BETA2SER, GAMS, MSPIKE, SPEI  No results found for: KPAFRELGTCHN, LAMBDASER, KAPLAMBRATIO  Lab Results  Component Value Date   WBC 3.3 (L) 11/17/2018   NEUTROABS 1.9 11/17/2018   HGB 10.6 (L) 11/17/2018   HCT 33.1 (L) 11/17/2018   MCV 110.7 (H) 11/17/2018   PLT 76 (L) 11/17/2018      Chemistry      Component Value Date/Time   NA 141 11/17/2018 1048   NA 142 09/22/2016 1408   K 4.1 11/17/2018 1048   CL 101 11/17/2018 1048   CO2 29 11/17/2018 1048   BUN 21 11/17/2018 1048   BUN 15 09/22/2016 1408   CREATININE 1.10 (H) 11/17/2018 1048   CREATININE 1.12 (H) 09/18/2015 1152      Component Value Date/Time   CALCIUM 9.3 11/17/2018 1048   ALKPHOS 143 (H) 11/17/2018 1048   AST 37 11/17/2018 1048   ALT 24 11/17/2018 1048   BILITOT 1.1 11/17/2018 1048       No results found for: LABCA2  No components found for: [id number]  Recent Labs  Lab 11/16/18 0941  INR 2.9    No results found for: LABCA2  No results found for: GBM211  No results found for: DBZ208  No results found for: YEM336  No results found for: CA2729  No components found for: HGQUANT  No results found for: CEA1 / No results found for: CEA1   No  results found for: AFPTUMOR  No results found for: CHROMOGRNA  No results found for: PSA1  Appointment on 11/17/2018  Component Date Value Ref Range Status  . WBC Count 11/17/2018 3.3* 4.0 - 10.5 K/uL Final  . RBC 11/17/2018 2.99* 3.87 - 5.11 MIL/uL Final  . Hemoglobin  11/17/2018 10.6* 12.0 - 15.0 g/dL Final  . HCT 11/17/2018 33.1* 36.0 - 46.0 % Final  . MCV 11/17/2018 110.7* 80.0 - 100.0 fL Final  . MCH 11/17/2018 35.5* 26.0 - 34.0 pg Final  . MCHC 11/17/2018 32.0  30.0 - 36.0 g/dL Final  . RDW 11/17/2018 15.6* 11.5 - 15.5 % Final  . Platelet Count 11/17/2018 76* 150 - 400 K/uL Final  . nRBC 11/17/2018 0.0  0.0 - 0.2 % Final  . Neutrophils Relative % 11/17/2018 57  % Final  . Neutro Abs 11/17/2018 1.9  1.7 - 7.7 K/uL Final  . Lymphocytes Relative 11/17/2018 25  % Final  . Lymphs Abs 11/17/2018 0.8  0.7 - 4.0 K/uL Final  . Monocytes Relative 11/17/2018 16  % Final  . Monocytes Absolute 11/17/2018 0.5  0.1 - 1.0 K/uL Final  . Eosinophils Relative 11/17/2018 1  % Final  . Eosinophils Absolute 11/17/2018 0.0  0.0 - 0.5 K/uL Final  . Basophils Relative 11/17/2018 1  % Final  . Basophils Absolute 11/17/2018 0.0  0.0 - 0.1 K/uL Final  . Immature Granulocytes 11/17/2018 0  % Final  . Abs Immature Granulocytes 11/17/2018 0.01  0.00 - 0.07 K/uL Final   Performed at [facility], [place] [address] [place], [place] [address]  . Sodium 11/17/2018 141  135 - 145 mmol/L Final  . Potassium 11/17/2018 4.1  3.5 - 5.1 mmol/L Final  . Chloride 11/17/2018 101  98 - 111 mmol/L Final  . CO2 11/17/2018 29  22 - 32 mmol/L Final  . Glucose, Bld 11/17/2018 94  70 - 99 mg/dL Final  . BUN 11/17/2018 21  8 - 23 mg/dL Final  . Creatinine 11/17/2018 1.10* 0.44 - 1.00 mg/dL Final  . Calcium 11/17/2018 9.3  8.9 - 10.3 mg/dL Final  . Total Protein 11/17/2018 5.9* 6.5 - 8.1 g/dL Final  . Albumin 11/17/2018 3.5  3.5 - 5.0 g/dL Final  . AST 11/17/2018 37  15 - 41 U/L Final  . ALT 11/17/2018 24  0 - 44 U/L Final  . Alkaline Phosphatase 11/17/2018 143* 38 - 126 U/L Final  . Total Bilirubin 11/17/2018 1.1  0.3 - 1.2 mg/dL Final  . GFR, Est Non Af Am 11/17/2018 46* >60 mL/min Final  . GFR, Est AFR Am 11/17/2018 53* >60 mL/min Final  . Anion gap 11/17/2018 11  5  - 15 Final   Performed at [facility], [place] [address] [place], [place] [address]  . Retic Ct Pct 11/17/2018 1.5  0.4 - 3.1 % Final  . RBC. 11/17/2018 2.99* 3.87 - 5.11 MIL/uL Final  . Retic Count, Absolute 11/17/2018 44.0  19.0 - 186.0 K/uL Final  . Immature Retic Fract 11/17/2018 14.3  2.3 - 15.9 % Final   Performed at [facility], [place] [address], [place], [place] [address]  . Smear Review 11/17/2018 SMEAR STAINED AND AVAILABLE FOR REVIEW   Final   Performed at [facility], [address] [place],  [address]  . Folate 11/17/2018 48.9  >5.9 ng/mL Final   Comment: RESULTS CONFIRMED BY MANUAL DILUTION Performed at [facility]  [place] [address], [place], [place] [address]   . Vitamin B-12 11/17/2018 1,106* 180 - 914 pg/mL Final   Comment: (NOTE) This assay is not validated for testing neonatal or myeloproliferative syndrome specimens for Vitamin B12 levels. Performed at [facility], [place] [address] [place], [place] [address]   Anti-coag visit on 11/16/2018  Component Date Value Ref Range Status  . INR 11/16/2018 2.9  2.0 - 3.0 Final    (this displays the last labs from the last 3 days)  No results found for: TOTALPROTELP, ALBUMINELP, A1GS, A2GS, BETS, BETA2SER, GAMS, MSPIKE, SPEI (this displays SPEP labs)  No results found for: KPAFRELGTCHN, LAMBDASER, KAPLAMBRATIO (kappa/lambda light chains)  No results found for: HGBA, HGBA2QUANT, HGBFQUANT, HGBSQUAN (Hemoglobinopathy evaluation)   No results found for: LDH  No results found for: IRON, TIBC, IRONPCTSAT (Iron and TIBC)  No results found for: FERRITIN  Urinalysis No results found for: COLORURINE, APPEARANCEUR, LABSPEC, PHURINE, GLUCOSEU, HGBUR, BILIRUBINUR, KETONESUR, PROTEINUR, UROBILINOGEN, NITRITE, LEUKOCYTESUR  ASSESSMENT: 85 y.o. woman with worsening pancytopenia questonably due to myelodysplasia.    1. Bone Marrow Biopsy by CT guidance in 6-8 weeks with cytogenetics   PLAN:  Jahniah's situation was reviewed with her and her caregiver Sandra Merritt in detail by Dr. Jana Hakim.  She has decreased WBC, hemoglobin, and platelets.  Dr. Jana Hakim and I reviewed her blood smear, which does not show any increased blasts, but the RBCs are slightly larger than usual.  Due to all of her blood cell lines being decreased, she will need a bone marrow biopsy to evaluate what exactly is going on.    Due to the issues with her aortic stenosis and coronavirus, we will hold off on scheduling the bone marrow biopsy right now.  Dr. Jana Hakim reviewed what myelodysplasia is briefly since we need to rule that out  He notes that she can undergo the bone marrow biopsy in radiology in 6-8 weeks, after she meets with Dr. Burt Knack and gets her heart valve issues straightened out.    He will see her in 3 months for lab and f/u to review.  She knows to call for any questions or concerns prior to her next appointment with [place].     Scot Dock, NP   11/17/2018 2:46 PM Medical Oncology and Hematology [facility] [address] [place], [place] [address] Tel. [phone]    Fax. [phone]   ADDENDUM: I discussed with the patient that when we have all 3 cell lines involved, and when we have macrocytosis, which can be a sign of DNA abnormalities) bone marrow biopsy becomes necessary.  This is going to let us know whether 1 or more cell lines are dysplastic, and whether there are chromosomal abnormalities which can be helpful.  If the bone marrow is nondiagnostic the differential remains broad and includes medication, autoimmune disease, copper deficiency, and other less common conditions, but step 1 is a bone marrow biopsy and as noted by my 11 assistant in the absence of any urgency we are going to wait until the patient's cardiac situation has been clarified  The patient has a good understanding of this  plan.  She agrees with it.  She knows to call for any other issues that may develop before the next visit.  I personally saw this patient and performed a substantive portion of this encounter with the listed APP documented above.   Chauncey Cruel, MD Medical Oncology and Hematology [facility] [place],  [place] [address] Tel. [phone]    Fax. [phone]

## 2018-11-18 LAB — PROTEIN ELECTROPHORESIS, SERUM, WITH REFLEX
A/G Ratio: 1.7 (ref 0.7–1.7)
Albumin ELP: 3.5 g/dL (ref 2.9–4.4)
Alpha-1-Globulin: 0.3 g/dL (ref 0.0–0.4)
Alpha-2-Globulin: 0.5 g/dL (ref 0.4–1.0)
Beta Globulin: 0.7 g/dL (ref 0.7–1.3)
Gamma Globulin: 0.7 g/dL (ref 0.4–1.8)
Globulin, Total: 2.1 g/dL — ABNORMAL LOW (ref 2.2–3.9)
Total Protein ELP: 5.6 g/dL — ABNORMAL LOW (ref 6.0–8.5)

## 2018-11-21 ENCOUNTER — Encounter: Payer: Self-pay | Admitting: Cardiovascular Disease

## 2018-11-21 NOTE — Progress Notes (Signed)
Chart reviewed. Complex patient with NYHA III chronic diastolic heart failure. Now confirmed severe AS by TEE. The patient has pancytopenia with plans for bone marrow biopsy once her aortic stenosis is addressed. Considering her advanced age and comorbid conditions, I think her evaluation and testing should be delayed until the Covid-19 restrictions are lifted. If her symptoms become progressive please ask her to contact us and she can be evaluated sooner if need arises.

## 2018-11-22 NOTE — Progress Notes (Signed)
  [facility]   MULTIDISCIPLINARY HEART VALVE TEAM  I spoke with the pt and her caregiver and advised that due to increased risk of Covid-19 exposure we are recommending that her TAVR consult with Dr Burt Knack on 3/26 be rescheduled to 12/24/18.  Both caregiver and pt agree with plan. I advised the pt that at any time if she developed symptoms of new or worsening SOB, CP (tightness), swelling, dizziness and syncope that she should call [facility] at [phone] for advisement or call 911.  Pt and caregiver agreed with plan.

## 2018-11-23 ENCOUNTER — Encounter (HOSPITAL_COMMUNITY): Payer: Medicare HMO | Admitting: Cardiology

## 2018-11-23 ENCOUNTER — Telehealth: Payer: Self-pay

## 2018-11-23 NOTE — Telephone Encounter (Signed)

## 2018-11-24 ENCOUNTER — Encounter (HOSPITAL_COMMUNITY): Payer: Self-pay

## 2018-11-24 ENCOUNTER — Other Ambulatory Visit: Payer: Self-pay

## 2018-11-24 ENCOUNTER — Ambulatory Visit (HOSPITAL_COMMUNITY)
Admission: RE | Admit: 2018-11-24 | Discharge: 2018-11-24 | Disposition: A | Discharge status: Home / Self Care | Payer: Medicare HMO | Source: Ambulatory Visit | Attending: Cardiology | Admitting: Cardiology

## 2018-11-24 DIAGNOSIS — I350 Nonrheumatic aortic (valve) stenosis: Secondary | ICD-10-CM

## 2018-11-24 DIAGNOSIS — I5032 Chronic diastolic (congestive) heart failure: Secondary | ICD-10-CM | POA: Diagnosis not present

## 2018-11-24 NOTE — Patient Instructions (Signed)
Please follow up with Dr. Aundra Dubin in 6 weeks

## 2018-11-24 NOTE — Progress Notes (Signed)
Heart Failure TeleHealth Note  Due to national recommendations of social distancing due to [place] 19, Audio telehealth visit is felt to be most appropriate for this patient at this time.  See MyChart message from today for patient consent regarding telehealth for [facility].  Date:  11/24/2018   ID:  Sandra Merritt, DOB 29-Nov-1931, MRN [id number]  Location: Home  Provider location: [address], [place] Type of Visit: Established patient  PCP:  Jonathon Jordan, MD  Cardiologist:  Loralie Champagne, MD Primary HF: [place]  Chief Complaint:  Shortness of breath   History of Present Illness: Sandra Merritt is a 85 y.o. female who presents via audio conferencing for a telehealth visit today.     84 y.o. with history of permanent atrial fibrillation and diastolic CHF.  She has been followed by Dr. Radford Pax. She has had atrial fibrillation since 2002, cardioversion was unsuccessful at that time.  She has done reasonably well over the years, but more recently developed progressive exercise intolerance.  She is limited by knee arthritis and low back pain, but clearly became more short of breath over the last 6 months. She is not very active.  When I saw her initially, she was short of breath walking up any stairs. She was short of breath dressing.  She was short of breath walking longer distances around her house.  No orthopnea/PND.  No chest pain.    I increased her Lasix. RHC in 4/18 showed elevated right and left heart filling pressures and pulmonary venous hypertension.  Weight dropped 20 lbs and I backed off on her Lasix to 40 qam/20 qpm alternating with 40 mg bid with rise in BUN/creatinine.    Echo 2/19 EF 55-60%, moderate LVH, PASP 65 mmHg, moderate AS with mean gradient 23 mmHg, severe biatrial enlargement.   Echo was done in 2/20 showing EF 60-65%, moderate LVH, normal RV, severe TR, moderate MR, suspected paradoxical low flow/low gradient aortic stenosis with mean gradient 25  mmHg/AVA 0.83 cm^2, mild AI.  Given concern for severe AS, she had a TEE in 3/20 that confirmed severe AS.  RHC/LHC showed no significant coronary disease and mildly elevated filling pressures with pulmonary venous hypertension.  After cath, I increased her Lasix to 80 mg bid.   She is stable clinically. No dyspnea walking around her house.  Thinks breathing may be mildly better since increasing Lasix.  No orthopnea/PND, no lightheadedness, no chest pain.  Weight is down 1 lb at home. BP today 106/51 (checked by daughter).  Recent labs stable. She is now off Lovenox, was bridged back to therapeutic INR after TEE/cath given LA appendage thrombus seen on TEE.    she denies symptoms of cough, fevers, chills, or new SOB worrisome for COVID 19.    Labs (3/20): WBCs 3.3, hgb 10.6, plts 76, K 4.1, creatinine 1.1  PMH: 1. Atrial fibrillation: Permanent. Present since 2002.  - LA appendage thrombus noted at time of TEE in 3/20 (had been off warfarin for TEE).  2. H/o cholecystectomy  3. Hypothyroidism 4. HTN 5. Hyperlipidemia 6. OSA: Cannot tolerate CPAP.  7. CKD stage III 8. Chronic diastolic CHF: Echo (5/88) with EF 50-55%, moderate to severe LVH, moderate AS (AVA 1.0 cm^2, mean gradient 21 mmHg), mild AI, mild RV dilation with moderately decreased RV systolic function, moderate to severe TR, PASP 69 mmHg, moderate mitral regurgitation. PYP scan (12/19) with H:CL 1.46, grade 1 visual => unlikely to be TTR amyloidosis.  -  RHC (4/18): mean RA 14, PA 61/24 mean 41, mean PCWP 28, CI 3.48, PVR 1.8 WU.  - Echo (2/19): EF 55-60%, moderate LVH, mildly dilated RV with normal systolic function, PASP 65 mmHg, moderate AS with mean gradient 23 mmHg and AVA 1.2 cm^2, mild to moderate MR, severe biatrial enlargement, dilated IVC.  - Echo (2/20): EF 60-65%, moderate LVH, normal RV, severe TR, moderate MR, suspected paradoxical low flow/low gradient aortic stenosis with mean gradient 25 mmHg/AVA 0.83 cm^2, mild AI.  -  TEE (3/20): EF 55-60%, severe AS, LA appendage thrombus, mild RV dilation with normal function.  - LHC/RHC (3/20): No significant CAD; mean RA 9, PA 60/21 mean 38, mean PCWP 21 with prominent v waves, CI 4.57, PVR 1.85 WU.  9. Aortic stenosis: Echo in 2/20 concerning for paradoxical low flow/low gradient severe aortic stenosis.  - TEE (3/20): severe AS with mean gradient 47 mmHg, AVA 0.5 cm^2 by planimetry and 0.6 cm^2 by continuity.  10. Pancytopenia: ?myelodysplastic syndrome.  Hematology following.   Current Outpatient Medications  Medication Sig Dispense Refill  . acetaminophen (TYLENOL) 500 MG tablet Take 1,000 mg by mouth 3 (three) times daily.     Marland Kitchen alendronate (FOSAMAX) 70 MG tablet Take 70 mg by mouth every Wednesday.   11  . Calcium Carb-Cholecalciferol (CALCIUM + D3) 600-200 MG-UNIT TABS Take 1 tablet by mouth 2 (two) times daily.     Marland Kitchen enoxaparin (LOVENOX) 100 MG/ML injection Inject 1 mL (100 mg total) into the skin 2 (two) times daily. 10 Syringe 0  . furosemide (LASIX) 40 MG tablet Take 2 tablets (80 mg total) by mouth every morning AND 1 tablet (40 mg total) every evening. (Patient taking differently: Take 2 tablets (80 mg total) by mouth twice a day.) 270 tablet 3  . gabapentin (NEURONTIN) 100 MG capsule Take 100 mg by mouth 3 (three) times daily.     . Glucosamine HCl (GLUCOSAMINE MAXIMUM STRENGTH PO) Take 1-2 tablets by mouth See admin instructions. Take 2 tablets in the morning and 1 tablet in the evening    . levothyroxine (SYNTHROID, LEVOTHROID) 150 MCG tablet Take 150 mcg by mouth daily before breakfast.    . Lidocaine 4 % PTCH Apply 1 patch topically daily as needed (pain).    Marland Kitchen loratadine (CLARITIN) 10 MG tablet Take 10 mg by mouth daily as needed for allergies.    Marland Kitchen lovastatin (MEVACOR) 40 MG tablet Take 40 mg by mouth at bedtime.    . Menthol, Topical Analgesic, (ICY HOT EX) Apply 1 application topically daily as needed (pain).    . metoprolol succinate (TOPROL-XL) 25 MG  24 hr tablet Take 25 mg by mouth daily.    . Multiple Vitamin (MULTIVITAMIN) tablet Take 1 tablet by mouth at bedtime.     . Multiple Vitamins-Minerals (EYE VITAMINS PO) Take 1 capsule by mouth 2 (two) times daily.     . Polyvinyl Alcohol-Povidone (REFRESH OP) Place 1 drop into both eyes daily as needed (dry eyes).     . ramipril (ALTACE) 2.5 MG capsule Take 1 capsule (2.5 mg total) by mouth daily. 90 capsule 1  . warfarin (COUMADIN) 5 MG tablet Take 7.5 mg (1 and 1 half tab) 3/12, and then 5 mg (1 tab) daily until coumadin check. 90 tablet 1   No current facility-administered medications for this encounter.     Allergies:   Milk-related compounds and Penicillins   Social History:  The patient  reports that she has never smoked. She  has never used smokeless tobacco. She reports that she does not drink alcohol or use drugs.   Family History:  The patient's family history includes Diabetes in her sister; Heart disease in her father, mother, and sister.   ROS:  Please see the history of present illness.   All other systems are personally reviewed and negative.   Exam:  (Tele Health Call; Exam is subjective and or/visual.) BP 106/51, weight 205 General:  Well appearing. No resp difficulty. Lungs: Normal respiratory effort with conversation.  Abdomen: Non-distended. Pt denies tenderness with self palpation.  Extremities: Pt denies edema. Neuro: Alert & oriented x 3.   Recent Labs: 11/17/2018: ALT 24; BUN 21; Creatinine 1.10; Hemoglobin 10.6; Platelet Count 76; Potassium 4.1; Sodium 141  Personally reviewed   Wt Readings from Last 3 Encounters:  11/17/18 94.2 kg (207 lb 11.2 oz)  11/11/18 95.5 kg (210 lb 9.6 oz)  11/02/18 95.5 kg (210 lb 9.6 oz)     ASSESSMENT AND PLAN:  1. Atrial fibrillation: Permanent. She is on warfarin with no problems. Rate controlled with Toprol XL. - Continue warfarin. Recent CBC stable (mild anemia).   2. Chronic diastolic CHF: With prominent RV failure. [place]  in 4/18 showed pulmonary venous hypertension with elevated left and right heart filling pressures.  PYP scan in 12/19 was not suggestive of TTR amyloidosis.  NYHA class II-III symptoms, chronic. Weight is down some at home after recent increase in Lasix at time of cath, now take 80 mg bid.  Recent labs (after increasing Lasix) showed stable K and creatinine.    - Continue Lasix 80 mg bid.  3. Aortic stenosis: Severe aortic stenosis confirmed by TEE.  She has had LHC/RHC.  I have referred her to Dr. Burt Knack for TAVR, evaluation currently on hold due to coronavirus. Will hopefully be able to proceed in a few weeks, discussed with patient and family.  4. Pulmonary hypertension: She has group 2 PH due to LV diastolic dysfunction/elevated LA pressure (pulmonary venous hypertension).   - Continue lasix as above.  5. Tricuspid regurgitation: Moderate on 3/20 TEE (Severe in past).   COVID screen The patient does not have any symptoms that suggest any further testing/ screening at this time.  Social distancing reinforced today.  Recommended follow-up:  6 wks  Relevant cardiac medications were reviewed at length with the patient today.   The patient does not have concerns regarding their medications at this time.   Patient Risk: After full review of this patients clinical status, I feel that they are at moderate risk for cardiac decompensation at this time.  Today, I have spent 16 minutes with the patient with telehealth technology discussing aortic stenosis and CHF.    Signed, Loralie Champagne, MD  11/24/2018 1:35 PM  [facility] [address] [facility] [place] [address] [phone] (office) [phone] (fax)

## 2018-11-25 ENCOUNTER — Other Ambulatory Visit: Payer: Self-pay

## 2018-11-25 ENCOUNTER — Ambulatory Visit (INDEPENDENT_AMBULATORY_CARE_PROVIDER_SITE_OTHER): Payer: Medicare HMO | Admitting: Pharmacist

## 2018-11-25 ENCOUNTER — Institutional Professional Consult (permissible substitution): Payer: Medicare HMO | Admitting: Cardiovascular Disease

## 2018-11-25 DIAGNOSIS — Z5181 Encounter for therapeutic drug level monitoring: Secondary | ICD-10-CM | POA: Diagnosis not present

## 2018-11-25 DIAGNOSIS — I4821 Permanent atrial fibrillation: Secondary | ICD-10-CM | POA: Diagnosis not present

## 2018-11-25 LAB — POCT INR: INR: 1.9 — AB (ref 2.0–3.0)

## 2018-12-22 ENCOUNTER — Telehealth: Payer: Self-pay

## 2018-12-22 NOTE — Telephone Encounter (Signed)
Spoke with the patient, who agrees to video visit consult with Dr. Burt Knack this Friday. Discussed with the patient, her friend, and her daughter. Doximity call was practiced and completed. They understand to have VS and medications ready for review Friday morning prior to Dr. Antionette Char visit. They were grateful for assistance.  Consent obtained and documented.   Confirm consent - "In the setting of the current Covid19 crisis, you are scheduled for a (phone or video) visit with your provider on (date) at (time).  Just as we do with many in-office visits, in order for you to participate in this visit, we must obtain consent.  If you'd like, I can send this to your mychart (if signed up) or email for you to review.  Otherwise, I can obtain your verbal consent now.  All virtual visits are billed to your insurance company just like a normal visit would be.  By agreeing to a virtual visit, we'd like you to understand that the technology does not allow for your provider to perform an examination, and thus may limit your provider's ability to fully assess your condition. If your provider identifies any concerns that need to be evaluated in person, we will make arrangements to do so.  Finally, though the technology is pretty good, we cannot assure that it will always work on either your or our end, and in the setting of a video visit, we may have to convert it to a phone-only visit.  In either situation, we cannot ensure that we have a secure connection.  Are you willing to proceed?" STAFF: Did the patient verbally acknowledge consent to telehealth visit? Document YES/NO here: YES    TELEPHONE CALL NOTE  Sandra Merritt has been deemed a candidate for a follow-up tele-health visit to limit community exposure during the Covid-19 pandemic. I spoke with the patient via phone to ensure availability of phone/video source, confirm preferred email & phone number, and discuss instructions and expectations.  I reminded Sandra Merritt to be prepared with any vital sign and/or heart rhythm information that could potentially be obtained via home monitoring, at the time of her visit. I reminded Sandra Merritt to expect a phone call prior to her visit.  Theodoro Parma, RN 12/22/2018 1:17 PM      FULL LENGTH CONSENT FOR TELE-HEALTH VISIT   I hereby voluntarily request, consent and authorize [facility] and its employed or contracted physicians, physician assistants, nurse practitioners or other licensed health care professionals (the Practitioner), to provide me with telemedicine health care services (the Services") as deemed necessary by the treating Practitioner. I acknowledge and consent to receive the Services by the Practitioner via telemedicine. I understand that the telemedicine visit will involve communicating with the Practitioner through live audiovisual communication technology and the disclosure of certain medical information by electronic transmission. I acknowledge that I have been given the opportunity to request an in-person assessment or other available alternative prior to the telemedicine visit and am voluntarily participating in the telemedicine visit.  I understand that I have the right to withhold or withdraw my consent to the use of telemedicine in the course of my care at any time, without affecting my right to future care or treatment, and that the Practitioner or I may terminate the telemedicine visit at any time. I understand that I have the right to inspect all information obtained and/or recorded in the course of the telemedicine visit and may receive copies of available information for a reasonable  fee.  I understand that some of the potential risks of receiving the Services via telemedicine include:   Delay or interruption in medical evaluation due to technological equipment failure or disruption;  Information transmitted may not be sufficient (e.g. poor resolution of images) to allow for  appropriate medical decision making by the Practitioner; and/or   In rare instances, security protocols could fail, causing a breach of personal health information.  Furthermore, I acknowledge that it is my responsibility to provide information about my medical history, conditions and care that is complete and accurate to the best of my ability. I acknowledge that Practitioner's advice, recommendations, and/or decision may be based on factors not within their control, such as incomplete or inaccurate data provided by me or distortions of diagnostic images or specimens that may result from electronic transmissions. I understand that the practice of medicine is not an exact science and that Practitioner makes no warranties or guarantees regarding treatment outcomes. I acknowledge that I will receive a copy of this consent concurrently upon execution via email to the email address I last provided but may also request a printed copy by calling the office of [place].    I understand that my insurance will be billed for this visit.   I have read or had this consent read to me.  I understand the contents of this consent, which adequately explains the benefits and risks of the Services being provided via telemedicine.   I have been provided ample opportunity to ask questions regarding this consent and the Services and have had my questions answered to my satisfaction.  I give my informed consent for the services to be provided through the use of telemedicine in my medical care  By participating in this telemedicine visit I agree to the above.

## 2018-12-24 ENCOUNTER — Telehealth (INDEPENDENT_AMBULATORY_CARE_PROVIDER_SITE_OTHER): Payer: Medicare HMO | Admitting: Cardiovascular Disease

## 2018-12-24 ENCOUNTER — Other Ambulatory Visit: Payer: Self-pay

## 2018-12-24 ENCOUNTER — Encounter: Payer: Self-pay | Admitting: Cardiovascular Disease

## 2018-12-24 VITALS — BP 107/50 | HR 74 | Ht 64.0 in | Wt 209.0 lb

## 2018-12-24 DIAGNOSIS — I350 Nonrheumatic aortic (valve) stenosis: Secondary | ICD-10-CM

## 2018-12-24 NOTE — Progress Notes (Signed)
Virtual Visit via Video Note   This visit type was conducted due to national recommendations for restrictions regarding the COVID-19 Pandemic (e.g. social distancing) in an effort to limit this patient's exposure and mitigate transmission in our community.  Due to her co-morbid illnesses, this patient is at least at moderate risk for complications without adequate follow up.  This format is felt to be most appropriate for this patient at this time.  All issues noted in this document were discussed and addressed.  A limited physical exam was performed with this format.  Please refer to the patient's chart for her consent to telehealth for [facility].   Evaluation Performed:  Follow-up visit  Date:  12/24/2018   ID:  Sandra Merritt, DOB [birth year], MRN [id number]  Patient Location: Home Provider Location: Home  PCP:  Jonathon Jordan, MD  Cardiologist:  Loralie Champagne, MD  Electrophysiologist:  None   Chief Complaint: Shortness of breath  History of Present Illness:    Sandra Merritt is a 85 y.o. female presenting for TAVR evaluation, referred by Dr. Aundra Dubin.  The patient does not have symptoms concerning for COVID-19 infection (fever, chills, cough, or new shortness of breath).   The patient has a longstanding history of permanent atrial fibrillation and diastolic heart failure.  She has been managed with a strategy of rate control and anticoagulation after failing cardioversion many years ago.  She has been recently seen in the advanced heart failure clinic over the past 2 years and has been treated with diuretic therapy.  Right heart catheterization back in 2018 demonstrated elevated right and left heart filling pressures with evidence of pulmonary venous hypertension.  The patient has had known aortic stenosis, moderate in the past but more recently suggestive of low flow low gradient paradoxical severe aortic stenosis.  Confirmatory transesophageal echo was performed in March 2020  demonstrating higher transaortic gradients with a mean gradient of 47 mmHg and calculated valve area of 0.6 cm.  Cardiac catheterization demonstrated no obstructive CAD.  She's been told of a heart murmur for many years, and has been documented to have moderate aortic stenosis since approximately 2018. She is interviewed via Tax [employer] today and her family is present for the discussion. The patient reports exertional dyspnea with stable symptoms over the past several months. She denies orthopnea, PND, lightheadedness, syncope, or chest pain. She reports advanced arthritis with back and leg pain and ambulates with a walker.  She otherwise is functionally independent with good family support.  Past Medical History:  Diagnosis Date   Aortic stenosis    mild by echo 2017   CKD (chronic kidney disease), stage III (HCC)    Colon polyps    COPD (chronic obstructive pulmonary disease) ([place])    Dyslipidemia    Hypertension    Hypothyroid    LFTs abnormal    History of abnormal LFTs due to Fatty Liver   Macular degeneration    Memory loss    Mitral regurgitation    mdoerate by echo 07/2015   Obesity    OSA (obstructive sleep apnea)    intolerant to CPAP   Osteoarthritis    Osteopenia    Permanent atrial fibrillation    Chronic on coumadin   Pulmonary HTN (HCC)    moderate with PASP 78mmHg on echo 07/2015   Right sided facial pain    Vitamin D deficiency    Past Surgical History:  Procedure Laterality Date   BREAST BIOPSY  CHOLECYSTECTOMY     KNEE SURGERY Right    RIGHT HEART CATH N/A 12/15/2016   Procedure: Right Heart Cath;  Surgeon: Larey Dresser, MD;  Location: [facility];  Service: Cardiovascular;  Laterality: N/A;   RIGHT/LEFT HEART CATH AND CORONARY ANGIOGRAPHY N/A 11/11/2018   Procedure: RIGHT/LEFT HEART CATH AND CORONARY ANGIOGRAPHY;  Surgeon: Larey Dresser, MD;  Location: [place] [facility];  Service: Cardiovascular;   Laterality: N/A;   TEE WITHOUT CARDIOVERSION N/A 11/11/2018   Procedure: TRANSESOPHAGEAL ECHOCARDIOGRAM (TEE);  Surgeon: Larey Dresser, MD;  Location: [facility];  Service: Cardiovascular;  Laterality: N/A;  CATH AFTER PROCEDURE     Current Meds  Medication Sig   acetaminophen (TYLENOL) 500 MG tablet Take 1,000 mg by mouth 3 (three) times daily.    alendronate (FOSAMAX) 70 MG tablet Take 70 mg by mouth every Wednesday.    Calcium Carb-Cholecalciferol (CALCIUM + D3) 600-200 MG-UNIT TABS Take 1 tablet by mouth 2 (two) times daily.    furosemide (LASIX) 40 MG tablet Take 80 mg by mouth 2 (two) times daily.   gabapentin (NEURONTIN) 100 MG capsule Take 100 mg by mouth 3 (three) times daily.    Glucosamine HCl (GLUCOSAMINE MAXIMUM STRENGTH PO) Take 1-2 tablets by mouth See admin instructions. Take 2 tablets in the morning and 1 tablet in the evening   levothyroxine (SYNTHROID, LEVOTHROID) 150 MCG tablet Take 150 mcg by mouth daily before breakfast.   Lidocaine 4 % PTCH Apply 1 patch topically daily as needed (pain).   loratadine (CLARITIN) 10 MG tablet Take 10 mg by mouth daily as needed for allergies.   lovastatin (MEVACOR) 40 MG tablet Take 40 mg by mouth at bedtime.   Menthol, Topical Analgesic, (ICY HOT EX) Apply 1 application topically daily as needed (pain).   metoprolol succinate (TOPROL-XL) 25 MG 24 hr tablet Take 25 mg by mouth daily.   Multiple Vitamin (MULTIVITAMIN) tablet Take 1 tablet by mouth at bedtime.    Multiple Vitamins-Minerals (EYE VITAMINS PO) Take 1 capsule by mouth 2 (two) times daily.    Polyvinyl Alcohol-Povidone (REFRESH OP) Place 1 drop into both eyes daily as needed (dry eyes).    ramipril (ALTACE) 2.5 MG capsule Take 1 capsule (2.5 mg total) by mouth daily.   warfarin (COUMADIN) 5 MG tablet Take 7.5 mg (1 and 1 half tab) 3/12, and then 5 mg (1 tab) daily until coumadin check.     Allergies:   Milk-related compounds and Penicillins   Social  History   Tobacco Use   Smoking status: Never Smoker   Smokeless tobacco: Never Used  Substance Use Topics   Alcohol use: No   Drug use: No    Family Hx: The patient's family history includes Diabetes in her sister; Heart disease in her father, mother, and sister.  ROS:   Please see the history of present illness.    All other systems reviewed and are negative.  Prior CV studies:   The following studies were reviewed today:  Transesophageal echocardiogram 11/11/2018: IMPRESSIONS    1. Left atrial size was severely dilated. There appeared to be amorphous thrombus at the tip of the left atrial appendage.  2. Right atrial size was severely dilated.  3. Tricuspid valve regurgitation is moderate. Peak RV-RA gradient 46 mmHg.  4. The aortic valve is tricuspid Severe calcifcation of the aortic valve. Aortic valve regurgitation is trivial by color flow Doppler. severe stenosis of the aortic valve. Mean gradient across aortic valve  47 mmHg. AVA 0.5 cm^2 planimetry, 0.6 cm^2 by  Continuity equation.  5. Mitral valve regurgitation is moderate by color flow Doppler. No evidence of mitral valve stenosis.  6. The right ventricle has normal systolc function. The cavity was mildly enlarged.  7. The left ventricle has normal systolic function, with an ejection fraction of 55-60%. The cavity size was normal. There is mildly increased left ventricular wall thickness. No evidence of left ventricular regional wall motion abnormalities.  8. The aortic root and ascending aorta are normal in size and structure.  FINDINGS  Left Ventricle: The left ventricle has normal systolic function, with an ejection fraction of 55-60%. The cavity size was normal. There is mildly increased left ventricular wall thickness. No evidence of left ventricular regional wall motion  abnormalities. Right Ventricle: The right ventricle has normal systolic function. The cavity was mildly enlarged. Left Atrium: Left atrial  size was severely dilated. Right Atrium: Right atrial size was severely dilated. Right atrial pressure is estimated at 10 mmHg. Interatrial Septum: No atrial level shunt detected by color flow Doppler. Pericardium: There is no evidence of pericardial effusion. Mitral Valve: The mitral valve is normal in structure. Mitral valve regurgitation is moderate by color flow Doppler. No evidence of mitral valve stenosis. Tricuspid Valve: The tricuspid valve was normal in structure. Tricuspid valve regurgitation is moderate by color flow Doppler. Aortic Valve: The aortic valve is tricuspid Severe calcifcation of the aortic valve. Aortic valve regurgitation is trivial by color flow Doppler. There is severe stenosis of the aortic valve, with a calculated valve area of 0.63 cm. Pulmonic Valve: The pulmonic valve was normal in structure. Pulmonic valve regurgitation is trivial by color flow Doppler. Aorta: The aortic root and ascending aorta are normal in size and structure.   LV Wall Scoring:  LEFT VENTRICLE PLAX 2D LVOT diam:     2.00 cm LVOT Area:     3.14 cm  RIGHT VENTRICLE RVSP:           55.7 mmHg  RIGHT ATRIUM RA Pressure: 10 mmHg  AORTIC VALVE AV Area (Vmax):    0.61 cm AV Area (Vmean):   0.62 cm AV Area (VTI):     0.63 cm AV Vmax:           415.00 cm/s AV Vmean:          325.000 cm/s AV VTI:            1.130 m AV Peak Grad:      68.9 mmHg AV Mean Grad:      47.0 mmHg LVOT Vmax:         80.50 cm/s LVOT Vmean:        63.900 cm/s LVOT VTI:          0.227 m LVOT/AV VTI ratio: 0.20  TRICUSPID VALVE TR Peak grad:   45.7 mmHg TR Vmax:        338.00 cm/s RVSP:           55.7 mmHg   SHUNTS Systemic VTI:  0.23 m Systemic Diam: 2.00 cm  2D echocardiogram 10/13/2018: IMPRESSIONS    1. The left ventricle has normal systolic function with an ejection fraction of 60-65%. The cavity size was normal. There is moderately increased left ventricular wall thickness. Left  ventricular diastolic Doppler parameters are consistent with  restrictive filling.  2. The right ventricle has normal systolic function. The cavity was normal. There is no increase in right ventricular wall thickness.  3. Left  atrial size was severely dilated.  4. Right atrial size was severely dilated.  5. The mitral valve is degenerative. There is mild thickening and moderate calcification. Mitral valve regurgitation is moderate by color flow Doppler.  6. The tricuspid valve is normal in structure. Tricuspid valve regurgitation is severe.  7. The aortic valve is tricuspid There is severe thickening and severe calcifcation of the aortic valve, with severely decreased cusp excursion. Aortic valve regurgitation is mild by color flow Doppler. There is moderate stenosis of the aortic valve.  Peak velocity 3.28m/s and mean gradient 39mmHg.  8. The pulmonic valve was normal in structure.  9. Right atrial pressure is estimated at 15 mmHg. 10. The inferior vena cava was dilated in size with <50% respiratory variability. 11. Pulmonary hypertension is moderate.  LEFT VENTRICLE PLAX 2D (Teich) LV EF:          55.9 %   Diastology LVIDd:          4.80 cm  LV e' lateral:   14.10 cm/s LVIDs:          3.40 cm  LV E/e' lateral: 12.0 LV PW:          1.50 cm  LV e' medial:    7.40 cm/s LV IVS:         1.60 cm  LV E/e' medial:  22.8 LVOT diam:      2.00 cm LV SV:          60 ml LVOT Area:      3.14 cm  RIGHT VENTRICLE RV Basal diam:  2.30 cm RV S prime:     9.07 cm/s TAPSE (M-mode): 1.5 cm RVSP:           55.4 mmHg  LEFT ATRIUM              Index       RIGHT ATRIUM           Index LA diam:        6.20 cm  3.08 cm/m  RA Pressure: 15 mmHg LA Vol (A2C):   155.0 ml 76.95 ml/m RA Area:     28.10 cm LA Vol (A4C):   158.0 ml 78.44 ml/m RA Volume:   83.60 ml  41.50 ml/m LA Biplane Vol: 158.0 ml 78.44 ml/m  AORTIC VALVE AV Area (Vmax):    0.76 cm AV Area (Vmean):   0.71 cm AV Area (VTI):      0.83 cm AV Vmax:           335.60 cm/s AV Vmean:          238.200 cm/s AV VTI:            0.853 m AV Peak Grad:      45.1 mmHg AV Mean Grad:      26.4 mmHg LVOT Vmax:         81.13 cm/s LVOT Vmean:        54.200 cm/s LVOT VTI:          0.227 m LVOT/AV VTI ratio: 0.27 AR PHT:            543 msec   AORTA Ao Root diam: 2.70 cm  MITRAL VALVE               TRICUSPID VALVE MV Area (PHT):             TR Peak grad:   40.4 mmHg MV PHT:  TR Vmax:        354.00 cm/s MV Decel Time: 141 msec    RVSP:           55.4 mmHg MR Peak grad: 129.4 mmHg MR Mean grad: 84.7 mmHg MR Vmax:      568.67 cm/s MR Vmean:     430.0 cm/s MR PISA:      1.01 MV E velocity: 168.67 cm/s MV A velocity: 50.40 cm/s MV E/A ratio:  3.35  Cardiac catheterization 11/11/2018: Conclusion   1. Elevated left and right heart filling pressures and pulmonary venous hypertension with preserved cardiac output.  2. No significant coronary disease.    Proceed with evaluation for TAVR.   Procedural Details   Technical Details Procedure: Right Heart Cath, Selective Coronary Angiography  Indication: Severe aortic stenosis.    Procedural Details: The right brachial and radial areas were prepped, draped, and anesthetized with 1% lidocaine. There was a pre-existing peripheral IV that was replaced with a 64F venous sheath.  A Swan-Ganz catheter was used for the right heart catheterization. Standard protocol was followed for recording of right heart pressures and sampling of oxygen saturations. Fick cardiac output was calculated. The right radial artery was entered using modified Seldinger technique and a 70F sheath was placed. The patient received 3 mg IA verapamil and weight-based IV heparin. Standard Judkins catheters were used for selective coronary angiography. There were no immediate procedural complications. The patient was transferred to the post catheterization recovery area for further  monitoring.  Estimated blood loss <50 mL.   During this procedure medications were administered to achieve and maintain moderate conscious sedation while the patient's heart rate, blood pressure, and oxygen saturation were continuously monitored and I was present face-to-face 100% of this time.  Medications  (Filter: Administrations occurring from 11/11/18 1215 to 11/11/18 1312)  Medication Rate/Dose/Volume Action  Date Time   fentaNYL (SUBLIMAZE) injection (mcg) 25 mcg Given 11/11/18 1229   Total dose as of 12/24/18 1003        25 mcg        midazolam (VERSED) injection (mg) 1 mg Given 11/11/18 1229   Total dose as of 12/24/18 1003        1 mg        lidocaine (PF) (XYLOCAINE) 1 % injection (mL) 5 mL Given 11/11/18 1235   Total dose as of 12/24/18 1003        5 mL        Heparin (Porcine) in NaCl 1000-0.9 UT/500ML-% SOLN (mL) 500 mL Given 11/11/18 1235   Total dose as of 12/24/18 1003 500 mL Given 1235   1,000 mL        Radial Cocktail/Verapamil only (mL)  Given 11/11/18 1244   Total dose as of 12/24/18 1003        Cannot be calculated        heparin injection (Units) 5,000 Units Given 11/11/18 1245   Total dose as of 12/24/18 1003        5,000 Units        iohexol (OMNIPAQUE) 350 MG/ML injection (mL) 45 mL Given 11/11/18 1300   Total dose as of 12/24/18 1003        45 mL        Sedation Time   Sedation Time Physician-1: 23 minutes 58 seconds  Coronary Findings   Diagnostic  Dominance: Right  Left Main  No significant coronary disease.  Left Anterior Descending  Luminal irregularities.  Left Circumflex  No significant coronary disease.  Right Coronary Artery  No significant coronary disease.  Intervention   No interventions have been documented.  Right Heart   Right Heart Pressures RHC Procedural Findings: Hemodynamics (mmHg) RA mean 9 RV 51/7 PA 60/21, mean 38 PCWP mean 21 with prominent V waves AO 97/49  Oxygen saturations: PA 78% AO 98%  Cardiac Output  (Fick) 9.16  Cardiac Index (Fick) 4.57 PVR 1.85 WU     Labs/Other Tests and Data Reviewed:    EKG:  An ECG dated 10/27/2018 was personally reviewed today and demonstrated:  Atrial fibrillation, occasional PVC, otherwise normal  Recent Labs: 11/17/2018: ALT 24; BUN 21; Creatinine 1.10; Hemoglobin 10.6; Platelet Count 76; Potassium 4.1; Sodium 141   Recent Lipid Panel No results found for: CHOL, TRIG, HDL, CHOLHDL, LDLCALC, LDLDIRECT  Wt Readings from Last 3 Encounters:  12/24/18 209 lb (94.8 kg)  11/17/18 207 lb 11.2 oz (94.2 kg)  11/11/18 210 lb 9.6 oz (95.5 kg)     Objective:    Vital Signs:  BP (!) 107/50 (BP Location: Left Arm, Patient Position: Sitting, Cuff Size: Normal)    Pulse 74    Ht 5\' 4"  (1.626 m)    Wt 209 lb (94.8 kg)    BMI 35.87 kg/m    VITAL SIGNS:  reviewed The patient is alert, oriented, in no distress.  She is breathing comfortably with normal conversation.  Remaining exam not performed as this is a virtual visit.  STS risk calculator: Risk of Mortality:  5.422% Renal Failure:  6.184% Permanent Stroke:  2.411% Prolonged Ventilation:  12.158% DSW Infection:  0.149% Reoperation:  3.033% Morbidity or Mortality:  18.583% Short Length of Stay:  12.884% Long Length of Stay:  14.952%  ASSESSMENT & PLAN:    This is an 85 year old woman with severe stage D1 degenerative aortic stenosis with associated New York Heart Association functional class II symptoms of chronic diastolic heart failure. I have reviewed the natural history of aortic stenosis with the patient and their family members who are present today for the video conference. We have discussed the limitations of medical therapy and the poor prognosis associated with symptomatic aortic stenosis. We have reviewed potential treatment options, including palliative medical therapy, conventional surgical aortic valve replacement, and transcatheter aortic valve replacement. We discussed treatment options  in the context of the patient's specific comorbid medical conditions.   I have personally reviewed the patient's echo and cardiac catheterization studies.  She has calcification and restriction of all 3 aortic valve leaflets with hemodynamic findings on TEE that are diagnostic of severe aortic stenosis.  LV systolic function is preserved.  There is moderate mitral regurgitation and moderate to severe tricuspid regurgitation present.  Cardiac catheterization demonstrates an absence of obstructive coronary artery disease.  The patient has not undergone CT angiography studies to evaluate anatomic suitability for TAVR.  I suspect she will be a good candidate and much better suited for TAVR than conventional surgery based on her advanced age and limited functional capacity with advanced arthritis, ambulatory with a walker.  I reviewed the TAVR surgical procedure with the patient and we discussed indications, risks, and expectations around recovery.  All of her questions are answered today.  We discussed the fact that she will require CT angios of the heart as well as the chest, abdomen, and pelvis.  She will then require continued multidisciplinary approach to her care with formal cardiac surgical consultation once her CT scans are completed.  The patient understands that  we will proceed with testing and further evaluation as possible in light of the current COVID-19 pandemic.  She is somewhat reluctant to proceed with further testing and evaluation because of fears about COVID-19 exposure at this time.  She understands we will be in touch with recommendations in the near future.  COVID-19 Education: The signs and symptoms of COVID-19 were discussed with the patient and how to seek care for testing (follow up with PCP or arrange E-visit). The importance of social distancing was discussed today.  Time:   Today, I have spent 60 minutes with the patient with telehealth technology discussing the above problems.      Medication Adjustments/Labs and Tests Ordered: Current medicines are reviewed at length with the patient today.  Concerns regarding medicines are outlined above.   Tests Ordered: No orders of the defined types were placed in this encounter.   Medication Changes: No orders of the defined types were placed in this encounter.   Disposition:  Follow up As above  Signed, Sherren Mocha, MD  12/24/2018 2:38 PM    [place]

## 2018-12-28 DIAGNOSIS — E039 Hypothyroidism, unspecified: Secondary | ICD-10-CM | POA: Diagnosis not present

## 2018-12-28 DIAGNOSIS — D61818 Other pancytopenia: Secondary | ICD-10-CM | POA: Diagnosis not present

## 2018-12-29 NOTE — Progress Notes (Signed)
I contacted the pt's caregiver, Sandra Merritt, who was present during the Telemedicine visit with Dr Burt Knack to further discuss proceeding with TAVR evaluation. At this time Sandra Merritt feels like the pt's symptoms remain the same and that she is mostly limited by her arthritis.  I discussed current visitor restrictions with Sandra Merritt and she does not feel that the pt can do the testing and additional appointments by herself and Sandra Merritt would have to be available to help.  I spoke with her about trying to obtain an exception to the visitor policy.  At this time Sandra Merritt feels it would be best to hold of on additional apts for a few weeks.  I advised her that I will contact her with additional information as the hospital makes changes in regards to Covid-19 restrictions. She agreed with plan.  If the pt has any change in symptoms then they will contact Dr Antionette Char office.

## 2018-12-30 NOTE — Progress Notes (Signed)
Sounds good thanks

## 2019-01-03 ENCOUNTER — Telehealth: Payer: Self-pay

## 2019-01-03 NOTE — Telephone Encounter (Signed)

## 2019-01-05 ENCOUNTER — Other Ambulatory Visit: Payer: Self-pay

## 2019-01-05 ENCOUNTER — Ambulatory Visit (INDEPENDENT_AMBULATORY_CARE_PROVIDER_SITE_OTHER): Payer: Medicare HMO | Admitting: *Deleted

## 2019-01-05 DIAGNOSIS — Z5181 Encounter for therapeutic drug level monitoring: Secondary | ICD-10-CM | POA: Diagnosis not present

## 2019-01-05 DIAGNOSIS — I4821 Permanent atrial fibrillation: Secondary | ICD-10-CM | POA: Diagnosis not present

## 2019-01-05 LAB — POCT INR: INR: 2.3 (ref 2.0–3.0)

## 2019-01-05 NOTE — Patient Instructions (Signed)
Spoke with pt continue on same dosage 1 tablet daily except 1/2 tablet on Tuesdays, Thursdays, and Saturdays. Recheck in 6 weeks. Call [phone] if scheduled for any procedure or any change in medications

## 2019-01-06 ENCOUNTER — Telehealth (HOSPITAL_COMMUNITY): Payer: Medicare HMO

## 2019-01-11 ENCOUNTER — Other Ambulatory Visit: Payer: Self-pay

## 2019-01-11 ENCOUNTER — Ambulatory Visit (HOSPITAL_COMMUNITY)
Admission: RE | Admit: 2019-01-11 | Discharge: 2019-01-11 | Disposition: A | Discharge status: Home / Self Care | Payer: Medicare HMO | Source: Ambulatory Visit | Attending: Adult Health | Admitting: Adult Health

## 2019-01-11 VITALS — BP 124/62 | HR 86 | Wt 213.0 lb

## 2019-01-11 DIAGNOSIS — I350 Nonrheumatic aortic (valve) stenosis: Secondary | ICD-10-CM

## 2019-01-11 DIAGNOSIS — I5032 Chronic diastolic (congestive) heart failure: Secondary | ICD-10-CM | POA: Diagnosis not present

## 2019-01-11 DIAGNOSIS — I4821 Permanent atrial fibrillation: Secondary | ICD-10-CM

## 2019-01-11 NOTE — Progress Notes (Signed)
Heart Failure TeleHealth Note  Due to national recommendations of social distancing due to [place] 19, telehealth visit is felt to be most appropriate for this patient at this time.  I discussed the limitations, risks, security and privacy concerns of performing an evaluation and management service by telephone and the availability of in person appointments. I also discussed with the patient that there may be a patient responsible charge related to this service. The patient expressed understanding and agreed to proceed.   ID:  Sandra Merritt, DOB [birth year], MRN [id number]  Location: Home  Provider location: [address], [place] Type of Visit: Established patient   PCP:  Jonathon Jordan, MD  Cardiologist:  Loralie Champagne, MD Primary HF: Dr Aundra Dubin Structural cardiology: Dr Burt Knack  Chief Complaint: HF follow up   History of Present Illness: Sandra Merritt is a 85 y.o. female with a history of permanent atrial fibrillation on coumadin, arthritis, severe AS, mod TR, group 2 PH, and diastolic CHF.  Echo 2/19 EF 55-60%, moderate LVH, PASP 65 mmHg, moderate AS with mean gradient 23 mmHg, severe biatrial enlargement.  Echo was done in 2/20 showing EF 60-65%, moderate LVH, normal RV, severe TR, moderate MR, suspected paradoxical low flow/low gradient aortic stenosis with mean gradient 25 mmHg/AVA 0.83 cm^2, mild AI. Given concern for severe AS, she had a TEE in 3/20 that confirmed severe AS.  RHC/LHC showed no significant coronary disease and mildly elevated filling pressures with pulmonary venous hypertension.    Patient presents via audio conferencing for a telehealth visit today. She is being followed by Dr Burt Knack to TAVR evaluation. Further evaluation has been delayed due to [place]. Overall doing okay. She is at her baseline. Denies SOB getting around the house. She is SOB when she first gets up and rushes to the bathroom. Does not do stairs. Has some edema later in the day, but  completely gone in the mornings. No PND or orthopnea. Has hospital bed. No bleeding on coumadin. No dizziness, presyncope, or syncope. Weights up and down, 213 lbs today. Taking all medications.  Pt denies symptoms of cough, fevers, chills, or new SOB worrisome for COVID 19.    Past Medical History:  Diagnosis Date  . Aortic stenosis    mild by echo 2017  . CKD (chronic kidney disease), stage III ([facility])   . Colon polyps   . COPD (chronic obstructive pulmonary disease) ([place])   . Dyslipidemia   . Hypertension   . Hypothyroid   . LFTs abnormal    History of abnormal LFTs due to Fatty Liver  . Macular degeneration   . Memory loss   . Mitral regurgitation    mdoerate by echo 07/2015  . Obesity   . OSA (obstructive sleep apnea)    intolerant to CPAP  . Osteoarthritis   . Osteopenia   . Permanent atrial fibrillation    Chronic on coumadin  . Pulmonary HTN ([place])    moderate with PASP 37mmHg on echo 07/2015  . Right sided facial pain   . Vitamin D deficiency    Past Surgical History:  Procedure Laterality Date  . BREAST BIOPSY    . CHOLECYSTECTOMY    . KNEE SURGERY Right   . RIGHT HEART CATH N/A 12/15/2016   Procedure: Right Heart Cath;  Surgeon: Larey Dresser, MD;  Location: [facility];  Service: Cardiovascular;  Laterality: N/A;  . RIGHT/LEFT HEART CATH AND CORONARY ANGIOGRAPHY N/A 11/11/2018   Procedure:  RIGHT/LEFT HEART CATH AND CORONARY ANGIOGRAPHY;  Surgeon: Larey Dresser, MD;  Location: [facility];  Service: Cardiovascular;  Laterality: N/A;  . TEE WITHOUT CARDIOVERSION N/A 11/11/2018   Procedure: TRANSESOPHAGEAL ECHOCARDIOGRAM (TEE);  Surgeon: Larey Dresser, MD;  Location: [facility] ENDOSCOPY;  Service: Cardiovascular;  Laterality: N/A;  CATH AFTER PROCEDURE     Current Outpatient Medications  Medication Sig Dispense Refill  . furosemide (LASIX) 40 MG tablet Take 80 mg by mouth 2 (two) times daily.    Marland Kitchen lovastatin (MEVACOR) 40 MG tablet Take 40 mg by mouth  at bedtime.    . metoprolol succinate (TOPROL-XL) 25 MG 24 hr tablet Take 25 mg by mouth daily.    . ramipril (ALTACE) 2.5 MG capsule Take 1 capsule (2.5 mg total) by mouth daily. 90 capsule 1  . warfarin (COUMADIN) 5 MG tablet Take 7.5 mg (1 and 1 half tab) 3/12, and then 5 mg (1 tab) daily until coumadin check. 90 tablet 1  . acetaminophen (TYLENOL) 500 MG tablet Take 1,000 mg by mouth 3 (three) times daily.     Marland Kitchen alendronate (FOSAMAX) 70 MG tablet Take 70 mg by mouth every Wednesday.   11  . Calcium Carb-Cholecalciferol (CALCIUM + D3) 600-200 MG-UNIT TABS Take 1 tablet by mouth 2 (two) times daily.     Marland Kitchen gabapentin (NEURONTIN) 100 MG capsule Take 100 mg by mouth 3 (three) times daily.     . Glucosamine HCl (GLUCOSAMINE MAXIMUM STRENGTH PO) Take 1-2 tablets by mouth See admin instructions. Take 2 tablets in the morning and 1 tablet in the evening    . levothyroxine (SYNTHROID, LEVOTHROID) 150 MCG tablet Take 150 mcg by mouth daily before breakfast.    . Lidocaine 4 % PTCH Apply 1 patch topically daily as needed (pain).    Marland Kitchen loratadine (CLARITIN) 10 MG tablet Take 10 mg by mouth daily as needed for allergies.    . Menthol, Topical Analgesic, (ICY HOT EX) Apply 1 application topically daily as needed (pain).    . Multiple Vitamin (MULTIVITAMIN) tablet Take 1 tablet by mouth at bedtime.     . Multiple Vitamins-Minerals (EYE VITAMINS PO) Take 1 capsule by mouth 2 (two) times daily.     . Polyvinyl Alcohol-Povidone (REFRESH OP) Place 1 drop into both eyes daily as needed (dry eyes).      No current facility-administered medications for this encounter.     Allergies:   Milk-related compounds and Penicillins   Social History:  The patient  reports that she has never smoked. She has never used smokeless tobacco. She reports that she does not drink alcohol or use drugs.   Family History:  The patient's family history includes Diabetes in her sister; Heart disease in her father, mother, and sister.    ROS:  Please see the history of present illness.   All other systems are personally reviewed and negative.    Exam:  (Video/Tele Health Call; Exam is subjective and or/visual.) General:  Speaks in full sentences. No resp difficulty. Lungs: Normal respiratory effort with conversation.  Abdomen: No distension per patient report Extremities: Pt denies edema. Neuro: Alert & oriented x 3.   Recent Labs: 11/17/2018: ALT 24; BUN 21; Creatinine 1.10; Hemoglobin 10.6; Platelet Count 76; Potassium 4.1; Sodium 141  Personally reviewed   Wt Readings from Last 3 Encounters:  01/11/19 96.6 kg (213 lb)  12/24/18 94.8 kg (209 lb)  11/17/18 94.2 kg (207 lb 11.2 oz)      ASSESSMENT  AND PLAN:  1. Atrial fibrillation: Permanent. She is on warfarin with no problems. Rate controlled with Toprol XL. - Continue warfarin.  2. Chronic diastolic CHF: With prominent RV failure. [place] in 4/18 showed pulmonary venous hypertension with elevated left and right heart filling pressures.PYP scan in 12/19 was not suggestive of TTR amyloidosis. - NYHA class II-III symptoms, chronic. Volume sounds stable. -Continue Lasix 80 mg bid. Creatinine stable 10/2018 at 1.10 - Advised that she wear TED hose. She has some home.  3. Aortic stenosis:Severe aortic stenosis confirmed by TEE.  She has had LHC/RHC.  Referred to Dr. Burt Knack for TAVR, evaluation currently on hold due to coronavirus. Dr Burt Knack following. - She would like her caregiver, Alcide Goodness, or her daughter, Virgina Evener, to be present with her for evaluation. 4. Pulmonary hypertension: She has group 2 PH due to LV diastolic dysfunction/elevated LA pressure (pulmonary venous hypertension).  - Continue lasix as above.No change.  5. Tricuspid regurgitation: Moderate on 3/20 TEE (Severe in past). No change.    COVID screen The patient does not have any symptoms that suggest any further testing/ screening at this time.  Social distancing reinforced today.   Patient Risk: After full review of this patients clinical status, I feel that they are at moderate risk for cardiac decompensation at this time.  Orders/Follow up: Follow up in 3 months with Dr Aundra Dubin  Today, I have spent 19 minutes with the patient with telehealth technology discussing the above issues.    Signed, Georgiana Shore, NP  01/11/2019 12:05 PM   [facility] [address] [facility] and [place] [address] [phone] (office) [phone] (fax)

## 2019-01-28 ENCOUNTER — Other Ambulatory Visit: Payer: Self-pay

## 2019-01-28 DIAGNOSIS — I350 Nonrheumatic aortic (valve) stenosis: Secondary | ICD-10-CM

## 2019-01-28 DIAGNOSIS — R0602 Shortness of breath: Secondary | ICD-10-CM

## 2019-01-31 ENCOUNTER — Other Ambulatory Visit: Payer: Self-pay

## 2019-01-31 DIAGNOSIS — N289 Disorder of kidney and ureter, unspecified: Secondary | ICD-10-CM

## 2019-01-31 DIAGNOSIS — I350 Nonrheumatic aortic (valve) stenosis: Secondary | ICD-10-CM

## 2019-02-01 ENCOUNTER — Emergency Department (HOSPITAL_COMMUNITY): Payer: Medicare HMO

## 2019-02-01 ENCOUNTER — Encounter (HOSPITAL_COMMUNITY): Payer: Self-pay | Admitting: Emergency Medicine

## 2019-02-01 ENCOUNTER — Inpatient Hospital Stay (HOSPITAL_COMMUNITY)
Admission: EM | Admit: 2019-02-01 | Discharge: 2019-03-02 | DRG: 085 | Disposition: E | Payer: Medicare HMO | Attending: Internal Medicine | Admitting: Internal Medicine

## 2019-02-01 DIAGNOSIS — C719 Malignant neoplasm of brain, unspecified: Secondary | ICD-10-CM | POA: Diagnosis not present

## 2019-02-01 DIAGNOSIS — G936 Cerebral edema: Secondary | ICD-10-CM | POA: Diagnosis present

## 2019-02-01 DIAGNOSIS — N183 Chronic kidney disease, stage 3 unspecified: Secondary | ICD-10-CM | POA: Diagnosis present

## 2019-02-01 DIAGNOSIS — Z7189 Other specified counseling: Secondary | ICD-10-CM | POA: Diagnosis not present

## 2019-02-01 DIAGNOSIS — Z79890 Hormone replacement therapy (postmenopausal): Secondary | ICD-10-CM

## 2019-02-01 DIAGNOSIS — K760 Fatty (change of) liver, not elsewhere classified: Secondary | ICD-10-CM | POA: Diagnosis present

## 2019-02-01 DIAGNOSIS — I5032 Chronic diastolic (congestive) heart failure: Secondary | ICD-10-CM | POA: Diagnosis present

## 2019-02-01 DIAGNOSIS — I130 Hypertensive heart and chronic kidney disease with heart failure and stage 1 through stage 4 chronic kidney disease, or unspecified chronic kidney disease: Secondary | ICD-10-CM | POA: Diagnosis present

## 2019-02-01 DIAGNOSIS — D509 Iron deficiency anemia, unspecified: Secondary | ICD-10-CM | POA: Diagnosis present

## 2019-02-01 DIAGNOSIS — I350 Nonrheumatic aortic (valve) stenosis: Secondary | ICD-10-CM | POA: Diagnosis not present

## 2019-02-01 DIAGNOSIS — M25461 Effusion, right knee: Secondary | ICD-10-CM | POA: Diagnosis not present

## 2019-02-01 DIAGNOSIS — W1830XA Fall on same level, unspecified, initial encounter: Secondary | ICD-10-CM | POA: Diagnosis present

## 2019-02-01 DIAGNOSIS — M25512 Pain in left shoulder: Secondary | ICD-10-CM | POA: Diagnosis not present

## 2019-02-01 DIAGNOSIS — S299XXA Unspecified injury of thorax, initial encounter: Secondary | ICD-10-CM | POA: Diagnosis not present

## 2019-02-01 DIAGNOSIS — Z7901 Long term (current) use of anticoagulants: Secondary | ICD-10-CM

## 2019-02-01 DIAGNOSIS — I4821 Permanent atrial fibrillation: Secondary | ICD-10-CM | POA: Diagnosis present

## 2019-02-01 DIAGNOSIS — R5381 Other malaise: Secondary | ICD-10-CM | POA: Diagnosis not present

## 2019-02-01 DIAGNOSIS — G9340 Encephalopathy, unspecified: Secondary | ICD-10-CM | POA: Diagnosis not present

## 2019-02-01 DIAGNOSIS — Z1159 Encounter for screening for other viral diseases: Secondary | ICD-10-CM | POA: Diagnosis not present

## 2019-02-01 DIAGNOSIS — M8580 Other specified disorders of bone density and structure, unspecified site: Secondary | ICD-10-CM | POA: Diagnosis present

## 2019-02-01 DIAGNOSIS — R531 Weakness: Secondary | ICD-10-CM

## 2019-02-01 DIAGNOSIS — D696 Thrombocytopenia, unspecified: Secondary | ICD-10-CM | POA: Diagnosis not present

## 2019-02-01 DIAGNOSIS — G9389 Other specified disorders of brain: Secondary | ICD-10-CM | POA: Diagnosis not present

## 2019-02-01 DIAGNOSIS — D496 Neoplasm of unspecified behavior of brain: Secondary | ICD-10-CM | POA: Diagnosis present

## 2019-02-01 DIAGNOSIS — I272 Pulmonary hypertension, unspecified: Secondary | ICD-10-CM | POA: Diagnosis present

## 2019-02-01 DIAGNOSIS — J449 Chronic obstructive pulmonary disease, unspecified: Secondary | ICD-10-CM | POA: Diagnosis present

## 2019-02-01 DIAGNOSIS — D649 Anemia, unspecified: Secondary | ICD-10-CM | POA: Diagnosis not present

## 2019-02-01 DIAGNOSIS — R42 Dizziness and giddiness: Secondary | ICD-10-CM | POA: Diagnosis not present

## 2019-02-01 DIAGNOSIS — E039 Hypothyroidism, unspecified: Secondary | ICD-10-CM | POA: Diagnosis present

## 2019-02-01 DIAGNOSIS — I10 Essential (primary) hypertension: Secondary | ICD-10-CM | POA: Diagnosis present

## 2019-02-01 DIAGNOSIS — Z79899 Other long term (current) drug therapy: Secondary | ICD-10-CM

## 2019-02-01 DIAGNOSIS — S06380A Contusion, laceration, and hemorrhage of brainstem without loss of consciousness, initial encounter: Principal | ICD-10-CM | POA: Diagnosis present

## 2019-02-01 DIAGNOSIS — E785 Hyperlipidemia, unspecified: Secondary | ICD-10-CM | POA: Diagnosis present

## 2019-02-01 DIAGNOSIS — R609 Edema, unspecified: Secondary | ICD-10-CM | POA: Diagnosis not present

## 2019-02-01 DIAGNOSIS — S4992XA Unspecified injury of left shoulder and upper arm, initial encounter: Secondary | ICD-10-CM | POA: Diagnosis not present

## 2019-02-01 DIAGNOSIS — J9601 Acute respiratory failure with hypoxia: Secondary | ICD-10-CM | POA: Diagnosis not present

## 2019-02-01 DIAGNOSIS — Z515 Encounter for palliative care: Secondary | ICD-10-CM | POA: Diagnosis present

## 2019-02-01 DIAGNOSIS — R52 Pain, unspecified: Secondary | ICD-10-CM | POA: Diagnosis not present

## 2019-02-01 DIAGNOSIS — Y92009 Unspecified place in unspecified non-institutional (private) residence as the place of occurrence of the external cause: Secondary | ICD-10-CM

## 2019-02-01 DIAGNOSIS — R400 Somnolence: Secondary | ICD-10-CM | POA: Diagnosis not present

## 2019-02-01 DIAGNOSIS — D539 Nutritional anemia, unspecified: Secondary | ICD-10-CM | POA: Diagnosis not present

## 2019-02-01 DIAGNOSIS — Z66 Do not resuscitate: Secondary | ICD-10-CM | POA: Diagnosis present

## 2019-02-01 DIAGNOSIS — J9691 Respiratory failure, unspecified with hypoxia: Secondary | ICD-10-CM | POA: Diagnosis not present

## 2019-02-01 DIAGNOSIS — N289 Disorder of kidney and ureter, unspecified: Secondary | ICD-10-CM | POA: Diagnosis not present

## 2019-02-01 DIAGNOSIS — C717 Malignant neoplasm of brain stem: Secondary | ICD-10-CM | POA: Diagnosis not present

## 2019-02-01 DIAGNOSIS — R4182 Altered mental status, unspecified: Secondary | ICD-10-CM | POA: Diagnosis present

## 2019-02-01 LAB — CBC
HCT: 33.5 % — ABNORMAL LOW (ref 36.0–46.0)
Hemoglobin: 10.7 g/dL — ABNORMAL LOW (ref 12.0–15.0)
MCH: 35.2 pg — ABNORMAL HIGH (ref 26.0–34.0)
MCHC: 31.9 g/dL (ref 30.0–36.0)
MCV: 110.2 fL — ABNORMAL HIGH (ref 80.0–100.0)
Platelets: 82 10*3/uL — ABNORMAL LOW (ref 150–400)
RBC: 3.04 MIL/uL — ABNORMAL LOW (ref 3.87–5.11)
RDW: 15.6 % — ABNORMAL HIGH (ref 11.5–15.5)
WBC: 4.1 10*3/uL (ref 4.0–10.5)
nRBC: 0 % (ref 0.0–0.2)

## 2019-02-01 LAB — BASIC METABOLIC PANEL
Anion gap: 10 (ref 5–15)
BUN: 28 mg/dL — ABNORMAL HIGH (ref 8–23)
CO2: 32 mmol/L (ref 22–32)
Calcium: 9.4 mg/dL (ref 8.9–10.3)
Chloride: 100 mmol/L (ref 98–111)
Creatinine, Ser: 1.32 mg/dL — ABNORMAL HIGH (ref 0.44–1.00)
GFR calc Af Amer: 43 mL/min — ABNORMAL LOW (ref >60)
GFR calc non Af Amer: 37 mL/min — ABNORMAL LOW (ref >60)
Glucose, Bld: 122 mg/dL — ABNORMAL HIGH (ref 70–99)
Potassium: 3.7 mmol/L (ref 3.5–5.1)
Sodium: 142 mmol/L (ref 135–145)

## 2019-02-01 LAB — I-STAT TROPONIN, ED: Troponin i, poc: 0.03 ng/mL (ref 0.00–0.08)

## 2019-02-01 LAB — PROTIME-INR
INR: 2.3 — ABNORMAL HIGH (ref 0.8–1.2)
Prothrombin Time: 24.7 seconds — ABNORMAL HIGH (ref 11.4–15.2)

## 2019-02-01 LAB — TYPE AND SCREEN
ABO/RH(D): A POS
Antibody Screen: NEGATIVE

## 2019-02-01 LAB — CBG MONITORING, ED: Glucose-Capillary: 87 mg/dL (ref 70–99)

## 2019-02-01 LAB — BRAIN NATRIURETIC PEPTIDE: B Natriuretic Peptide: 858.4 pg/mL — ABNORMAL HIGH (ref 0.0–100.0)

## 2019-02-01 NOTE — ED Notes (Signed)
Pt. Not in  Room will assess when pt. returns

## 2019-02-01 NOTE — ED Triage Notes (Signed)
Pt here from home with c/o gen weakness and right knee pain , family was unable to to get her in the car to take to the MD so called ambulance for transport

## 2019-02-01 NOTE — ED Notes (Signed)
cbg 115 

## 2019-02-01 NOTE — ED Provider Notes (Signed)
[facility] EMERGENCY DEPARTMENT Provider Note   CSN: [id number] Arrival date & time: 02/07/2019  1415    History   Chief Complaint Chief Complaint  Patient presents with   Knee Pain   Weakness    HPI Sandra Merritt is a 85 y.o. female.     85 year old female with prior medical history as documented below presents for evaluation of generalized weakness.  Patient reports 3 to 4 days of worsening weakness.  She reports that she lives at home with family.  She normally ambulates with a walker.  She is currently unable to ambulate.  She was unable to transition from wheelchair to stretcher in the exam room.  Patient denies associated chest pain, shortness of breath, nausea, vomiting, fever.  He does report having a near fall just prior to arrival at the ED today.  After this near fall her right knee and her left shoulder are painful.  She denies hitting her head.  She denies loss of conscious.  The history is provided by the patient and medical records.  Weakness  Severity:  Moderate Onset quality:  Gradual Duration:  4 days Timing:  Constant Progression:  Waxing and waning Chronicity:  New Relieved by:  Nothing Worsened by:  Nothing Ineffective treatments:  None tried   Past Medical History:  Diagnosis Date   Aortic stenosis    mild by echo 2017   CKD (chronic kidney disease), stage III ([facility])    Colon polyps    COPD (chronic obstructive pulmonary disease) ([facility])    Dyslipidemia    Hypertension    Hypothyroid    LFTs abnormal    History of abnormal LFTs due to Fatty Liver   Macular degeneration    Memory loss    Mitral regurgitation    mdoerate by echo 07/2015   Obesity    OSA (obstructive sleep apnea)    intolerant to CPAP   Osteoarthritis    Osteopenia    Permanent atrial fibrillation    Chronic on coumadin   Pulmonary HTN ([facility])    moderate with PASP 56mmHg on echo 07/2015   Right sided facial pain    Vitamin D deficiency       Patient Active Problem List   Diagnosis Date Noted   Chronic diastolic heart failure ([place]) 09/23/2017   Pulmonary HTN ([place])    Mitral insufficiency    Aortic stenosis    Headache 07/17/2014   Encounter for therapeutic drug monitoring 11/04/2013   Benign essential HTN 09/13/2013   Permanent atrial fibrillation 06/27/2013    Past Surgical History:  Procedure Laterality Date   BREAST BIOPSY     CHOLECYSTECTOMY     KNEE SURGERY Right    RIGHT HEART CATH N/A 12/15/2016   Procedure: Right Heart Cath;  Surgeon: Larey Dresser, MD;  Location: [facility];  Service: Cardiovascular;  Laterality: N/A;   RIGHT/LEFT HEART CATH AND CORONARY ANGIOGRAPHY N/A 11/11/2018   Procedure: RIGHT/LEFT HEART CATH AND CORONARY ANGIOGRAPHY;  Surgeon: Larey Dresser, MD;  Location: [facility];  Service: Cardiovascular;  Laterality: N/A;   TEE WITHOUT CARDIOVERSION N/A 11/11/2018   Procedure: TRANSESOPHAGEAL ECHOCARDIOGRAM (TEE);  Surgeon: Larey Dresser, MD;  Location: [facility] ENDOSCOPY;  Service: Cardiovascular;  Laterality: N/A;  CATH AFTER PROCEDURE     OB History   No obstetric history on file.      Home Medications    Prior to Admission medications   Medication Sig Start Date End  Date Taking? Authorizing Provider  acetaminophen (TYLENOL) 500 MG tablet Take 1,000 mg by mouth 3 (three) times daily.     [provider]  alendronate (FOSAMAX) 70 MG tablet Take 70 mg by mouth every Wednesday.  07/12/15   [provider]  Calcium Carb-Cholecalciferol (CALCIUM + D3) 600-200 MG-UNIT TABS Take 1 tablet by mouth 2 (two) times daily.     [provider]  furosemide (LASIX) 40 MG tablet Take 80 mg by mouth 2 (two) times daily.    [provider]  gabapentin (NEURONTIN) 100 MG capsule Take 100 mg by mouth 3 (three) times daily.  02/05/18   [provider]  Glucosamine HCl (GLUCOSAMINE MAXIMUM STRENGTH PO) Take 1-2 tablets by mouth See  admin instructions. Take 2 tablets in the morning and 1 tablet in the evening    [provider]  levothyroxine (SYNTHROID, LEVOTHROID) 150 MCG tablet Take 150 mcg by mouth daily before breakfast.    [provider]  Lidocaine 4 % PTCH Apply 1 patch topically daily as needed (pain).    [provider]  loratadine (CLARITIN) 10 MG tablet Take 10 mg by mouth daily as needed for allergies.    [provider]  lovastatin (MEVACOR) 40 MG tablet Take 40 mg by mouth at bedtime.    [provider]  Menthol, Topical Analgesic, (ICY HOT EX) Apply 1 application topically daily as needed (pain).    [provider]  metoprolol succinate (TOPROL-XL) 25 MG 24 hr tablet Take 25 mg by mouth daily.    [provider]  Multiple Vitamin (MULTIVITAMIN) tablet Take 1 tablet by mouth at bedtime.     [provider]  Multiple Vitamins-Minerals (EYE VITAMINS PO) Take 1 capsule by mouth 2 (two) times daily.     [provider]  Polyvinyl Alcohol-Povidone (REFRESH OP) Place 1 drop into both eyes daily as needed (dry eyes).     [provider]  ramipril (ALTACE) 2.5 MG capsule Take 1 capsule (2.5 mg total) by mouth daily. 09/17/18   Sueanne Margarita, MD  warfarin (COUMADIN) 5 MG tablet Take 7.5 mg (1 and 1 half tab) 3/12, and then 5 mg (1 tab) daily until coumadin check. 11/11/18   Shirley Friar, PA-C    Family History Family History  Problem Relation Age of Onset   Heart disease Mother    Heart disease Father    Heart disease Sister    Diabetes Sister     Social History Social History   Tobacco Use   Smoking status: Never Smoker   Smokeless tobacco: Never Used  Substance Use Topics   Alcohol use: No   Drug use: No     Allergies   Milk-related compounds and Penicillins   Review of Systems Review of Systems  Neurological: Positive for weakness.  All other systems reviewed and are  negative.    Physical Exam Updated Vital Signs BP (!) 105/58    Pulse 66    Temp 98.3 F (36.8 C)    Resp 18    SpO2 95%   Physical Exam Vitals signs and nursing note reviewed.  Constitutional:      General: She is not in acute distress.    Appearance: She is well-developed.  HENT:     Head: Normocephalic and atraumatic.  Eyes:     Conjunctiva/sclera: Conjunctivae normal.     Pupils: Pupils are equal, round, and reactive to light.  Neck:     Musculoskeletal: Normal  range of motion and neck supple.  Cardiovascular:     Rate and Rhythm: Normal rate and regular rhythm.     Heart sounds: Normal heart sounds.  Pulmonary:     Effort: Pulmonary effort is normal. No respiratory distress.     Breath sounds: Normal breath sounds.  Abdominal:     General: There is no distension.     Palpations: Abdomen is soft.     Tenderness: There is no abdominal tenderness.  Musculoskeletal: Normal range of motion.        General: No deformity.  Skin:    General: Skin is warm and dry.  Neurological:     General: No focal deficit present.     Mental Status: She is alert and oriented to person, place, and time.     Motor: Weakness present.     Comments: Patient with generalized weakness.  No focal weakness noted.  VAN negative  AOx4  No facial droop  Normal speech       ED Treatments / Results  Labs (all labs ordered are listed, but only abnormal results are displayed) Labs Reviewed  BASIC METABOLIC PANEL - Abnormal; Notable for the following components:      Result Value   Glucose, Bld 122 (*)    BUN 28 (*)    Creatinine, Ser 1.32 (*)    GFR calc non Af Amer 37 (*)    GFR calc Af Amer 43 (*)    All other components within normal limits  CBC - Abnormal; Notable for the following components:   RBC 3.04 (*)    Hemoglobin 10.7 (*)    HCT 33.5 (*)    MCV 110.2 (*)    MCH 35.2 (*)    RDW 15.6 (*)    Platelets 82 (*)    All other components within normal limits  PROTIME-INR -  Abnormal; Notable for the following components:   Prothrombin Time 24.7 (*)    INR 2.3 (*)    All other components within normal limits  BRAIN NATRIURETIC PEPTIDE - Abnormal; Notable for the following components:   B Natriuretic Peptide 858.4 (*)    All other components within normal limits  SARS CORONAVIRUS 2 (HOSPITAL ORDER, [facility] LAB)  URINALYSIS, ROUTINE W REFLEX MICROSCOPIC  BASIC METABOLIC PANEL  CBC WITH DIFFERENTIAL/PLATELET  CBG MONITORING, ED  I-STAT TROPONIN, ED  TYPE AND SCREEN  ABO/RH    EKG EKG Interpretation  Date/Time:  Tuesday February 01 2019 14:29:53 EDT Ventricular Rate:  56 PR Interval:    QRS Duration: 92 QT Interval:  442 QTC Calculation: 426 R Axis:   56 Text Interpretation:  Undetermined rhythm Cannot rule out Anterior infarct , age undetermined Abnormal ECG Confirmed by Dene Gentry [phone]) on 01/31/2019 3:20:58 PM Also confirmed by Dene Gentry [phone]), editor Philomena Doheny [phone])  on 02/25/2019 4:06:50 PM   Radiology Dg Knee 2 Views Right  Result Date: 02/20/2019 CLINICAL DATA:  [place].  Right knee pain. EXAM: RIGHT KNEE - 1-2 VIEW COMPARISON:  MRI 01/15/2006 FINDINGS: Advanced tricompartmental degenerative changes most significant in the lateral compartment with joint space narrowing and osteophytic spurring. Fairly extensive chondrocalcinosis is noted. No acute fracture is identified. There is a small to moderate-sized joint effusion. IMPRESSION: 1. Advanced tricompartmental degenerative changes and chondrocalcinosis. 2. No definite acute fracture. 3. Moderate-sized joint effusion. Electronically Signed   By: Marijo Sanes M.D.   On: 02/20/2019 16:45   Ct Head Wo Contrast  Result Date: 02/04/2019  CLINICAL DATA:  Generalized weakness, altered level of consciousness EXAM: CT HEAD WITHOUT CONTRAST TECHNIQUE: Contiguous axial images were obtained from the base of the skull through the vertex without intravenous contrast. COMPARISON:   06/13/2016, CT 07/17/2014 FINDINGS: Brain: There is a rounded area noted within the right side of the brainstem which is slightly hyperdense with higher rim density surrounding it. This is new since prior CT from 2015. Suggestion of vascular maceration on prior MRI in the adjacent cistern. Therefore, I favor this represents hemorrhage although underlying metastasis/mass lesion cannot be excluded. Recommend MRI for further evaluation. No additional abnormality. No hydrocephalus. No mass effect or midline shift. Vascular: No hyperdense vessel or unexpected calcification. Skull: No acute calvarial abnormality. Sinuses/Orbits: Visualized paranasal sinuses and mastoids clear. Orbital soft tissues unremarkable. Other: None IMPRESSION: Hyperdense area in the right side of the brainstem concerning for hemorrhage or mass lesion/metastasis. Recommend further evaluation with MRI with and without contrast. Electronically Signed   By: Rolm Baptise M.D.   On: 03/01/2019 16:56   Mr Brain W And Wo Contrast  Result Date: 02/02/2019 CLINICAL DATA:  Weakness and ataxia.  Abnormal head CT. EXAM: MRI HEAD WITHOUT AND WITH CONTRAST TECHNIQUE: Multiplanar, multiecho pulse sequences of the brain and surrounding structures were obtained without and with intravenous contrast. CONTRAST:  10 mL Gadavist COMPARISON:  Head CT 02/09/2019 Brain MRI 06/13/2016 FINDINGS: Brain: There is a lesion within the right pons with low T1 and high T2-weighted intrinsic signal. There is anterior contrast enhancement within the lesion. The size of the lesion is 1.4 x 1.1 x 1.3 cm. There are no other contrast-enhancing lesions within the brain. There is no acute ischemia. There is mild edema surrounding the lesion extending into the right cerebral peduncle and right middle cerebellar peduncle. There is peripheral hemorrhage surrounding the lesion. There is also chronic microhemorrhage in the peripheral left frontal lobe Vascular: Normal flow voids. Skull and  upper cervical spine: Normal marrow signal. Sinuses/Orbits: Paranasal sinuses and mastoid air cells are clear. Normal orbits. IMPRESSION: 1. Contrast-enhancing mass within the right pons with surrounding edema extending into the cerebral and middle cerebellar peduncles. Thin rim of peripheral blood products. Primary differential considerations include hemangioblastoma and pontine glioma. This would be an unusual location for a solitary intracranial metastasis. 2. No acute infarct. Electronically Signed   By: Ulyses Jarred M.D.   On: 02/02/2019 00:39   Dg Chest Port 1 View  Result Date: 01/31/2019 CLINICAL DATA:  [place]. EXAM: PORTABLE CHEST 1 VIEW COMPARISON:  Chest x-ray 11/30/2017 FINDINGS: The heart is mildly enlarged but stable. Stable tortuosity and calcification of the thoracic aorta. Very low lung volumes with vascular crowding and streaky bibasilar atelectasis. No definite pleural effusions. No pneumothorax. The bony thorax is intact. No obvious rib fractures. Both shoulders are normally located. IMPRESSION: Very low lung volumes with vascular crowding and bibasilar atelectasis. Intact bony thorax. Electronically Signed   By: Marijo Sanes M.D.   On: 02/04/2019 16:43   Dg Shoulder Left  Result Date: 02/12/2019 CLINICAL DATA:  [place].  Left shoulder pain. EXAM: LEFT SHOULDER - 2+ VIEW COMPARISON:  None. FINDINGS: Moderate glenohumeral and AC joint degenerative changes with joint space narrowing and spurring. No acute fracture or dislocation. IMPRESSION: Degenerative changes but no fracture or dislocation. Electronically Signed   By: Marijo Sanes M.D.   On: 02/28/2019 16:46    Procedures Procedures (including critical care time)  Medications Ordered in ED Medications - No data to display   Initial Impression /  Assessment and Plan / ED Course  I have reviewed the triage vital signs and the nursing notes.  Pertinent labs & imaging results that were available during my care of the patient were  reviewed by me and considered in my medical decision making (see chart for details).        MDM  Screen complete  Sandra Merritt was evaluated in Emergency Department on 02/18/2019 for the symptoms described in the history of present illness. She was evaluated in the context of the global COVID-19 pandemic, which necessitated consideration that the patient might be at risk for infection with the SARS-CoV-2 virus that causes COVID-19. Institutional protocols and algorithms that pertain to the evaluation of patients at risk for COVID-19 are in a state of rapid change based on information released by regulatory bodies including the CDC and federal and state organizations. These policies and algorithms were followed during the patient's care in the ED.  Patient is presenting for evaluation of significant weakness.  Initial screening labs are on the whole without significant findings. CT Brain does suggest possibility of right brainstem lesion (mass vs hemorrhage).  Neurosurgery is aware of case - agrees with plan to obtain MRI.  Patient signed out to Dr. Roxanne Mins - MRI and disposition pending.    Final Clinical Impressions(s) / ED Diagnoses   Final diagnoses:  Brain tumor ([place])  Weakness  Macrocytic anemia  Renal insufficiency  Anticoagulated on warfarin    ED Discharge Orders    None       Valarie Merino, MD 02/02/19 [phone]

## 2019-02-02 ENCOUNTER — Encounter (HOSPITAL_COMMUNITY): Payer: Self-pay | Admitting: Family Medicine

## 2019-02-02 ENCOUNTER — Other Ambulatory Visit: Payer: Self-pay

## 2019-02-02 DIAGNOSIS — D496 Neoplasm of unspecified behavior of brain: Secondary | ICD-10-CM | POA: Diagnosis present

## 2019-02-02 DIAGNOSIS — D539 Nutritional anemia, unspecified: Secondary | ICD-10-CM | POA: Diagnosis present

## 2019-02-02 DIAGNOSIS — N183 Chronic kidney disease, stage 3 unspecified: Secondary | ICD-10-CM | POA: Diagnosis present

## 2019-02-02 DIAGNOSIS — J9601 Acute respiratory failure with hypoxia: Secondary | ICD-10-CM | POA: Diagnosis not present

## 2019-02-02 DIAGNOSIS — D696 Thrombocytopenia, unspecified: Secondary | ICD-10-CM | POA: Diagnosis present

## 2019-02-02 DIAGNOSIS — I5032 Chronic diastolic (congestive) heart failure: Secondary | ICD-10-CM | POA: Diagnosis present

## 2019-02-02 DIAGNOSIS — Z1159 Encounter for screening for other viral diseases: Secondary | ICD-10-CM | POA: Diagnosis not present

## 2019-02-02 DIAGNOSIS — Z7189 Other specified counseling: Secondary | ICD-10-CM | POA: Diagnosis not present

## 2019-02-02 DIAGNOSIS — Z515 Encounter for palliative care: Secondary | ICD-10-CM

## 2019-02-02 DIAGNOSIS — J9691 Respiratory failure, unspecified with hypoxia: Secondary | ICD-10-CM | POA: Diagnosis present

## 2019-02-02 DIAGNOSIS — D509 Iron deficiency anemia, unspecified: Secondary | ICD-10-CM | POA: Diagnosis present

## 2019-02-02 DIAGNOSIS — G9340 Encephalopathy, unspecified: Secondary | ICD-10-CM | POA: Diagnosis present

## 2019-02-02 DIAGNOSIS — Z7901 Long term (current) use of anticoagulants: Secondary | ICD-10-CM | POA: Diagnosis not present

## 2019-02-02 DIAGNOSIS — R4182 Altered mental status, unspecified: Secondary | ICD-10-CM | POA: Diagnosis present

## 2019-02-02 DIAGNOSIS — E039 Hypothyroidism, unspecified: Secondary | ICD-10-CM | POA: Diagnosis present

## 2019-02-02 DIAGNOSIS — G9389 Other specified disorders of brain: Secondary | ICD-10-CM | POA: Diagnosis not present

## 2019-02-02 DIAGNOSIS — J449 Chronic obstructive pulmonary disease, unspecified: Secondary | ICD-10-CM | POA: Diagnosis present

## 2019-02-02 DIAGNOSIS — K760 Fatty (change of) liver, not elsewhere classified: Secondary | ICD-10-CM | POA: Diagnosis present

## 2019-02-02 DIAGNOSIS — W1830XA Fall on same level, unspecified, initial encounter: Secondary | ICD-10-CM | POA: Diagnosis present

## 2019-02-02 DIAGNOSIS — M8580 Other specified disorders of bone density and structure, unspecified site: Secondary | ICD-10-CM | POA: Diagnosis present

## 2019-02-02 DIAGNOSIS — E785 Hyperlipidemia, unspecified: Secondary | ICD-10-CM | POA: Diagnosis present

## 2019-02-02 DIAGNOSIS — G936 Cerebral edema: Secondary | ICD-10-CM | POA: Diagnosis present

## 2019-02-02 DIAGNOSIS — Z79899 Other long term (current) drug therapy: Secondary | ICD-10-CM | POA: Diagnosis not present

## 2019-02-02 DIAGNOSIS — I4821 Permanent atrial fibrillation: Secondary | ICD-10-CM

## 2019-02-02 DIAGNOSIS — I272 Pulmonary hypertension, unspecified: Secondary | ICD-10-CM | POA: Diagnosis present

## 2019-02-02 DIAGNOSIS — I130 Hypertensive heart and chronic kidney disease with heart failure and stage 1 through stage 4 chronic kidney disease, or unspecified chronic kidney disease: Secondary | ICD-10-CM | POA: Diagnosis present

## 2019-02-02 DIAGNOSIS — Y92009 Unspecified place in unspecified non-institutional (private) residence as the place of occurrence of the external cause: Secondary | ICD-10-CM | POA: Diagnosis not present

## 2019-02-02 DIAGNOSIS — Z66 Do not resuscitate: Secondary | ICD-10-CM | POA: Diagnosis present

## 2019-02-02 DIAGNOSIS — S06380A Contusion, laceration, and hemorrhage of brainstem without loss of consciousness, initial encounter: Secondary | ICD-10-CM | POA: Diagnosis present

## 2019-02-02 DIAGNOSIS — Z79890 Hormone replacement therapy (postmenopausal): Secondary | ICD-10-CM | POA: Diagnosis not present

## 2019-02-02 LAB — BASIC METABOLIC PANEL
Anion gap: 12 (ref 5–15)
BUN: 25 mg/dL — ABNORMAL HIGH (ref 8–23)
CO2: 31 mmol/L (ref 22–32)
Calcium: 9.4 mg/dL (ref 8.9–10.3)
Chloride: 101 mmol/L (ref 98–111)
Creatinine, Ser: 1.09 mg/dL — ABNORMAL HIGH (ref 0.44–1.00)
GFR calc Af Amer: 54 mL/min — ABNORMAL LOW (ref >60)
GFR calc non Af Amer: 47 mL/min — ABNORMAL LOW (ref >60)
Glucose, Bld: 105 mg/dL — ABNORMAL HIGH (ref 70–99)
Potassium: 3.8 mmol/L (ref 3.5–5.1)
Sodium: 144 mmol/L (ref 135–145)

## 2019-02-02 LAB — CBC WITH DIFFERENTIAL/PLATELET
Abs Immature Granulocytes: 0.01 10*3/uL (ref 0.00–0.07)
Basophils Absolute: 0 10*3/uL (ref 0.0–0.1)
Basophils Relative: 1 %
Eosinophils Absolute: 0.1 10*3/uL (ref 0.0–0.5)
Eosinophils Relative: 1 %
HCT: 31.9 % — ABNORMAL LOW (ref 36.0–46.0)
Hemoglobin: 10.4 g/dL — ABNORMAL LOW (ref 12.0–15.0)
Immature Granulocytes: 0 %
Lymphocytes Relative: 13 %
Lymphs Abs: 0.6 10*3/uL — ABNORMAL LOW (ref 0.7–4.0)
MCH: 35.7 pg — ABNORMAL HIGH (ref 26.0–34.0)
MCHC: 32.6 g/dL (ref 30.0–36.0)
MCV: 109.6 fL — ABNORMAL HIGH (ref 80.0–100.0)
Monocytes Absolute: 0.3 10*3/uL (ref 0.1–1.0)
Monocytes Relative: 8 %
Neutro Abs: 3.3 10*3/uL (ref 1.7–7.7)
Neutrophils Relative %: 77 %
Platelets: 79 10*3/uL — ABNORMAL LOW (ref 150–400)
RBC: 2.91 MIL/uL — ABNORMAL LOW (ref 3.87–5.11)
RDW: 15.4 % (ref 11.5–15.5)
WBC: 4.2 10*3/uL (ref 4.0–10.5)
nRBC: 0 % (ref 0.0–0.2)

## 2019-02-02 LAB — POCT I-STAT 7, (LYTES, BLD GAS, ICA,H+H)
Acid-Base Excess: 7 mmol/L — ABNORMAL HIGH (ref 0.0–2.0)
Bicarbonate: 34.1 mmol/L — ABNORMAL HIGH (ref 20.0–28.0)
Calcium, Ion: 1.22 mmol/L (ref 1.15–1.40)
HCT: 34 % — ABNORMAL LOW (ref 36.0–46.0)
Hemoglobin: 11.6 g/dL — ABNORMAL LOW (ref 12.0–15.0)
O2 Saturation: 98 %
Potassium: 4.1 mmol/L (ref 3.5–5.1)
Sodium: 142 mmol/L (ref 135–145)
TCO2: 36 mmol/L — ABNORMAL HIGH (ref 22–32)
pCO2 arterial: 60.1 mmHg — ABNORMAL HIGH (ref 32.0–48.0)
pH, Arterial: 7.362 (ref 7.350–7.450)
pO2, Arterial: 104 mmHg (ref 83.0–108.0)

## 2019-02-02 LAB — SARS CORONAVIRUS 2 BY RT PCR (HOSPITAL ORDER, PERFORMED IN ~~LOC~~ HOSPITAL LAB): SARS Coronavirus 2: NEGATIVE

## 2019-02-02 LAB — URINALYSIS, ROUTINE W REFLEX MICROSCOPIC
Bilirubin Urine: NEGATIVE
Glucose, UA: NEGATIVE mg/dL
Hgb urine dipstick: NEGATIVE
Ketones, ur: NEGATIVE mg/dL
Leukocytes,Ua: NEGATIVE
Nitrite: NEGATIVE
Protein, ur: NEGATIVE mg/dL
Specific Gravity, Urine: 1.009 (ref 1.005–1.030)
pH: 6 (ref 5.0–8.0)

## 2019-02-02 LAB — ABO/RH: ABO/RH(D): A POS

## 2019-02-02 LAB — CBG MONITORING, ED: Glucose-Capillary: 122 mg/dL — ABNORMAL HIGH (ref 70–99)

## 2019-02-02 LAB — PROTIME-INR
INR: 2.3 — ABNORMAL HIGH (ref 0.8–1.2)
Prothrombin Time: 25.4 seconds — ABNORMAL HIGH (ref 11.4–15.2)

## 2019-02-02 MED ORDER — ACETAMINOPHEN 325 MG PO TABS: 650.0000 mg | ORAL_TABLET | Freq: Four times a day (QID) | ORAL | Status: DC | PRN

## 2019-02-02 MED ORDER — SODIUM CHLORIDE 0.9% FLUSH
3.0000 mL | INTRAVENOUS | Status: DC | PRN
  Administered 2019-02-02: 3 mL via INTRAVENOUS

## 2019-02-02 MED ORDER — FUROSEMIDE 20 MG PO TABS
80.0000 mg | ORAL_TABLET | Freq: Two times a day (BID) | ORAL | Status: DC
  Filled 2019-02-02: qty 4

## 2019-02-02 MED ORDER — GADOBUTROL 1 MMOL/ML IV SOLN
10.0000 mL | Freq: Once | INTRAVENOUS | Status: AC | PRN
  Administered 2019-02-02: 10 mL via INTRAVENOUS

## 2019-02-02 MED ORDER — PRAVASTATIN SODIUM 40 MG PO TABS: 40.0000 mg | ORAL_TABLET | Freq: Every day | ORAL | Status: DC

## 2019-02-02 MED ORDER — SODIUM CHLORIDE 0.9% FLUSH
3.0000 mL | Freq: Two times a day (BID) | INTRAVENOUS | Status: DC
  Administered 2019-02-02 – 2019-02-03 (×3): 3 mL via INTRAVENOUS

## 2019-02-02 MED ORDER — GLYCOPYRROLATE 0.2 MG/ML IJ SOLN
0.4000 mg | INTRAMUSCULAR | Status: DC
  Administered 2019-02-02 – 2019-02-03 (×7): 0.4 mg via INTRAVENOUS
  Filled 2019-02-02 (×7): qty 2

## 2019-02-02 MED ORDER — SODIUM CHLORIDE 0.9 % IV SOLN: 250.0000 mL | INTRAVENOUS | Status: DC | PRN

## 2019-02-02 MED ORDER — HYDROCODONE-ACETAMINOPHEN 5-325 MG PO TABS
1.0000 | ORAL_TABLET | ORAL | Status: DC | PRN
  Administered 2019-02-02: 1 via ORAL
  Filled 2019-02-02: qty 1

## 2019-02-02 MED ORDER — POLYETHYLENE GLYCOL 3350 17 G PO PACK: 17.0000 g | PACK | Freq: Every day | ORAL | Status: DC | PRN

## 2019-02-02 MED ORDER — ONDANSETRON HCL 4 MG PO TABS: 4.0000 mg | ORAL_TABLET | Freq: Four times a day (QID) | ORAL | Status: DC | PRN

## 2019-02-02 MED ORDER — MORPHINE SULFATE (PF) 2 MG/ML IV SOLN
1.0000 mg | INTRAVENOUS | Status: DC | PRN
  Administered 2019-02-02 – 2019-02-03 (×2): 1 mg via INTRAVENOUS
  Filled 2019-02-02 (×2): qty 1

## 2019-02-02 MED ORDER — METOPROLOL SUCCINATE ER 25 MG PO TB24: 25.0000 mg | ORAL_TABLET | Freq: Every day | ORAL | Status: DC

## 2019-02-02 MED ORDER — SODIUM CHLORIDE 0.9 % IV BOLUS
250.0000 mL | Freq: Once | INTRAVENOUS | Status: AC
  Administered 2019-02-02: 250 mL via INTRAVENOUS

## 2019-02-02 MED ORDER — ACETAMINOPHEN 650 MG RE SUPP: 650.0000 mg | Freq: Four times a day (QID) | RECTAL | Status: DC | PRN

## 2019-02-02 MED ORDER — FENTANYL CITRATE (PF) 100 MCG/2ML IJ SOLN: 25.0000 ug | INTRAMUSCULAR | Status: DC | PRN

## 2019-02-02 MED ORDER — LEVOTHYROXINE SODIUM 75 MCG PO TABS
150.0000 ug | ORAL_TABLET | Freq: Every day | ORAL | Status: DC
  Filled 2019-02-02: qty 2

## 2019-02-02 MED ORDER — SODIUM CHLORIDE 0.9% FLUSH
3.0000 mL | Freq: Two times a day (BID) | INTRAVENOUS | Status: DC
  Administered 2019-02-02 – 2019-02-03 (×4): 3 mL via INTRAVENOUS

## 2019-02-02 MED ORDER — POLYVINYL ALCOHOL 1.4 % OP SOLN
1.0000 [drp] | Freq: Four times a day (QID) | OPHTHALMIC | Status: DC | PRN
  Filled 2019-02-02: qty 15

## 2019-02-02 MED ORDER — NALOXONE HCL 0.4 MG/ML IJ SOLN
0.4000 mg | Freq: Once | INTRAMUSCULAR | Status: AC
  Administered 2019-02-02: 0.4 mg via INTRAVENOUS
  Filled 2019-02-02: qty 1

## 2019-02-02 MED ORDER — DEXAMETHASONE SODIUM PHOSPHATE 4 MG/ML IJ SOLN
4.0000 mg | Freq: Four times a day (QID) | INTRAMUSCULAR | Status: DC
  Administered 2019-02-02 – 2019-02-03 (×6): 4 mg via INTRAVENOUS
  Filled 2019-02-02 (×6): qty 1

## 2019-02-02 MED ORDER — ONDANSETRON HCL 4 MG/2ML IJ SOLN: 4.0000 mg | Freq: Four times a day (QID) | INTRAMUSCULAR | Status: DC | PRN

## 2019-02-02 MED ORDER — GABAPENTIN 100 MG PO CAPS: 100.0000 mg | ORAL_CAPSULE | Freq: Three times a day (TID) | ORAL | Status: DC

## 2019-02-02 MED ORDER — DEXAMETHASONE SODIUM PHOSPHATE 10 MG/ML IJ SOLN
10.0000 mg | Freq: Once | INTRAMUSCULAR | Status: AC
  Administered 2019-02-02: 10 mg via INTRAVENOUS
  Filled 2019-02-02: qty 1

## 2019-02-02 NOTE — Consult Note (Addendum)
Consultation Note Date: 02/02/2019   Patient Name: Sandra Merritt  DOB: 1932-01-23  MRN: [id number]  Age / Sex: 85 y.o., female  PCP: Jonathon Jordan, MD Referring Physician: Elmarie Shiley, MD  Reason for Consultation: Establishing goals of care  HPI/Patient Profile: 85 y.o. female  with past medical history of CKD stage 3, atrial fibrillation on warfarin, diastolic CHF, aortic stenosis, recent left atrial thrombus admitted on 02/24/2019 from home with muscle weakness and dizziness worsening over past few days. Mental status worsened in ED and ultimately unresponsive with brain mass/pontine bleed and not a surgical candidate. Family have opted NOT to pursue aggressive intervention.   Clinical Assessment and Goals of Care: I met today with daughters at bedside in ED. Sandra Merritt is unresponsive but does on occasion make a reflex movement. She is also having some secretions.   I spoke with family, reviewed timeline and scans with them. Discussed expectations and they all agree that comfort is the best option. We will add medication to ensure comfort, move to regular room, and palliative will follow to ensure comfort. We discussed poor prognosis as hours to days and anticipated hospital death. If she stays "stable" then we can consider transition to hospice facility is indicated.   There are some complex family dynamics with power struggle for information and decision making BUT goals are clear and likely no further decisions to be made as family all agree for comfort.   All question/concerns addressed. Emotional support provided.   Primary Decision Maker HCPOA [name] per report (although no paperwork seen)     SUMMARY OF RECOMMENDATIONS   - Comfort care  Code Status/Advance Care Planning:  DNR   Symptom Management:   Comfort medications added PRN.  Robinul scheduled for secretions.   Palliative  Prophylaxis:   Frequent Pain Assessment, Oral Care and Turn Reposition  Additional Recommendations (Limitations, Scope, Preferences):  Full Comfort Care  Psycho-social/Spiritual:   Desire for further Chaplaincy support:yes  Additional Recommendations: Grief/Bereavement Support  Prognosis:   Hours - Days  Discharge Planning: Anticipated Hospital Death      Primary Diagnoses: Present on Admission: . 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 Permanent atrial fibrillation . Chronic diastolic heart failure ([place]) . Benign essential HTN . CKD (chronic kidney disease), stage III ([place]) . Thrombocytopenia ([place]) . Macrocytic anemia   I have reviewed the medical record, interviewed the patient and family, and examined the patient. The following aspects are pertinent.  Past Medical History:  Diagnosis Date  . Aortic stenosis    mild by echo 2017  . CKD (chronic kidney disease), stage III ([place])   . Colon polyps   . COPD (chronic obstructive pulmonary disease) ([place])   . Dyslipidemia   . Hypertension   . Hypothyroid   . LFTs abnormal    History of abnormal LFTs due to Fatty Liver  . Macular degeneration   . Memory loss   . Mitral regurgitation    mdoerate by echo 07/2015  . Obesity   . OSA (obstructive sleep apnea)  intolerant to CPAP  . Osteoarthritis   . Osteopenia   . Permanent atrial fibrillation    Chronic on coumadin  . Pulmonary HTN ([place])    moderate with PASP 98mHg on echo 07/2015  . Right sided facial pain   . Vitamin D deficiency    Social History   Socioeconomic History  . Marital status: Married    Spouse name: Not on file  . Number of children: 3  . Years of education: HS  . Highest education level: Not on file  Occupational History  . Not on file  Social Needs  . Financial resource strain: Not on file  . Food insecurity:    Worry: Not on file    Inability: Not on file  . Transportation needs:    Medical: Not on file    Non-medical: Not on file  Tobacco  Use  . Smoking status: Never Smoker  . Smokeless tobacco: Never Used  Substance and Sexual Activity  . Alcohol use: No  . Drug use: No  . Sexual activity: Not on file  Lifestyle  . Physical activity:    Days per week: Not on file    Minutes per session: Not on file  . Stress: Not on file  Relationships  . Social connections:    Talks on phone: Not on file    Gets together: Not on file    Attends religious service: Not on file    Active member of club or organization: Not on file    Attends meetings of clubs or organizations: Not on file    Relationship status: Not on file  Other Topics Concern  . Not on file  Social History Narrative   Lives at home with two daughters and her friend.   Left-handed.   No caffeine use.   Family History  Problem Relation Age of Onset  . Heart disease Mother   . Heart disease Father   . Heart disease Sister   . Diabetes Sister    Scheduled Meds: . dexamethasone  4 mg Intravenous Q6H  . levothyroxine  150 mcg Oral QAC breakfast  . metoprolol succinate  25 mg Oral Daily  . sodium chloride flush  3 mL Intravenous Q12H  . sodium chloride flush  3 mL Intravenous Q12H   Continuous Infusions: . sodium chloride     PRN Meds:.sodium chloride, acetaminophen **OR** acetaminophen, ondansetron **OR** ondansetron (ZOFRAN) IV, polyethylene glycol, sodium chloride flush Allergies  Allergen Reactions  . Milk-Related Compounds Other (See Comments)    cough  . Penicillins Hives and Rash    Did it involve swelling of the face/tongue/throat, SOB, or low BP? No Did it involve sudden or severe rash/hives, skin peeling, or any reaction on the inside of your mouth or nose? No Did you need to seek medical attention at a hospital or doctor's office? No When did it last happen?years If all above answers are "NO", may proceed with cephalosporin use.    Review of Systems  Unable to perform ROS: Acuity of condition    Physical Exam Vitals signs and  nursing note reviewed.  Constitutional:      General: She is not in acute distress.    Appearance: She is ill-appearing.  Cardiovascular:     Rate and Rhythm: Normal rate.  Pulmonary:     Effort: Pulmonary effort is normal. No tachypnea, accessory muscle usage or respiratory distress.     Comments: + secretions Unable to protect airway Abdominal:     Palpations:  Abdomen is soft.  Neurological:     Mental Status: She is unresponsive.     Vital Signs: BP (!) 111/35   Pulse 85   Temp 98.3 F (36.8 C)   Resp 15   SpO2 100%  Pain Scale: 0-10   Pain Score: 5    SpO2: SpO2: 100 % O2 Device:SpO2: 100 % O2 Flow Rate: .   IO: Intake/output summary:   Intake/Output Summary (Last 24 hours) at 02/02/2019 1025 Last data filed at 02/02/2019 0845 Gross per 24 hour  Intake 253 ml  Output -  Net 253 ml    LBM:   Baseline Weight:   Most recent weight:       Palliative Assessment/Data:     Time In/out: 1020-1120, 1400-1420 Time Total: 80 min Greater than 50%  of this time was spent counseling and coordinating care related to the above assessment and plan.  Signed by: Vinie Sill, NP Palliative Medicine Team Pager # [phone] (M-F 8a-5p) Team Phone # [phone] (Nights/Weekends)

## 2019-02-02 NOTE — ED Notes (Signed)
Dr. Roxanne Mins informed of increasing BP informed to continue to monitor

## 2019-02-02 NOTE — ED Notes (Addendum)
EOL education provided to family. RN explained that secretions at EOL are normal due to pt losing ability to swallow which is an EOL phenomenon seen with every person. She is not choking on saliva- sound is air moving through spit. Reinforced that if her face appears relaxed, then she is likely comfortable despite noises that she may make- dying process rarely quiet. Reinforced that patient is having mottling and coolness to left upper extremity and this is also expected due to blood shunting to important organs. Provided teaching that she can likely still hear them and ok to talk to her and hold her hand. Lower extremities are warm at this time.

## 2019-02-02 NOTE — ED Notes (Signed)
COVID Swab recollected by Esther Hardy RN

## 2019-02-02 NOTE — ED Notes (Signed)
Sandra Merritt [phone] pts daughter

## 2019-02-02 NOTE — ED Notes (Signed)
Palliative NP at bedside speaking with family members.

## 2019-02-02 NOTE — ED Notes (Addendum)
ED TO INPATIENT HANDOFF REPORT  ED Nurse Name and Phone #:  Josede Cicero RN [phone]  S Name/Age/Gender Sandra Merritt 85 y.o. female Room/Bed: 031C/031C  Code Status   Code Status: Full Code  Home/SNF/Other Home Patient oriented to: self, place, time and situation Is this baseline? Yes   Triage Complete: Triage complete  Chief Complaint Weakness, R knee pain  Triage Note Pt here from home with c/o gen weakness and right knee pain , family was unable to to get her in the car to take to the MD so called ambulance for transport    Allergies Allergies  Allergen Reactions  . Milk-Related Compounds Other (See Comments)    cough  . Penicillins Hives and Rash    Did it involve swelling of the face/tongue/throat, SOB, or low BP? No Did it involve sudden or severe rash/hives, skin peeling, or any reaction on the inside of your mouth or nose? No Did you need to seek medical attention at a hospital or doctor's office? No When did it last happen?years If all above answers are "NO", may proceed with cephalosporin use.     Level of Care/Admitting Diagnosis ED Disposition    ED Disposition Condition [facility] Area: [place] [[id number]]  Level of Care: Progressive [102]  I expect the patient will be discharged within 24 hours: Yes  LOW acuity---Tx typically complete <24 hrs---ACUTE conditions typically can be evaluated <24 hours---LABS likely to return to acceptable levels <24 hours---IS near functional baseline---EXPECTED to return to current living arrangement---NOT newly hypoxic: Does not meet criteria for 5C-Observation unit  Covid Evaluation: Screening Protocol (No Symptoms)  Diagnosis: Brainstem tumor [facility]) [[id number]]  Admitting Physician: Vianne Bulls [[id number]]  Attending Physician: Vianne Bulls [[id number]]  PT Class (Do Not Modify): Observation [104]  PT Acc Code (Do Not Modify): Observation [10022]       B Medical/Surgery  History Past Medical History:  Diagnosis Date  . Aortic stenosis    mild by echo 2017  . CKD (chronic kidney disease), stage III ([place])   . Colon polyps   . COPD (chronic obstructive pulmonary disease) ([place])   . Dyslipidemia   . Hypertension   . Hypothyroid   . LFTs abnormal    History of abnormal LFTs due to Fatty Liver  . Macular degeneration   . Memory loss   . Mitral regurgitation    mdoerate by echo 07/2015  . Obesity   . OSA (obstructive sleep apnea)    intolerant to CPAP  . Osteoarthritis   . Osteopenia   . Permanent atrial fibrillation    Chronic on coumadin  . Pulmonary HTN ([place])    moderate with PASP 97mmHg on echo 07/2015  . Right sided facial pain   . Vitamin D deficiency    Past Surgical History:  Procedure Laterality Date  . BREAST BIOPSY    . CHOLECYSTECTOMY    . KNEE SURGERY Right   . RIGHT HEART CATH N/A 12/15/2016   Procedure: Right Heart Cath;  Surgeon: Larey Dresser, MD;  Location: [place] [facility];  Service: Cardiovascular;  Laterality: N/A;  . RIGHT/LEFT HEART CATH AND CORONARY ANGIOGRAPHY N/A 11/11/2018   Procedure: RIGHT/LEFT HEART CATH AND CORONARY ANGIOGRAPHY;  Surgeon: Larey Dresser, MD;  Location: [place] [facility];  Service: Cardiovascular;  Laterality: N/A;  . TEE WITHOUT CARDIOVERSION N/A 11/11/2018   Procedure: TRANSESOPHAGEAL ECHOCARDIOGRAM (TEE);  Surgeon: Larey Dresser, MD;  Location: [facility] ENDOSCOPY;  Service: Cardiovascular;  Laterality: N/A;  CATH AFTER PROCEDURE     A IV Location/Drains/Wounds Patient Lines/Drains/Airways Status   Active Line/Drains/Airways    Name:   Placement date:   Placement time:   Site:   Days:   Peripheral IV 02/02/19 Left Forearm   02/02/19    0047    Forearm   less than 1   Sheath 12/15/16 Left Brachial;Venous   12/15/16    0952    Brachial;Venous   779          Intake/Output Last 24 hours  Intake/Output Summary (Last 24 hours) at 02/02/2019 0370 Last data filed at 02/02/2019 0225 Gross per 24  hour  Intake 3 ml  Output -  Net 3 ml    Labs/Imaging Results for orders placed or performed during the hospital encounter of 02/06/2019 (from the past 48 hour(s))  Basic metabolic panel     Status: Abnormal   Collection Time: 02/14/2019  2:40 PM  Result Value Ref Range   Sodium 142 135 - 145 mmol/L   Potassium 3.7 3.5 - 5.1 mmol/L   Chloride 100 98 - 111 mmol/L   CO2 32 22 - 32 mmol/L   Glucose, Bld 122 (H) 70 - 99 mg/dL   BUN 28 (H) 8 - 23 mg/dL   Creatinine, Ser 1.32 (H) 0.44 - 1.00 mg/dL   Calcium 9.4 8.9 - 10.3 mg/dL   GFR calc non Af Amer 37 (L) >60 mL/min   GFR calc Af Amer 43 (L) >60 mL/min   Anion gap 10 5 - 15    Comment: Performed at [facility], [address] [place], [place] [address]  CBC     Status: Abnormal   Collection Time: 02/24/2019  2:40 PM  Result Value Ref Range   WBC 4.1 4.0 - 10.5 K/uL   RBC 3.04 (L) 3.87 - 5.11 MIL/uL   Hemoglobin 10.7 (L) 12.0 - 15.0 g/dL   HCT 33.5 (L) 36.0 - 46.0 %   MCV 110.2 (H) 80.0 - 100.0 fL   MCH 35.2 (H) 26.0 - 34.0 pg   MCHC 31.9 30.0 - 36.0 g/dL   RDW 15.6 (H) 11.5 - 15.5 %   Platelets 82 (L) 150 - 400 K/uL    Comment: REPEATED TO VERIFY PLATELET COUNT CONFIRMED BY SMEAR SPECIMEN CHECKED FOR CLOTS Immature Platelet Fraction may be clinically indicated, consider ordering this additional test [id number]    nRBC 0.0 0.0 - 0.2 %    Comment: Performed at [facility], [place] [address], [place], [place] [address]  Protime-INR     Status: Abnormal   Collection Time: 02/13/2019  3:30 PM  Result Value Ref Range   Prothrombin Time 24.7 (H) 11.4 - 15.2 seconds   INR 2.3 (H) 0.8 - 1.2    Comment: (NOTE) INR goal varies based on device and disease states. Performed at [facility], [place] [address] [place], [place] [address]   Brain natriuretic peptide     Status: Abnormal   Collection Time: 02/13/2019  3:30 PM  Result Value Ref Range   B Natriuretic Peptide 858.4 (H) 0.0 - 100.0 pg/mL    Comment: Performed at  [facility] [address] [place],  [address]  Type and screen [place]     Status: None   Collection Time: 02/10/2019  5:12 PM  Result Value Ref Range   ABO/RH(D) A POS    Antibody Screen NEG  Sample Expiration      02/04/2019,2359 Performed at [facility], [place] [address] [place], [place] [address]   ABO/Rh     Status: None   Collection Time: 01/31/2019  5:12 PM  Result Value Ref Range   ABO/RH(D)      A POS Performed at [facility] [place] [address], [place], [place] [address]   I-Stat Troponin, ED (not at [facility])     Status: None   Collection Time: 02/24/2019  5:14 PM  Result Value Ref Range   Troponin i, poc 0.03 0.00 - 0.08 ng/mL   Comment 3            Comment: Due to the release kinetics of cTnI, a negative result within the first hours of the onset of symptoms does not rule out myocardial infarction with certainty. If myocardial infarction is still suspected, repeat the test at appropriate intervals.   CBG monitoring, ED     Status: None   Collection Time: 02/13/2019  7:36 PM  Result Value Ref Range   Glucose-Capillary 87 70 - 99 mg/dL  Urinalysis, Routine w reflex microscopic     Status: None   Collection Time: 02/02/19 12:36 AM  Result Value Ref Range   Color, Urine YELLOW YELLOW   APPearance CLEAR CLEAR   Specific Gravity, Urine 1.009 1.005 - 1.030   pH 6.0 5.0 - 8.0   Glucose, UA NEGATIVE NEGATIVE mg/dL   Hgb urine dipstick NEGATIVE NEGATIVE   Bilirubin Urine NEGATIVE NEGATIVE   Ketones, ur NEGATIVE NEGATIVE mg/dL   Protein, ur NEGATIVE NEGATIVE mg/dL   Nitrite NEGATIVE NEGATIVE   Leukocytes,Ua NEGATIVE NEGATIVE    Comment: Performed at [facility] [address], [place], [place] [address]  SARS Coronavirus 2 (CEPHEID - Performed in [facility] hospital lab), Hosp Order     Status: None   Collection Time: 02/02/19  1:22 AM  Result Value Ref Range   SARS Coronavirus 2 NEGATIVE NEGATIVE    Comment:  (NOTE) If result is NEGATIVE SARS-CoV-2 target nucleic acids are NOT DETECTED. The SARS-CoV-2 RNA is generally detectable in upper and lower  respiratory specimens during the acute phase of infection. The lowest  concentration of SARS-CoV-2 viral copies this assay can detect is 250  copies / mL. A negative result does not preclude SARS-CoV-2 infection  and should not be used as the sole basis for treatment or other  patient management decisions.  A negative result may occur with  improper specimen collection / handling, submission of specimen other  than nasopharyngeal swab, presence of viral mutation(s) within the  areas targeted by this assay, and inadequate number of viral copies  (<250 copies / mL). A negative result must be combined with clinical  observations, patient history, and epidemiological information. If result is POSITIVE SARS-CoV-2 target nucleic acids are DETECTED. The SARS-CoV-2 RNA is generally detectable in upper and lower  respiratory specimens dur ing the acute phase of infection.  Positive  results are indicative of active infection with SARS-CoV-2.  Clinical  correlation with patient history and other diagnostic information is  necessary to determine patient infection status.  Positive results do  not rule out bacterial infection or co-infection with other viruses. If result is PRESUMPTIVE POSTIVE SARS-CoV-2 nucleic acids MAY BE PRESENT.   A presumptive positive result was obtained on the submitted specimen  and confirmed on repeat testing.  While 2019 novel coronavirus  (SARS-CoV-2) nucleic acids may be present in the submitted  sample  additional confirmatory testing may be necessary for epidemiological  and / or clinical management purposes  to differentiate between  SARS-CoV-2 and other Sarbecovirus currently known to infect humans.  If clinically indicated additional testing with an alternate test  methodology [phone]) is advised. The SARS-CoV-2 RNA is  generally  detectable in upper and lower respiratory sp ecimens during the acute  phase of infection. The expected result is Negative. Fact Sheet for Patients:  StrictlyIdeas.no Fact Sheet for Healthcare Providers: BankingDealers.co.za This test is not yet approved or cleared by the [place] FDA and has been authorized for detection and/or diagnosis of SARS-CoV-2 by FDA under an Emergency Use Authorization (EUA).  This EUA will remain in effect (meaning this test can be used) for the duration of the COVID-19 declaration under Section 564(b)(1) of the Act, 21 U.S.C. section 360bbb-3(b)(1), unless the authorization is terminated or revoked sooner. Performed at [facility], [place] [address] [place], [place] [address]    Dg Knee 2 Views Right  Result Date: 02/20/2019 CLINICAL DATA:  [place].  Right knee pain. EXAM: RIGHT KNEE - 1-2 VIEW COMPARISON:  MRI 01/15/2006 FINDINGS: Advanced tricompartmental degenerative changes most significant in the lateral compartment with joint space narrowing and osteophytic spurring. Fairly extensive chondrocalcinosis is noted. No acute fracture is identified. There is a small to moderate-sized joint effusion. IMPRESSION: 1. Advanced tricompartmental degenerative changes and chondrocalcinosis. 2. No definite acute fracture. 3. Moderate-sized joint effusion. Electronically Signed   By: Marijo Sanes M.D.   On: 02/08/2019 16:45   Ct Head Wo Contrast  Result Date: 02/07/2019 CLINICAL DATA:  Generalized weakness, altered level of consciousness EXAM: CT HEAD WITHOUT CONTRAST TECHNIQUE: Contiguous axial images were obtained from the base of the skull through the vertex without intravenous contrast. COMPARISON:  06/13/2016, CT 07/17/2014 FINDINGS: Brain: There is a rounded area noted within the right side of the brainstem which is slightly hyperdense with higher rim density surrounding it. This is new since prior CT  from 2015. Suggestion of vascular maceration on prior MRI in the adjacent cistern. Therefore, I favor this represents hemorrhage although underlying metastasis/mass lesion cannot be excluded. Recommend MRI for further evaluation. No additional abnormality. No hydrocephalus. No mass effect or midline shift. Vascular: No hyperdense vessel or unexpected calcification. Skull: No acute calvarial abnormality. Sinuses/Orbits: Visualized paranasal sinuses and mastoids clear. Orbital soft tissues unremarkable. Other: None IMPRESSION: Hyperdense area in the right side of the brainstem concerning for hemorrhage or mass lesion/metastasis. Recommend further evaluation with MRI with and without contrast. Electronically Signed   By: Rolm Baptise M.D.   On: 02/02/2019 16:56   Mr Brain W And Wo Contrast  Result Date: 02/02/2019 CLINICAL DATA:  Weakness and ataxia.  Abnormal head CT. EXAM: MRI HEAD WITHOUT AND WITH CONTRAST TECHNIQUE: Multiplanar, multiecho pulse sequences of the brain and surrounding structures were obtained without and with intravenous contrast. CONTRAST:  10 mL Gadavist COMPARISON:  Head CT 02/22/2019 Brain MRI 06/13/2016 FINDINGS: Brain: There is a lesion within the right pons with low T1 and high T2-weighted intrinsic signal. There is anterior contrast enhancement within the lesion. The size of the lesion is 1.4 x 1.1 x 1.3 cm. There are no other contrast-enhancing lesions within the brain. There is no acute ischemia. There is mild edema surrounding the lesion extending into the right cerebral peduncle and right middle cerebellar peduncle. There is peripheral hemorrhage surrounding the lesion. There is also chronic microhemorrhage in the peripheral left frontal lobe Vascular: Normal flow  voids. Skull and upper cervical spine: Normal marrow signal. Sinuses/Orbits: Paranasal sinuses and mastoid air cells are clear. Normal orbits. IMPRESSION: 1. Contrast-enhancing mass within the right pons with surrounding  edema extending into the cerebral and middle cerebellar peduncles. Thin rim of peripheral blood products. Primary differential considerations include hemangioblastoma and pontine glioma. This would be an unusual location for a solitary intracranial metastasis. 2. No acute infarct. Electronically Signed   By: Ulyses Jarred M.D.   On: 02/02/2019 00:39   Dg Chest Port 1 View  Result Date: 03/01/2019 CLINICAL DATA:  [place]. EXAM: PORTABLE CHEST 1 VIEW COMPARISON:  Chest x-ray 11/30/2017 FINDINGS: The heart is mildly enlarged but stable. Stable tortuosity and calcification of the thoracic aorta. Very low lung volumes with vascular crowding and streaky bibasilar atelectasis. No definite pleural effusions. No pneumothorax. The bony thorax is intact. No obvious rib fractures. Both shoulders are normally located. IMPRESSION: Very low lung volumes with vascular crowding and bibasilar atelectasis. Intact bony thorax. Electronically Signed   By: Marijo Sanes M.D.   On: 02/27/2019 16:43   Dg Shoulder Left  Result Date: 02/06/2019 CLINICAL DATA:  [place].  Left shoulder pain. EXAM: LEFT SHOULDER - 2+ VIEW COMPARISON:  None. FINDINGS: Moderate glenohumeral and AC joint degenerative changes with joint space narrowing and spurring. No acute fracture or dislocation. IMPRESSION: Degenerative changes but no fracture or dislocation. Electronically Signed   By: Marijo Sanes M.D.   On: 02/15/2019 16:46    Pending Labs Unresulted Labs (From admission, onward)    Start     Ordered   02/02/19 3382  Basic metabolic panel  Tomorrow morning,   R     02/02/19 0145   02/02/19 0500  CBC WITH DIFFERENTIAL  Tomorrow morning,   R     02/02/19 0145          Vitals/Pain Today's Vitals   02/02/19 0200 02/02/19 0224 02/02/19 0230 02/02/19 0300  BP: 101/78  (!) 80/49 (!) 86/43  Pulse: (!) 154  68 80  Resp: 18  (!) 21 (!) 28  Temp:      SpO2: (!) 86%  93% 90%  PainSc:  8       Isolation Precautions No active  isolations  Medications Medications  furosemide (LASIX) tablet 80 mg (has no administration in time range)  pravastatin (PRAVACHOL) tablet 40 mg (has no administration in time range)  metoprolol succinate (TOPROL-XL) 24 hr tablet 25 mg (has no administration in time range)  levothyroxine (SYNTHROID) tablet 150 mcg (has no administration in time range)  gabapentin (NEURONTIN) capsule 100 mg (has no administration in time range)  sodium chloride flush (NS) 0.9 % injection 3 mL (3 mLs Intravenous Not Given 02/02/19 0226)  sodium chloride flush (NS) 0.9 % injection 3 mL (3 mLs Intravenous Given 02/02/19 0225)  sodium chloride flush (NS) 0.9 % injection 3 mL (3 mLs Intravenous Given 02/02/19 0225)  0.9 %  sodium chloride infusion (has no administration in time range)  acetaminophen (TYLENOL) tablet 650 mg (has no administration in time range)    Or  acetaminophen (TYLENOL) suppository 650 mg (has no administration in time range)  HYDROcodone-acetaminophen (NORCO/VICODIN) 5-325 MG per tablet 1 tablet (has no administration in time range)  polyethylene glycol (MIRALAX / GLYCOLAX) packet 17 g (has no administration in time range)  ondansetron (ZOFRAN) tablet 4 mg (has no administration in time range)    Or  ondansetron (ZOFRAN) injection 4 mg (has no administration in time range)  fentaNYL (SUBLIMAZE)  injection 25 mcg (has no administration in time range)  dexamethasone (DECADRON) injection 4 mg (has no administration in time range)  gadobutrol (GADAVIST) 1 MMOL/ML injection 10 mL (10 mLs Intravenous Contrast Given 02/02/19 0022)  dexamethasone (DECADRON) injection 10 mg (10 mg Intravenous Given 02/02/19 0145)    Mobility walks with device High fall risk   Focused Assessments Neuro Assessment Handoff:  Swallow screen pass? pending Cardiac Rhythm: Atrial fibrillation       Neuro Assessment:   Neuro Checks:      Last Documented NIHSS Modified Score:   Has TPA been given? No If patient is a  Neuro Trauma and patient is going to OR before floor call report to [place] nurse: [phone] or [phone]     R Recommendations: See Admitting Provider Note  Report given to:   Additional Notes: Blood Pressure trends low  WEAKNESS Pt. Brought in to ED c/o general weakness. Weakness prominently on left side. Limited Movement of RUE and RLE.   PAIN Pt c/o of pain 8 on scale 0-10 of right knee, Xray Negative fracture. Decadron and Norco Given. Pt. Able to lift leg off bed and bend leg.   NEURO Abnormal CT hyperdense area right side of brain steam Abnormal MRI: mass of right pons with edema Passed stroke swallow screen.  NIH 3  Pupils: 2 brisk, PERRLA   MUSCULOSKELETAL  Normal Xray  Limited movement RUE/RLE, pain in right knee PERIPHERAL VASCULAR Elevated BNP  Edema   RESPIRATORY  Chest X-ray: Lower lung Bibasilar atelectasis  Lung sounds clear, Lower Lungs diminished.

## 2019-02-02 NOTE — Consult Note (Signed)
Neurosurgery Consultation  Reason for Consult: Brainstem mass Referring Physician: Regalado  CC: Obtundation  HPI: This is a 85 y.o. woman with an extensive PMHx that came to the ED last night with progressive weakness. She is currently obtunded and unable to provide me any further history. From review of her EMR records, she had dizziness over the past few days that progressed to LLE weakness that resulted in a fall, prompting her to go to the ED. Of note, recent use of anti-platelet or anti-coagulant medications.   ROS: A 14 point ROS was performed and is negative except as noted in the HPI but is limited due to patient's poor exam   PMHx:  Past Medical History:  Diagnosis Date  . Aortic stenosis    mild by echo 2017  . CKD (chronic kidney disease), stage III ([place])   . Colon polyps   . COPD (chronic obstructive pulmonary disease) ([place])   . Dyslipidemia   . Hypertension   . Hypothyroid   . LFTs abnormal    History of abnormal LFTs due to Fatty Liver  . Macular degeneration   . Memory loss   . Mitral regurgitation    mdoerate by echo 07/2015  . Obesity   . OSA (obstructive sleep apnea)    intolerant to CPAP  . Osteoarthritis   . Osteopenia   . Permanent atrial fibrillation    Chronic on coumadin  . Pulmonary HTN ([place])    moderate with PASP 68mmHg on echo 07/2015  . Right sided facial pain   . Vitamin D deficiency    FamHx:  Family History  Problem Relation Age of Onset  . Heart disease Mother   . Heart disease Father   . Heart disease Sister   . Diabetes Sister    SocHx:  reports that she has never smoked. She has never used smokeless tobacco. She reports that she does not drink alcohol or use drugs.  Exam: Vital signs in last 24 hours: Temp:  [98.3 F (36.8 C)] 98.3 F (36.8 C) (06/02 1432) Pulse Rate:  [52-154] 85 (06/03 0915) Resp:  [11-30] 19 (06/03 0915) BP: (80-142)/(40-123) 101/50 (06/03 0915) SpO2:  [85 %-99 %] 99 % (06/03 0915) General: Lying in  bed, obtunded, appears acutely ill Head: normocephalic and atruamatic HEENT: neck supple Pulmonary: breathing supplemental O2 with increased use of accessory muscles Cardiac: irregularly irregular Abdomen: S NT ND Extremities: warm and well perfused x4 Neuro: Eyes closed to stim, pupils pinpoint and non-reactive, right downgaze likely CN4 palsy, no vestibulo-ocular reflexes, minimal corneal reflex on the left, no corneal reflex on the right, did not check cough/gag due to risk of aspiration LUE extends, LLE w/d, RUE min w/d, RLE w/d  Assessment and Plan: 85 y.o. woman with imbalance that progressed to weakness and is now obtunded, PMHx w/ MMP including warfarin for Afib w/ a prior cardiac thrombus. [facility] personally reviewed, which shows a right pontine isodense center with hyperdense rim.  MRI w/wo reviewed, which shows a T2 hyperintense mass that is FLAIR hypointense, +susceptibility artifact c/w Hb vs Ca, T1 hypointense with homogenous central contrast enhancement. DDx is broad for this atypical lesion, when combining radiographic findings with reported clinical course, likely a mass with acute hemorrhage that caused acute worsening.   -no neurosurgical intervention indicated -discussed w/ care team (IM & CCM), CCM spoke w/ family, who wished to proceed with DNR/comfort care, which I strongly agree with given the location, poor exam, patient's age and co-morbidities, etc.  Marcello Moores  Quinn Plowman, MD 02/02/19 9:27 AM [facility]

## 2019-02-02 NOTE — Progress Notes (Signed)
Pt admitted to 5W13. Pt is currently comfort care, DNR. Unable to follow commands, responds to painful stimuli. Congested lung sounds. Scheduled to get [place]. VS stable. Family allowed at bedside. All questions/concerns addressed. Call bell within reach. Will continue to monitor.

## 2019-02-02 NOTE — ED Provider Notes (Signed)
Care assumed from Dr. Francia Greaves, patient with weakness and abnormal CT, pending MRI scan.  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  Case was discussed with neurosurgery who felt that she could be seen electively in the office.  However, given her degree of weakness, it was felt that she was not safe for discharge.  Case is discussed with Dr. Myna Hidalgo of [facility], who agrees to admit the patient.  Remaining labs include stable macrocytic anemia, mild renal insufficiency.  COVID-19 is pending.  Results for orders placed or performed during the hospital encounter of 09/60/45  Basic metabolic panel  Result Value Ref Range   Sodium 142 135 - 145 mmol/L   Potassium 3.7 3.5 - 5.1 mmol/L   Chloride 100 98 - 111 mmol/L   CO2 32 22 - 32 mmol/L   Glucose, Bld 122 (H) 70 - 99 mg/dL   BUN 28 (H) 8 - 23 mg/dL   Creatinine, Ser 1.32 (H) 0.44 - 1.00 mg/dL   Calcium 9.4 8.9 - 10.3 mg/dL   GFR calc non Af Amer 37 (L) >60 mL/min   GFR calc Af Amer 43 (L) >60 mL/min   Anion gap 10 5 - 15  CBC  Result Value Ref Range   WBC 4.1 4.0 - 10.5 K/uL   RBC 3.04 (L) 3.87 - 5.11 MIL/uL   Hemoglobin 10.7 (L) 12.0 - 15.0 g/dL   HCT 33.5 (L) 36.0 - 46.0 %   MCV 110.2 (H) 80.0 - 100.0 fL   MCH 35.2 (H) 26.0 - 34.0 pg   MCHC 31.9 30.0 - 36.0 g/dL   RDW 15.6 (H) 11.5 - 15.5 %   Platelets 82 (L) 150 - 400 K/uL   nRBC 0.0 0.0 - 0.2 %  Urinalysis, Routine w reflex microscopic  Result Value Ref Range   Color, Urine YELLOW YELLOW   APPearance CLEAR CLEAR   Specific Gravity, Urine 1.009 1.005 - 1.030   pH 6.0 5.0 - 8.0   Glucose, UA NEGATIVE NEGATIVE mg/dL   Hgb urine dipstick NEGATIVE NEGATIVE   Bilirubin Urine NEGATIVE NEGATIVE   Ketones, ur NEGATIVE NEGATIVE mg/dL   Protein, ur NEGATIVE NEGATIVE mg/dL   Nitrite NEGATIVE NEGATIVE   Leukocytes,Ua NEGATIVE NEGATIVE  Protime-INR  Result Value Ref Range   Prothrombin Time 24.7 (H) 11.4 - 15.2 seconds   INR 2.3 (H) 0.8 - 1.2  Brain  natriuretic peptide  Result Value Ref Range   B Natriuretic Peptide 858.4 (H) 0.0 - 100.0 pg/mL  CBG monitoring, ED  Result Value Ref Range   Glucose-Capillary 87 70 - 99 mg/dL  I-Stat Troponin, ED (not at [facility])  Result Value Ref Range   Troponin i, poc 0.03 0.00 - 0.08 ng/mL   Comment 3          Type and screen [place]  Result Value Ref Range   ABO/RH(D) A POS    Antibody Screen NEG    Sample Expiration      02/04/2019,2359 Performed at [facility], [address] [place], [place] [address]   ABO/Rh  Result Value Ref Range   ABO/RH(D)      A POS Performed at [facility] [address], [place], [place] [address]    Dg Knee 2 Views Right  Result Date: 02/07/2019 CLINICAL DATA:  [place].  Right knee pain. EXAM: RIGHT KNEE - 1-2 VIEW COMPARISON:  MRI 01/15/2006 FINDINGS: Advanced tricompartmental degenerative changes most significant in the  lateral compartment with joint space narrowing and osteophytic spurring. Fairly extensive chondrocalcinosis is noted. No acute fracture is identified. There is a small to moderate-sized joint effusion. IMPRESSION: 1. Advanced tricompartmental degenerative changes and chondrocalcinosis. 2. No definite acute fracture. 3. Moderate-sized joint effusion. Electronically Signed   By: Marijo Sanes M.D.   On: 02/25/2019 16:45   Ct Head Wo Contrast  Result Date: 02/17/2019 CLINICAL DATA:  Generalized weakness, altered level of consciousness EXAM: CT HEAD WITHOUT CONTRAST TECHNIQUE: Contiguous axial images were obtained from the base of the skull through the vertex without intravenous contrast. COMPARISON:  06/13/2016, CT 07/17/2014 FINDINGS: Brain: There is a rounded area noted within the right side of the brainstem which is slightly hyperdense with higher rim density surrounding it. This is new since prior CT from 2015. Suggestion of vascular maceration on prior MRI in the adjacent cistern. Therefore, I favor this represents  hemorrhage although underlying metastasis/mass lesion cannot be excluded. Recommend MRI for further evaluation. No additional abnormality. No hydrocephalus. No mass effect or midline shift. Vascular: No hyperdense vessel or unexpected calcification. Skull: No acute calvarial abnormality. Sinuses/Orbits: Visualized paranasal sinuses and mastoids clear. Orbital soft tissues unremarkable. Other: None IMPRESSION: Hyperdense area in the right side of the brainstem concerning for hemorrhage or mass lesion/metastasis. Recommend further evaluation with MRI with and without contrast. Electronically Signed   By: Rolm Baptise M.D.   On: 03/01/2019 16:56   Mr Brain W And Wo Contrast  Result Date: 02/02/2019 CLINICAL DATA:  Weakness and ataxia.  Abnormal head CT. EXAM: MRI HEAD WITHOUT AND WITH CONTRAST TECHNIQUE: Multiplanar, multiecho pulse sequences of the brain and surrounding structures were obtained without and with intravenous contrast. CONTRAST:  10 mL Gadavist COMPARISON:  Head CT 02/11/2019 Brain MRI 06/13/2016 FINDINGS: Brain: There is a lesion within the right pons with low T1 and high T2-weighted intrinsic signal. There is anterior contrast enhancement within the lesion. The size of the lesion is 1.4 x 1.1 x 1.3 cm. There are no other contrast-enhancing lesions within the brain. There is no acute ischemia. There is mild edema surrounding the lesion extending into the right cerebral peduncle and right middle cerebellar peduncle. There is peripheral hemorrhage surrounding the lesion. There is also chronic microhemorrhage in the peripheral left frontal lobe Vascular: Normal flow voids. Skull and upper cervical spine: Normal marrow signal. Sinuses/Orbits: Paranasal sinuses and mastoid air cells are clear. Normal orbits. IMPRESSION: 1. Contrast-enhancing mass within the right pons with surrounding edema extending into the cerebral and middle cerebellar peduncles. Thin rim of peripheral blood products. Primary  differential considerations include hemangioblastoma and pontine glioma. This would be an unusual location for a solitary intracranial metastasis. 2. No acute infarct. Electronically Signed   By: Ulyses Jarred M.D.   On: 02/02/2019 00:39   Dg Chest Port 1 View  Result Date: 02/02/2019 CLINICAL DATA:  [place]. EXAM: PORTABLE CHEST 1 VIEW COMPARISON:  Chest x-ray 11/30/2017 FINDINGS: The heart is mildly enlarged but stable. Stable tortuosity and calcification of the thoracic aorta. Very low lung volumes with vascular crowding and streaky bibasilar atelectasis. No definite pleural effusions. No pneumothorax. The bony thorax is intact. No obvious rib fractures. Both shoulders are normally located. IMPRESSION: Very low lung volumes with vascular crowding and bibasilar atelectasis. Intact bony thorax. Electronically Signed   By: Marijo Sanes M.D.   On: 02/17/2019 16:43   Dg Shoulder Left  Result Date: 02/18/2019 CLINICAL DATA:  [place].  Left shoulder pain. EXAM: LEFT SHOULDER - 2+ VIEW  COMPARISON:  None. FINDINGS: Moderate glenohumeral and AC joint degenerative changes with joint space narrowing and spurring. No acute fracture or dislocation. IMPRESSION: Degenerative changes but no fracture or dislocation. Electronically Signed   By: Marijo Sanes M.D.   On: 02/27/2019 16:46    Images viewed by me.    Delora Fuel, MD 36/46/80 [phone]

## 2019-02-02 NOTE — Progress Notes (Signed)
PROGRESS NOTE    Sandra Merritt  BSW:[id number] DOB: 04/20/1932 DOA: 02/10/2019 PCP: Jonathon Jordan, MD    Brief Narrative: Sandra Merritt is a 85 y.o. 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  Patient reports that she developed muscle weakness and some dizziness over the past few days that has rapidly worsened.  She felt as though her left leg was very weak yesterday, resulting in a fall.  She denies hitting her head or losing consciousness, but has been having pain at her left shoulder and right knee after this.  She denies any change in vision or hearing, but has had some mild headaches.  Denies fevers or chills, and denies any new cough.  ED Course: Upon arrival to the ED, patient is found to be afebrile, saturating well on room air, and with blood pressure 90/50.  EKG features atrial fibrillation.  Chest x-ray is notable for low lung volumes with vascular crowding and bibasilar atelectasis.  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  Radiographs of the left shoulder and right knee are negative for acute fracture or dislocation.  Chemistry panel features a creatinine 1.32, up from an apparent baseline of 1.1.  CBC is notable for chronic stable microcytic anemia and thrombocytopenia.  INR is therapeutic at 2.3, troponin normal, and BNP elevated to 858.  Neurosurgery was consulted by the ED physician and the patient was treated with 10 mg IV Decadron in the ED.   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 Received a very low dose Vicodin last night. I have given her Narcan time 2. No response. I have ordered ABG and repeat CT head.  I have contacted neurosurgery and inform them change in patient condition.  I have also consulted CCM due to concern for need for intubation. I have informed family patient status change and plan for neurosurgical evaluation, CCM evaluation, blood gas. -Dr. Unknown Jim discussed with family, and was made DNR.  Have consulted palliative care for further goals of cares and symptom management, family support. -Discussed with neurosurgery, hold on reversing INR due to patient prior history of atrial  thrombus.  He think patient probably had mass with acute hemorrhage.  He think patient is not a surgical candidate.  -On IV Decadron.Marland Kitchen  2-Permanent A fib; history of atrial  thrombus Hold Coumadin, due to #1.  3-Acute encephalopathy; related to #1  4-hypothyroidism: On Synthroid. 5-aortic stenosis;  6-microcytic anemia thrombocytopenia chronic 7-chronic kidney disease a stage III   Estimated body mass index is 36.56 kg/m as calculated from the following:   Height as of 12/24/18: 5\' 4"  (1.626 m).   Weight as of 01/11/19: 96.6 kg.   DVT prophylaxis: SCDs Code Status: Now DNR Family Communication: Family over phone Disposition Plan: Suspect hospital death  Consultants:  Neurosurgery  CCM  Palliative   Procedures:   none   Antimicrobials:   none   Subjective: Obtunded, lethargic not following command.   Objective: Vitals:   02/02/19 0715 02/02/19 0748 02/02/19 0800 02/02/19 0815  BP: (!) 97/42 (!) 93/50 (!) 91/57 (!) 96/49  Pulse: 78 86 89 86  Resp: 14  14 14 14   Temp:      SpO2:  98% 97% 98%    Intake/Output Summary (Last 24 hours) at 02/02/2019 0846 Last data filed at 02/02/2019 0845 Gross per 24 hour  Intake 253 ml  Output -  Net 253 ml   There were no vitals filed for this visit.  Examination:  General exam: obtunded.  Respiratory system: CTA Cardiovascular system: S1 & S2 heard, RRR. No JVD, murmurs, rubs, gallops or clicks. No pedal edema. Gastrointestinal system: Abdomen is nondistended, soft and nontender. No organomegaly or masses felt. Normal bowel sounds heard. Central nervous system: non responsive, obtunded.  Extremities: no edema Skin: No rashes, lesions or ulcers    Data Reviewed: I have personally reviewed following labs and imaging studies  CBC: Recent Labs  Lab 02/07/2019 1440 02/02/19 0337  WBC 4.1 4.2  NEUTROABS  --  3.3  HGB 10.7* 10.4*  HCT 33.5* 31.9*  MCV 110.2* 109.6*  PLT 82* 79*   Basic Metabolic Panel: Recent Labs  Lab 02/21/2019 1440 02/02/19 0337  NA 142 144  K 3.7 3.8  CL 100 101  CO2 32 31  GLUCOSE 122* 105*  BUN 28* 25*  CREATININE 1.32* 1.09*  CALCIUM 9.4 9.4   GFR: CrCl cannot be calculated (Unknown ideal weight.). Liver Function Tests: No results for input(s): AST, ALT, ALKPHOS, BILITOT, PROT, ALBUMIN in the last 168 hours. No results for input(s): LIPASE, AMYLASE in the last 168 hours. No results for input(s): AMMONIA in the last 168 hours. Coagulation Profile: Recent Labs  Lab 02/09/2019 1530  INR 2.3*   Cardiac Enzymes: No results for input(s): CKTOTAL, CKMB, CKMBINDEX, TROPONINI in the last 168 hours. BNP (last 3 results) No results for input(s): PROBNP in the last 8760 hours. HbA1C: No results for input(s): HGBA1C in the last 72 hours. CBG: Recent Labs  Lab 02/28/2019 1936 02/02/19 0743  GLUCAP 87 122*   Lipid Profile: No results for input(s): CHOL, HDL, LDLCALC, TRIG, CHOLHDL, LDLDIRECT in the last 72 hours. Thyroid Function Tests: No results for  input(s): TSH, T4TOTAL, FREET4, T3FREE, THYROIDAB in the last 72 hours. Anemia Panel: No results for input(s): VITAMINB12, FOLATE, FERRITIN, TIBC, IRON, RETICCTPCT in the last 72 hours. Sepsis Labs: No results for input(s): PROCALCITON, LATICACIDVEN in the last 168 hours.  Recent Results (from the past 240 hour(s))  SARS Coronavirus 2 (CEPHEID - Performed in [place] hospital lab), Hosp Order     Status: None   Collection Time: 02/02/19  1:22 AM  Result Value Ref Range Status   SARS Coronavirus 2 NEGATIVE NEGATIVE Final    Comment: (NOTE) If result is NEGATIVE SARS-CoV-2 target nucleic acids are NOT DETECTED. The SARS-CoV-2 RNA is generally detectable in upper and lower  respiratory specimens during the acute phase of infection. The lowest  concentration of SARS-CoV-2 viral copies this assay can detect is 250  copies / mL. A negative result does not preclude SARS-CoV-2 infection  and should not be used as the sole basis for treatment or other  patient management decisions.  A negative result may occur with  improper specimen collection / handling, submission of specimen other  than nasopharyngeal swab, presence of viral mutation(s) within the  areas targeted by this assay, and inadequate number of viral copies  (<250 copies / mL). A negative result must be combined with clinical  observations, patient history, and epidemiological information. If result is POSITIVE SARS-CoV-2 target nucleic acids are DETECTED. The SARS-CoV-2 RNA is generally detectable in upper and lower  respiratory specimens dur ing the acute phase of infection.  Positive  results are indicative of active infection with SARS-CoV-2.  Clinical  correlation with patient history and other diagnostic information is  necessary to determine patient infection status.  Positive results do  not rule out bacterial infection or co-infection with other viruses. If result is PRESUMPTIVE POSTIVE SARS-CoV-2 nucleic acids MAY  BE PRESENT.   A presumptive positive result was obtained on the submitted specimen  and confirmed on repeat testing.  While 2019 novel coronavirus  (SARS-CoV-2) nucleic acids may be present in the submitted sample  additional confirmatory testing may be necessary for epidemiological  and / or clinical management purposes  to differentiate between  SARS-CoV-2 and other Sarbecovirus currently known to infect humans.  If clinically indicated additional testing with an alternate test  methodology [phone]) is advised. The SARS-CoV-2 RNA is generally  detectable in upper and lower respiratory sp ecimens during the acute  phase of infection. The expected result is Negative. Fact Sheet for Patients:  StrictlyIdeas.no Fact Sheet for Healthcare Providers: BankingDealers.co.za This test is not yet approved or cleared by the [place] FDA and has been authorized for detection and/or diagnosis of SARS-CoV-2 by FDA under an Emergency Use Authorization (EUA).  This EUA will remain in effect (meaning this test can be used) for the duration of the COVID-19 declaration under Section 564(b)(1) of the Act, 21 U.S.C. section 360bbb-3(b)(1), unless the authorization is terminated or revoked sooner. Performed at [facility], [place] [address], [place], [place] [address]          Radiology Studies: Dg Knee 2 Views Right  Result Date: 02/28/2019 CLINICAL DATA:  [place].  Right knee pain. EXAM: RIGHT KNEE - 1-2 VIEW COMPARISON:  MRI 01/15/2006 FINDINGS: Advanced tricompartmental degenerative changes most significant in the lateral compartment with joint space narrowing and osteophytic spurring. Fairly extensive chondrocalcinosis is noted. No acute fracture is identified. There is a small to moderate-sized joint effusion. IMPRESSION: 1. Advanced tricompartmental degenerative changes and chondrocalcinosis. 2. No definite acute fracture. 3. Moderate-sized  joint effusion. Electronically Signed   By: Marijo Sanes M.D.   On: 02/07/2019 16:45   Ct Head Wo Contrast  Result Date: 02/07/2019 CLINICAL DATA:  Generalized weakness, altered level of consciousness EXAM: CT HEAD WITHOUT CONTRAST TECHNIQUE: Contiguous axial images were obtained from the base of the skull through the vertex without intravenous contrast. COMPARISON:  06/13/2016, CT 07/17/2014 FINDINGS: Brain: There is a rounded area noted within the right side of the brainstem which is slightly hyperdense with higher rim density surrounding it. This is new since prior CT from 2015. Suggestion of vascular maceration on prior MRI in the adjacent cistern. Therefore, I favor this represents hemorrhage although underlying metastasis/mass lesion cannot be excluded. Recommend MRI for further evaluation. No additional abnormality. No hydrocephalus. No mass effect or midline shift. Vascular: No hyperdense vessel or unexpected calcification. Skull: No acute calvarial abnormality. Sinuses/Orbits: Visualized paranasal sinuses and mastoids clear. Orbital soft tissues unremarkable. Other: None IMPRESSION: Hyperdense area in the right side of the brainstem concerning for hemorrhage or mass lesion/metastasis. Recommend further evaluation with MRI with  and without contrast. Electronically Signed   By: Rolm Baptise M.D.   On: 02/23/2019 16:56   Mr Brain W And Wo Contrast  Result Date: 02/02/2019 CLINICAL DATA:  Weakness and ataxia.  Abnormal head CT. EXAM: MRI HEAD WITHOUT AND WITH CONTRAST TECHNIQUE: Multiplanar, multiecho pulse sequences of the brain and surrounding structures were obtained without and with intravenous contrast. CONTRAST:  10 mL Gadavist COMPARISON:  Head CT 02/17/2019 Brain MRI 06/13/2016 FINDINGS: Brain: There is a lesion within the right pons with low T1 and high T2-weighted intrinsic signal. There is anterior contrast enhancement within the lesion. The size of the lesion is 1.4 x 1.1 x 1.3 cm. There are  no other contrast-enhancing lesions within the brain. There is no acute ischemia. There is mild edema surrounding the lesion extending into the right cerebral peduncle and right middle cerebellar peduncle. There is peripheral hemorrhage surrounding the lesion. There is also chronic microhemorrhage in the peripheral left frontal lobe Vascular: Normal flow voids. Skull and upper cervical spine: Normal marrow signal. Sinuses/Orbits: Paranasal sinuses and mastoid air cells are clear. Normal orbits. IMPRESSION: 1. Contrast-enhancing mass within the right pons with surrounding edema extending into the cerebral and middle cerebellar peduncles. Thin rim of peripheral blood products. Primary differential considerations include hemangioblastoma and pontine glioma. This would be an unusual location for a solitary intracranial metastasis. 2. No acute infarct. Electronically Signed   By: Ulyses Jarred M.D.   On: 02/02/2019 00:39   Dg Chest Port 1 View  Result Date: 02/11/2019 CLINICAL DATA:  [place]. EXAM: PORTABLE CHEST 1 VIEW COMPARISON:  Chest x-ray 11/30/2017 FINDINGS: The heart is mildly enlarged but stable. Stable tortuosity and calcification of the thoracic aorta. Very low lung volumes with vascular crowding and streaky bibasilar atelectasis. No definite pleural effusions. No pneumothorax. The bony thorax is intact. No obvious rib fractures. Both shoulders are normally located. IMPRESSION: Very low lung volumes with vascular crowding and bibasilar atelectasis. Intact bony thorax. Electronically Signed   By: Marijo Sanes M.D.   On: 02/09/2019 16:43   Dg Shoulder Left  Result Date: 02/02/2019 CLINICAL DATA:  [place].  Left shoulder pain. EXAM: LEFT SHOULDER - 2+ VIEW COMPARISON:  None. FINDINGS: Moderate glenohumeral and AC joint degenerative changes with joint space narrowing and spurring. No acute fracture or dislocation. IMPRESSION: Degenerative changes but no fracture or dislocation. Electronically Signed   By: Marijo Sanes M.D.   On: 02/23/2019 16:46        Scheduled Meds: . dexamethasone  4 mg Intravenous Q6H  . furosemide  80 mg Oral BID  . levothyroxine  150 mcg Oral QAC breakfast  . metoprolol succinate  25 mg Oral Daily  . naLOXone (NARCAN)  injection  0.4 mg Intravenous Once  . sodium chloride flush  3 mL Intravenous Q12H  . sodium chloride flush  3 mL Intravenous Q12H   Continuous Infusions: . sodium chloride       LOS: 0 days    Time spent: 35 minutes.     Elmarie Shiley, MD [facility] Pager [phone]  If 7PM-7AM, please contact night-coverage [email or web address] Password [facility] 02/02/2019, 8:46 AM

## 2019-02-02 NOTE — Progress Notes (Signed)
NAME:  JAQUANDA WICKERSHAM, MRN:  [id number], DOB:  1932/01/10, LOS: 0 ADMISSION DATE:  02/18/2019, CONSULTATION DATE:  02/02/2019 REFERRING MD:  [facility] - Regalado, CHIEF COMPLAINT:  AMA and acute respiratory failure   Brief History   85 year old female with extensive PMH who presents to PCCM with AMS.  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  PCCM was consulted for respiratory failure.  Patient was evaluated by neurosurgery and is deemed a non-operative candidate.  Patient is obtunded and unable to provide further history.    History of present illness   85 year old female with extensive PMH who presents to PCCM with AMS.  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  PCCM was consulted for respiratory failure.  Patient was evaluated by neurosurgery and is deemed a non-operative candidate.  Patient is obtunded and unable to provide further history.    Past Medical History  A-fib and CHF  Significant Hospital Events   6/3 admission to [facility]  Consults:  Neurosurgery PCCM  Procedures:  N/A  Significant Diagnostic Tests:  MRI of the brain that I reviewed myself with pontine bleed noted  Micro Data:  N/A  Antimicrobials:  N/A   Interim history/subjective:  Unresponsive  Objective   Blood pressure (!) 101/50, pulse 85, temperature 98.3 F (36.8 C), resp. rate 19, SpO2 99 %.        Intake/Output Summary (Last 24 hours) at 02/02/2019 0946 Last data filed at 02/02/2019 0845 Gross per 24 hour  Intake 253 ml  Output -  Net 253 ml   There were no vitals filed for this visit.  Examination: General: Acutely ill appearing female, NAD HENT: [place]/AT, PERRL, EOM-I and MMM Lungs: Transmitted upper airway sounds Cardiovascular: RRR, Nl S1/S2 and -M/R/G Abdomen: Soft, NT, ND and +BS Extremities: -edema and -tenderness Neuro: Unresponsive, withdraws ext to pain but not command Skin: Intact  Resolved Hospital Problem list   N/A  Assessment &  Plan:  85 year old female with respiratory failure due to inability to protect her airway due to a pontine bleed.  PCCM was consulted for intubation.  Per neurosurgery patient is not an operative candidate.  I had an extensive conversation with the daughters over the phone.  I was completely frank that patient's daughters, patient will not survive and placement on life support will only prolong the patient's suffering.  After discussion, decision was made to proceed with DNR status and after they visit with patient proceed to comfort.  Will make patient DNR and PCCM will sign off, please call back if needed.  Labs   CBC: Recent Labs  Lab 02/20/2019 1440 02/02/19 0337 02/02/19 0908  WBC 4.1 4.2  --   NEUTROABS  --  3.3  --   HGB 10.7* 10.4* 11.6*  HCT 33.5* 31.9* 34.0*  MCV 110.2* 109.6*  --   PLT 82* 79*  --     Basic Metabolic Panel: Recent Labs  Lab 02/25/2019 1440 02/02/19 0337 02/02/19 0908  NA 142 144 142  K 3.7 3.8 4.1  CL 100 101  --   CO2 32 31  --   GLUCOSE 122* 105*  --   BUN 28* 25*  --   CREATININE 1.32* 1.09*  --   CALCIUM 9.4 9.4  --    GFR: CrCl cannot be calculated (Unknown ideal weight.). Recent Labs  Lab 02/08/2019 1440 02/02/19 0337  WBC 4.1 4.2  Liver Function Tests: No results for input(s): AST, ALT, ALKPHOS, BILITOT, PROT, ALBUMIN in the last 168 hours. No results for input(s): LIPASE, AMYLASE in the last 168 hours. No results for input(s): AMMONIA in the last 168 hours.  ABG    Component Value Date/Time   PHART 7.362 02/02/2019 0908   PCO2ART 60.1 (H) 02/02/2019 0908   PO2ART 104.0 02/02/2019 0908   HCO3 34.1 (H) 02/02/2019 0908   TCO2 36 (H) 02/02/2019 0908   O2SAT 98.0 02/02/2019 0908     Coagulation Profile: Recent Labs  Lab 02/05/2019 1530 02/02/19 0844  INR 2.3* 2.3*    Cardiac Enzymes: No results for input(s): CKTOTAL, CKMB, CKMBINDEX, TROPONINI in the last 168 hours.  HbA1C: No results found for: HGBA1C  CBG: Recent Labs   Lab 02/18/2019 1936 02/02/19 0743  GLUCAP 87 122*    Review of Systems:   Unattainable  Past Medical History  She,  has a past medical history of Aortic stenosis, CKD (chronic kidney disease), stage III ([place]), Colon polyps, COPD (chronic obstructive pulmonary disease) ([place]), Dyslipidemia, Hypertension, Hypothyroid, LFTs abnormal, Macular degeneration, Memory loss, Mitral regurgitation, Obesity, OSA (obstructive sleep apnea), Osteoarthritis, Osteopenia, Permanent atrial fibrillation, Pulmonary HTN ([place]), Right sided facial pain, and Vitamin D deficiency.   Surgical History    Past Surgical History:  Procedure Laterality Date  . BREAST BIOPSY    . CHOLECYSTECTOMY    . KNEE SURGERY Right   . RIGHT HEART CATH N/A 12/15/2016   Procedure: Right Heart Cath;  Surgeon: Larey Dresser, MD;  Location: [facility];  Service: Cardiovascular;  Laterality: N/A;  . RIGHT/LEFT HEART CATH AND CORONARY ANGIOGRAPHY N/A 11/11/2018   Procedure: RIGHT/LEFT HEART CATH AND CORONARY ANGIOGRAPHY;  Surgeon: Larey Dresser, MD;  Location: [facility];  Service: Cardiovascular;  Laterality: N/A;  . TEE WITHOUT CARDIOVERSION N/A 11/11/2018   Procedure: TRANSESOPHAGEAL ECHOCARDIOGRAM (TEE);  Surgeon: Larey Dresser, MD;  Location: [facility] ENDOSCOPY;  Service: Cardiovascular;  Laterality: N/A;  CATH AFTER PROCEDURE     Social History   reports that she has never smoked. She has never used smokeless tobacco. She reports that she does not drink alcohol or use drugs.   Family History   Her family history includes Diabetes in her sister; Heart disease in her father, mother, and sister.   Allergies Allergies  Allergen Reactions  . Milk-Related Compounds Other (See Comments)    cough  . Penicillins Hives and Rash    Did it involve swelling of the face/tongue/throat, SOB, or low BP? No Did it involve sudden or severe rash/hives, skin peeling, or any reaction on the inside of your mouth or nose? No Did  you need to seek medical attention at a hospital or doctor's office? No When did it last happen?years If all above answers are "NO", may proceed with cephalosporin use.      Home Medications  Prior to Admission medications   Medication Sig Start Date End Date Taking? Authorizing Provider  acetaminophen (TYLENOL) 500 MG tablet Take 1,000 mg by mouth 3 (three) times daily.    Yes [provider]  alendronate (FOSAMAX) 70 MG tablet Take 70 mg by mouth every Wednesday.  07/12/15  Yes [provider]  Calcium Carb-Cholecalciferol (CALCIUM + D3) 600-200 MG-UNIT TABS Take 1 tablet by mouth 2 (two) times daily.    Yes [provider]  furosemide (LASIX) 40 MG tablet Take 80 mg by mouth 2 (two) times daily.   Yes [provider]  gabapentin (NEURONTIN) 100 MG capsule Take 100 mg by mouth 3 (three) times daily.  02/05/18  Yes [provider]  Glucosamine HCl (GLUCOSAMINE MAXIMUM STRENGTH PO) Take 1-2 tablets by mouth See admin instructions. Take 2 tablets in the morning and 1 tablet in the evening   Yes [provider]  levothyroxine (SYNTHROID, LEVOTHROID) 150 MCG tablet Take 150 mcg by mouth daily before breakfast.   Yes [provider]  lovastatin (MEVACOR) 40 MG tablet Take 40 mg by mouth at bedtime.   Yes [provider]  metoprolol succinate (TOPROL-XL) 25 MG 24 hr tablet Take 25 mg by mouth daily.   Yes [provider]  Multiple Vitamin (MULTIVITAMIN) tablet Take 1 tablet by mouth at bedtime.    Yes [provider]  Multiple Vitamins-Minerals (EYE VITAMINS PO) Take 1 capsule by mouth 2 (two) times daily.    Yes [provider]  Polyvinyl Alcohol-Povidone (REFRESH OP) Place 1 drop into both eyes daily as needed (dry eyes).    Yes [provider]  ramipril (ALTACE) 2.5 MG capsule Take 1 capsule (2.5 mg total) by mouth daily. 09/17/18  Yes Sueanne Margarita, MD  warfarin (COUMADIN) 5 MG tablet  Take 7.5 mg (1 and 1 half tab) 3/12, and then 5 mg (1 tab) daily until coumadin check. Patient taking differently: Take 1/2 tablet on Tuesday, Thursday and Saturday then take 1 tablet all the other days 11/11/18  Yes Tillery, Satira Mccallum, PA-C    The patient is critically ill with multiple organ systems failure and requires high complexity decision making for assessment and support, frequent evaluation and titration of therapies, application of advanced monitoring technologies and extensive interpretation of multiple databases.   Critical Care Time devoted to patient care services described in this note is  45  Minutes. This time reflects time of care of this signee Dr Jennet Maduro. This critical care time does not reflect procedure time, or teaching time or supervisory time of PA/NP/Med student/Med Resident etc but could involve care discussion time.  Rush Farmer, M.D. [facility] Pulmonary/Critical Care Medicine. Pager: [phone]. After hours pager: [phone].

## 2019-02-02 NOTE — ED Notes (Signed)
Upon entry into room to give pt morning meds, this RN noted the patient to be very lethargic and minimally responsive to painful stimuli. MNIHSS assessed, admitting MD paged.

## 2019-02-02 NOTE — H&P (Signed)
History and Physical    Sandra Merritt EXB:[id number] DOB: 1932-08-25 DOA: 02/23/2019  PCP: Jonathon Jordan, MD   Patient coming from: Home  Chief Complaint: Weakness, fall   HPI: Sandra Merritt is a 85 y.o. 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  Patient reports that she developed muscle weakness and some dizziness over the past few days that has rapidly worsened.  She felt as though her left leg was very weak yesterday, resulting in a fall.  She denies hitting her head or losing consciousness, but has been having pain at her left shoulder and right knee after this.  She denies any change in vision or hearing, but has had some mild headaches.  Denies fevers or chills, and denies any new cough.  ED Course: Upon arrival to the ED, patient is found to be afebrile, saturating well on room air, and with blood pressure 90/50.  EKG features atrial fibrillation.  Chest x-ray is notable for low lung volumes with vascular crowding and bibasilar atelectasis.  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  Radiographs of the left shoulder and right knee are negative for acute fracture or dislocation.  Chemistry panel features a creatinine 1.32, up from an apparent baseline of 1.1.  CBC is notable for chronic stable microcytic anemia and thrombocytopenia.  INR is therapeutic at 2.3, troponin normal, and BNP elevated to 858.  Neurosurgery was consulted by the ED physician and the patient was treated with 10 mg IV Decadron in the ED.  Review of Systems:  All other systems reviewed and apart from HPI, are negative.  Past Medical History:  Diagnosis Date  . Aortic stenosis    mild by echo 2017  . CKD (chronic kidney disease), stage III ([place])   . Colon polyps   . COPD (chronic obstructive pulmonary disease) ([place])   . Dyslipidemia   . Hypertension   . Hypothyroid   . LFTs abnormal    History of abnormal LFTs due to Fatty Liver  . Macular degeneration   . Memory loss   . Mitral regurgitation    mdoerate by echo 07/2015  . Obesity   . OSA (obstructive sleep apnea)    intolerant to CPAP  . Osteoarthritis   . Osteopenia   . Permanent atrial fibrillation    Chronic on coumadin  . Pulmonary HTN ([place])    moderate with PASP 45mHg on echo 07/2015  . Right sided facial pain   . Vitamin D deficiency     Past Surgical History:  Procedure Laterality Date  . BREAST BIOPSY    . CHOLECYSTECTOMY    . KNEE SURGERY Right   . RIGHT HEART CATH N/A 12/15/2016   Procedure: Right Heart Cath;  Surgeon: DLarey Dresser MD;  Location[facility];  Service: Cardiovascular;  Laterality: N/A;  . RIGHT/LEFT HEART CATH AND CORONARY ANGIOGRAPHY N/A 11/11/2018   Procedure: RIGHT/LEFT HEART CATH AND CORONARY ANGIOGRAPHY;  Surgeon: MLarey Dresser MD;  [facility];  Service: Cardiovascular;  Laterality: N/A;  . TEE WITHOUT CARDIOVERSION N/A  11/11/2018   Procedure: TRANSESOPHAGEAL ECHOCARDIOGRAM (TEE);  Surgeon: Larey Dresser, MD;  Location[facility];  Service: Cardiovascular;  Laterality: N/A;  CATH AFTER PROCEDURE     reports that she has never smoked. She has never used smokeless tobacco. She reports that she does not drink alcohol or use drugs.  Allergies  Allergen Reactions  . Milk-Related Compounds Other (See Comments)    cough  . Penicillins Hives and Rash    Did it involve swelling of the face/tongue/throat, SOB, or low BP? No Did it involve sudden or severe rash/hives, skin peeling, or any  reaction on the inside of your mouth or nose? No Did you need to seek medical attention at a hospital or doctor's office? No When did it last happen?years If all above answers are "NO", may proceed with cephalosporin use.     Family History  Problem Relation Age of Onset  . Heart disease Mother   . Heart disease Father   . Heart disease Sister   . Diabetes Sister      Prior to Admission medications   Medication Sig Start Date End Date Taking? Authorizing Provider  acetaminophen (TYLENOL) 500 MG tablet Take 1,000 mg by mouth 3 (three) times daily.    Yes [provider]  alendronate (FOSAMAX) 70 MG tablet Take 70 mg by mouth every Wednesday.  07/12/15  Yes [provider]  Calcium Carb-Cholecalciferol (CALCIUM + D3) 600-200 MG-UNIT TABS Take 1 tablet by mouth 2 (two) times daily.    Yes [provider]  furosemide (LASIX) 40 MG tablet Take 80 mg by mouth 2 (two) times daily.   Yes [provider]  gabapentin (NEURONTIN) 100 MG capsule Take 100 mg by mouth 3 (three) times daily.  02/05/18  Yes [provider]  Glucosamine HCl (GLUCOSAMINE MAXIMUM STRENGTH PO) Take 1-2 tablets by mouth See admin instructions. Take 2 tablets in the morning and 1 tablet in the evening   Yes [provider]  levothyroxine (SYNTHROID, LEVOTHROID) 150 MCG tablet Take 150 mcg by mouth daily before breakfast.   Yes [provider]  lovastatin (MEVACOR) 40 MG tablet Take 40 mg by mouth at bedtime.   Yes [provider]  metoprolol succinate (TOPROL-XL) 25 MG 24 hr tablet Take 25 mg by mouth daily.   Yes [provider]  Multiple Vitamin (MULTIVITAMIN) tablet Take 1 tablet by mouth at bedtime.    Yes [provider]  Multiple Vitamins-Minerals (EYE VITAMINS PO) Take 1 capsule by mouth 2 (two) times daily.    Yes [provider]  Polyvinyl Alcohol-Povidone (REFRESH OP) Place 1 drop into both eyes daily as needed  (dry eyes).    Yes [provider]  ramipril (ALTACE) 2.5 MG capsule Take 1 capsule (2.5 mg total) by mouth daily. 09/17/18  Yes Sueanne Margarita, MD  warfarin (COUMADIN) 5 MG tablet Take 7.5 mg (1 and 1 half tab) 3/12, and then 5 mg (1 tab) daily until coumadin check. Patient taking differently: Take 1/2 tablet on Tuesday, Thursday and Saturday then take 1 tablet all the other days 11/11/18  Yes Shirley Friar, PA-C    Physical Exam: Vitals:   02/02/19 0030 02/02/19 0045 02/02/19 0115 02/02/19 0146  BP: (!) 142/123 (!) 90/51 (!) 86/46 (!) 124/113  Pulse: 75 75 67 (!) 52  Resp: 16 (!) 21 20 (!) 23  Temp:      SpO2: 96% 96% 91% (!) 89%    Constitutional: NAD, tearful, anxious  Eyes: PERTLA, lids and conjunctivae normal ENMT: Mucous membranes are moist. Posterior pharynx clear of any exudate or lesions.   Neck: normal, supple, no masses, no thyromegaly Respiratory: no wheezing, no crackles. No accessory muscle use.  Cardiovascular: Rate ~80 and irregular. Pitting pretibial edema bilaterally. No significant JVD. Abdomen: No distension, no tenderness, soft. Bowel sounds normal.  Musculoskeletal: no clubbing / cyanosis. No joint deformity upper and lower extremities.   Skin: no significant rashes, lesions, ulcers. Warm, dry, well-perfused. Neurologic: CN 2-12 grossly intact. Sensation to light touch intact. Extremity weakness x4, worse on left.  Psychiatric: Alert and oriented to person, place, and situation. Tearful and anxious.    Labs on Admission: I have personally reviewed following labs and imaging studies  CBC: Recent Labs  Lab 02/21/2019 1440  WBC 4.1  HGB 10.7*  HCT 33.5*  MCV 110.2*  PLT 82*   Basic Metabolic Panel: Recent Labs  Lab 02/05/2019 1440  NA 142  K 3.7  CL 100  CO2 32  GLUCOSE 122*  BUN 28*  CREATININE 1.32*  CALCIUM 9.4   GFR: CrCl cannot be calculated (Unknown ideal weight.). Liver Function Tests: No results for input(s): AST, ALT,  ALKPHOS, BILITOT, PROT, ALBUMIN in the last 168 hours. No results for input(s): LIPASE, AMYLASE in the last 168 hours. No results for input(s): AMMONIA in the last 168 hours. Coagulation Profile: Recent Labs  Lab 02/05/2019 1530  INR 2.3*   Cardiac Enzymes: No results for input(s): CKTOTAL, CKMB, CKMBINDEX, TROPONINI in the last 168 hours. BNP (last 3 results) No results for input(s): PROBNP in the last 8760 hours. HbA1C: No results for input(s): HGBA1C in the last 72 hours. CBG: Recent Labs  Lab 01/31/2019 1936  GLUCAP 87   Lipid Profile: No results for input(s): CHOL, HDL, LDLCALC, TRIG, CHOLHDL, LDLDIRECT in the last 72 hours. Thyroid Function Tests: No results for input(s): TSH, T4TOTAL, FREET4, T3FREE, THYROIDAB in the last 72 hours. Anemia Panel: No results for input(s): VITAMINB12, FOLATE, FERRITIN, TIBC, IRON, RETICCTPCT in the last 72 hours. Urine analysis:    Component Value Date/Time   COLORURINE YELLOW 02/02/2019 0036   APPEARANCEUR CLEAR 02/02/2019 0036   LABSPEC 1.009 02/02/2019 0036   PHURINE 6.0 02/02/2019 0036   GLUCOSEU NEGATIVE 02/02/2019 0036   HGBUR NEGATIVE 02/02/2019 0036   BILIRUBINUR NEGATIVE 02/02/2019 0036   KETONESUR NEGATIVE 02/02/2019 0036   PROTEINUR NEGATIVE 02/02/2019 0036   NITRITE NEGATIVE 02/02/2019 0036   LEUKOCYTESUR NEGATIVE 02/02/2019 0036   Sepsis Labs: _0 (procalcitonin:4,lacticidven:4) )No results found for this or any previous visit (from the past 240 hour(s)).   Radiological Exams on Admission: Dg Knee 2 Views Right  Result Date: 01/31/2019 CLINICAL DATA:  [place].  Right knee pain. EXAM: RIGHT KNEE - 1-2 VIEW COMPARISON:  MRI 01/15/2006 FINDINGS: Advanced tricompartmental degenerative changes most significant in the lateral compartment with joint space narrowing and osteophytic spurring. Fairly extensive chondrocalcinosis is noted. No acute fracture is identified. There is a small to moderate-sized joint effusion.  IMPRESSION: 1. Advanced tricompartmental degenerative changes and chondrocalcinosis. 2. No definite acute fracture. 3. Moderate-sized joint effusion. Electronically Signed   By: Marijo Sanes M.D.   On: 02/23/2019 16:45   Ct Head Wo Contrast  Result Date: 02/28/2019 CLINICAL DATA:  Generalized weakness, altered level of consciousness EXAM: CT HEAD WITHOUT CONTRAST TECHNIQUE: Contiguous axial images were obtained from the base of the skull through the vertex without intravenous contrast. COMPARISON:  06/13/2016, CT 07/17/2014 FINDINGS: Brain: There is a rounded area noted  within the right side of the brainstem which is slightly hyperdense with higher rim density surrounding it. This is new since prior CT from 2015. Suggestion of vascular maceration on prior MRI in the adjacent cistern. Therefore, I favor this represents hemorrhage although underlying metastasis/mass lesion cannot be excluded. Recommend MRI for further evaluation. No additional abnormality. No hydrocephalus. No mass effect or midline shift. Vascular: No hyperdense vessel or unexpected calcification. Skull: No acute calvarial abnormality. Sinuses/Orbits: Visualized paranasal sinuses and mastoids clear. Orbital soft tissues unremarkable. Other: None IMPRESSION: Hyperdense area in the right side of the brainstem concerning for hemorrhage or mass lesion/metastasis. Recommend further evaluation with MRI with and without contrast. Electronically Signed   By: Rolm Baptise M.D.   On: 02/05/2019 16:56   Mr Brain W And Wo Contrast  Result Date: 02/02/2019 CLINICAL DATA:  Weakness and ataxia.  Abnormal head CT. EXAM: MRI HEAD WITHOUT AND WITH CONTRAST TECHNIQUE: Multiplanar, multiecho pulse sequences of the brain and surrounding structures were obtained without and with intravenous contrast. CONTRAST:  10 mL Gadavist COMPARISON:  Head CT 02/19/2019 Brain MRI 06/13/2016 FINDINGS: Brain: There is a lesion within the right pons with low T1 and high  T2-weighted intrinsic signal. There is anterior contrast enhancement within the lesion. The size of the lesion is 1.4 x 1.1 x 1.3 cm. There are no other contrast-enhancing lesions within the brain. There is no acute ischemia. There is mild edema surrounding the lesion extending into the right cerebral peduncle and right middle cerebellar peduncle. There is peripheral hemorrhage surrounding the lesion. There is also chronic microhemorrhage in the peripheral left frontal lobe Vascular: Normal flow voids. Skull and upper cervical spine: Normal marrow signal. Sinuses/Orbits: Paranasal sinuses and mastoid air cells are clear. Normal orbits. IMPRESSION: 1. Contrast-enhancing mass within the right pons with surrounding edema extending into the cerebral and middle cerebellar peduncles. Thin rim of peripheral blood products. Primary differential considerations include hemangioblastoma and pontine glioma. This would be an unusual location for a solitary intracranial metastasis. 2. No acute infarct. Electronically Signed   By: Ulyses Jarred M.D.   On: 02/02/2019 00:39   Dg Chest Port 1 View  Result Date: 02/13/2019 CLINICAL DATA:  [place]. EXAM: PORTABLE CHEST 1 VIEW COMPARISON:  Chest x-ray 11/30/2017 FINDINGS: The heart is mildly enlarged but stable. Stable tortuosity and calcification of the thoracic aorta. Very low lung volumes with vascular crowding and streaky bibasilar atelectasis. No definite pleural effusions. No pneumothorax. The bony thorax is intact. No obvious rib fractures. Both shoulders are normally located. IMPRESSION: Very low lung volumes with vascular crowding and bibasilar atelectasis. Intact bony thorax. Electronically Signed   By: Marijo Sanes M.D.   On: 01/31/2019 16:43   Dg Shoulder Left  Result Date: 02/10/2019 CLINICAL DATA:  [place].  Left shoulder pain. EXAM: LEFT SHOULDER - 2+ VIEW COMPARISON:  None. FINDINGS: Moderate glenohumeral and AC joint degenerative changes with joint space narrowing and  spurring. No acute fracture or dislocation. IMPRESSION: Degenerative changes but no fracture or dislocation. Electronically Signed   By: Marijo Sanes M.D.   On: 02/21/2019 16:46    EKG: Independently reviewed. Atrial fibrillation.   Assessment/Plan   1. Brainstem tumor  - Presents with weakness, progressing to the point where she is unable to get out of bed, and is found to have a mass involving right pons with surrounding edema  - Neurosurgery is consulting and much appreciated  - She was treated with Decadron in ED  - [place], supportive  care, neuro checks   2. Atrial fibrillation - In rate-controlled a fib on admission  - CHADS-VASc is at least 25 (age x2, gender, CHF)  - INR is 2.1 on admission  - Hold warfarin pending neurosurgery evaluation, continue metoprolol as tolerated   3. Chronic diastolic CHF  - She has peripheral edema and elevated BNP, no rales or JVD on admission  - Continue Lasix and beta-blocker as tolerated, follow daily wts, hold ACE-i initially given increased creatinine   4. Hypothyroidism  - Continue Synthroid    5. Aortic stenosis  - She has been referred for TAVR   6. Macrocytic anemia; thrombocytopenia  - Hgb is 10.9 and platelets 82k on admission  - Both indices appear stable with no active bleeding  - She has been evaluated by hematology-oncology and bone marrow biopsy was recommended   7. CKD stage III  - SCr is 1.32 on admission, up slightly from apparent baseline of ~1.1 - Renally-dose medications, hold ramipril initially, repeat chem panel in am    PPE: Mask, face shield. Patient wearing mask  DVT prophylaxis: warfarin pta  Code Status: Full, confirmed with patient  Family Communication: Discussed with patient  Consults called: Neurosurgery  Admission status: Observation     Vianne Bulls, MD [facility] Pager [phone]  If 7PM-7AM, please contact night-coverage [email or web address] Password [email or web address]  02/02/2019, 2:03 AM

## 2019-02-02 NOTE — ED Notes (Signed)
Report called to [place], RN on floor.

## 2019-02-03 ENCOUNTER — Encounter (HOSPITAL_COMMUNITY): Payer: Medicare HMO

## 2019-02-03 ENCOUNTER — Ambulatory Visit (HOSPITAL_COMMUNITY): Payer: Medicare HMO

## 2019-02-03 MED ORDER — MORPHINE SULFATE (PF) 2 MG/ML IV SOLN: 2.0000 mg | INTRAVENOUS | Status: DC | PRN

## 2019-02-03 MED ORDER — MORPHINE SULFATE (PF) 2 MG/ML IV SOLN
2.0000 mg | INTRAVENOUS | Status: DC
  Administered 2019-02-03: 2 mg via INTRAVENOUS
  Filled 2019-02-03: qty 1

## 2019-02-03 MED ORDER — SCOPOLAMINE 1 MG/3DAYS TD PT72
1.0000 | MEDICATED_PATCH | TRANSDERMAL | Status: DC
  Administered 2019-02-03: 1.5 mg via TRANSDERMAL
  Filled 2019-02-03: qty 1

## 2019-02-04 DIAGNOSIS — D496 Neoplasm of unspecified behavior of brain: Secondary | ICD-10-CM | POA: Diagnosis not present

## 2019-02-07 ENCOUNTER — Encounter: Payer: Medicare HMO | Admitting: Thoracic Surgery (Cardiothoracic Vascular Surgery)

## 2019-02-07 ENCOUNTER — Ambulatory Visit: Payer: Medicare HMO | Admitting: Physical Therapy

## 2019-02-18 ENCOUNTER — Encounter (HOSPITAL_COMMUNITY): Payer: Medicare HMO

## 2019-02-21 ENCOUNTER — Other Ambulatory Visit: Payer: Medicare HMO

## 2019-02-21 ENCOUNTER — Ambulatory Visit: Payer: Medicare HMO | Admitting: Oncology

## 2019-03-02 NOTE — Death Summary Note (Addendum)
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[place]) Active Problems:   Permanent atrial fibrillation   Benign essential HTN   Goals of care, counseling/discussion   Chronic diastolic heart failure ([place])   CKD (chronic kidney disease), stage III ([place])   Thrombocytopenia ([place])   Macrocytic anemia   Brain mass     History of present illness:  Sandra Sundberg Penryis a 85 y.o.femalewith medical history significant forchronic kidney disease stage III, atrial fibrillation on warfarin, hypothyroidism, chronic diastolic CHF, aortic stenosis, 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 Patient reports that she developed muscle weakness and some dizziness over the past few days that has rapidly worsened. She felt as though her left leg was very weak yesterday, resulting in a fall. She denies hitting her head or losing consciousness, but has been having pain at her left shoulder and right knee after this. She denies any change in vision or hearing, but has had some mild headaches. Denies fevers or chills, and denies any new cough.  ED Course:Upon arrival to the ED, patient is found to be afebrile, saturating well on room air, and with blood pressure 90/50. EKG features atrial fibrillation. Chest x-ray is notable for low lung volumes with vascular crowding and bibasilar atelectasis. 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 Radiographs of the left shoulder and right knee are negative for acute  fracture or dislocation. Chemistry panel features a creatinine 1.32, up from an apparent baseline of 1.1. CBC is notable for chronic stable microcytic anemia and thrombocytopenia. INR is therapeutic at 2.3, troponin normal, and BNP elevated to 858. Neurosurgery was consulted by the ED physician and the patient was treated with 10 mg IV Decadron in the ED.   Hospital Course:  1-Brain Mass: Patient more lethargic, non responsive, obtunded. Received a very low dose Vicodin last night. I have given her Narcan time 2. No response. I have ordered ABG and repeat CT head.  I have contacted neurosurgery and inform them change in patient condition.  I have also consulted CCM due to concern for need for intubation. I have informed family patient status change and plan for neurosurgical evaluation, CCM evaluation, blood gas. -Dr. Unknown Jim discussed with family, and was made DNR.  Have consulted palliative care for further goals of cares and symptom management, family support. -Discussed with neurosurgery, hold on reversing INR due to patient prior history of atrial  thrombus.  He think patient probably had mass with acute hemorrhage.  He think patient is not a surgical candidate.  Family agree with comfort care. Patient is DNR> expect hospital death.  Continue with comfort care.  patient time of death 16;34 PM/   2-Permanent A fib; history of atrial  thrombus Hold Coumadin, due to #1.  3-Acute encephalopathy; related to brain mass, hypoxemia.   4-hypothyroidism: On Synthroid. 5-Aortic stenosis;  6-microcytic anemia thrombocytopenia chronic 7-chronic kidney disease a stage III    Time: 15;34  Signed:  Shandra Szymborski A Tanayah Squitieri  [facility] 2019-02-20, 4:02 PM

## 2019-03-02 NOTE — Progress Notes (Signed)
SLP Cancellation Note  Patient Details Name: Sandra Merritt MRN: [id number] DOB: [birth year]   Cancelled treatment:       Reason Eval/Treat Not Completed: Other (comment)(Pt's family has decided on comfort care. SLP will sign off at this time)  Roselynne Lortz I. Hardin Negus, [place], [place] Office number [phone] Pager [place] 02/05/19, 8:37 AM

## 2019-03-02 NOTE — Progress Notes (Signed)
Patient's HR is increasing and O2 sat is decreasing. Rhonchi to bil lungs. Family at bedside. Patient is comfort care.  Will continue to monitor.

## 2019-03-02 NOTE — Progress Notes (Signed)
Responded to unit page to support family at bedside.  Patient passed.  Provided ministry of presence, emotional and grief support to patient's three daughters. Per their request I prayed with them and provided words of comfort.  Family expressed their appreciation for the staff and my coming.   Jaclynn Major, [place], [facility], Pager [phone]

## 2019-03-02 NOTE — Progress Notes (Addendum)
Nutrition Brief Note RD working remotely. Chart reviewed. Pt now transitioning to comfort care.  No further nutrition interventions warranted at this time.  Please re-consult as needed.   Charmian Forbis A. Semone Orlov, RD, LDN, CDCES Registered Dietitian II Certified Diabetes Care and Education Specialist Pager: [phone] After hours Pager: [phone]  

## 2019-03-02 NOTE — Progress Notes (Signed)
Was paged to go to room 13. When I went in with charge nurse pt. was unresponsive. Pulses and heart sounds checked. Pt had neither. Time of death announced at 1534. Daughter at bedside at time of death. Doctor, [place] [facility] and chaplin all notified.

## 2019-03-02 NOTE — Progress Notes (Signed)
Palliative:  I met again today at Ms. Serratore's bedside and with her family. I met with her 3 daughters outside room as I was going in. [name] is still trying to be hopeful and tells me that her mother squeezed her hand and attempted to open her eyes and moan to her. I explained that I am not surprised if she attempted some reaction but the problem is that this is very small and she is not awake enough to eat and drink and I expect some fluctuation but still overall decline at EOL. Emotional support provided.   I met with daughter Horris Latino) along with Ms. Faiola's brother (he is a physician) at bedside. We discussed her MRI (I pulled up for brother to see). We discussed her increasing secretions and continued decline. Horris Latino tells me that her BP and HR dropped last night and they placed her on oxygen (venti mask). I explained to her that we can use the oxygen but unfortunately this is only helping a number (her sats) but not adding to her comfort - she needs morphine in order to ensure comfort and addressed any respiratory discomfort. Will schedule morphine to better enable comfort with continued secretions. All questions/concerns addressed. Emotional support provided.   I planned to discussed with other daughters about d/c oxygen and utilizing medication but they have left to return home to rest. Discussed with RN about limiting vital signs and to not be concerned or react to low sats, HR, BP as this would be expected. Limit vitals to daily or if family requests.   Exam: Unresponsive. Irregular respirations but no apnea noted.   Plan: - Full comfort care. Anticipate hospital death.  - Scheduled morphine and scopolamine patch added to ensure comfort.   36 min  Vinie Sill, NP Palliative Medicine Team Pager # [phone] (M-F 8a-5p) Team Phone # [phone] (Nights/Weekends)

## 2019-03-02 NOTE — Progress Notes (Addendum)
PROGRESS NOTE    Sandra Merritt  XQJ:[id number] DOB: 08/10/1932 DOA: 02/17/2019 PCP: Jonathon Jordan, MD    Brief Narrative: Sandra Merritt is a 85 y.o. 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  Patient reports that she developed muscle weakness and some dizziness over the past few days that has rapidly worsened.  She felt as though her left leg was very weak yesterday, resulting in a fall.  She denies hitting her head or losing consciousness, but has been having pain at her left shoulder and right knee after this.  She denies any change in vision or hearing, but has had some mild headaches.  Denies fevers or chills, and denies any new cough.  ED Course: Upon arrival to the ED, patient is found to be afebrile, saturating well on room air, and with blood pressure 90/50.  EKG features atrial fibrillation.  Chest x-ray is notable for low lung volumes with vascular crowding and bibasilar atelectasis.  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  Radiographs of the left shoulder and right knee are negative for acute fracture or dislocation.  Chemistry panel features a creatinine 1.32, up from an apparent baseline of 1.1.  CBC is notable for chronic stable microcytic anemia and thrombocytopenia.  INR is therapeutic at 2.3, troponin normal, and BNP elevated to 858.  Neurosurgery was consulted by the ED physician and the patient was treated with 10 mg IV Decadron in the ED.   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 Received a very low dose Vicodin last night. I have given her Narcan time 2. No response. I have ordered ABG and repeat CT head.  I have contacted neurosurgery and inform them change in patient condition.  I have also consulted CCM due to concern for need for intubation. I have informed family patient status change and plan for neurosurgical evaluation, CCM evaluation, blood gas. -Dr. Unknown Jim discussed with family, and was made DNR.  Have consulted palliative care for further goals of cares and symptom management, family support. -Discussed with neurosurgery, hold on reversing INR due to patient prior history of atrial  thrombus.  He think patient probably had mass with acute hemorrhage.  He think patient is not a surgical candidate.  Family agree with comfort care. Patient is DNR> expect hospital death.  Patient appears comfortable on exam.  Continue with comfort care.   2-Permanent A fib; history of atrial  thrombus Hold Coumadin, due to #1.  3-Acute encephalopathy; related to #1  4-hypothyroidism: On Synthroid. 5-Aortic stenosis;  6-microcytic anemia thrombocytopenia chronic 7-chronic kidney disease a stage III   Estimated body mass index is 36.56 kg/m as calculated from the following:   Height as of 12/24/18: 5\' 4"  (1.626 m).  Weight as of 01/11/19: 96.6 kg.   DVT prophylaxis: SCDs Code Status: Now DNR Family Communication: Multiple family at bedside. Discussed MRI finding and prognosis.  Disposition Plan: Suspect hospital death  Consultants:   Neurosurgery  CCM  Palliative   Procedures:   none   Antimicrobials:    none   Subjective: Obtunded, mild tachypnea.   Objective: Vitals:   02/02/19 1251 02/02/19 1541 02-28-19 0028 February 28, 2019 0555  BP: (!) 96/52 (!) 120/57 (!) 97/52 (!) 102/53  Pulse: 84 76 (!) 170 96  Resp: (!) 26 14 12 15   Temp:  98.4 F (36.9 C) 98 F (36.7 C) 98.6 F (37 C)  TempSrc:  Oral Axillary Oral  SpO2: 100% 91% (!) 81% (!) 74%    Intake/Output Summary (Last 24 hours) at Feb 28, 2019 1325 Last data filed at February 28, 2019 0500 Gross per 24 hour  Intake 0 ml  Output -  Net 0 ml   There were no vitals filed for this visit.  Examination:  General exam: Obtunded.  Respiratory system: mild tachypnea.  Cardiovascular system: S 1, S 2  Gastrointestinal system:  Abdomen distended Central nervous system: obtunded Extremities: no edema    Data Reviewed: I have personally reviewed following labs and imaging studies  CBC: Recent Labs  Lab 02/20/2019 1440 02/02/19 0337 02/02/19 0908  WBC 4.1 4.2  --   NEUTROABS  --  3.3  --   HGB 10.7* 10.4* 11.6*  HCT 33.5* 31.9* 34.0*  MCV 110.2* 109.6*  --   PLT 82* 79*  --    Basic Metabolic Panel: Recent Labs  Lab 01/31/2019 1440 02/02/19 0337 02/02/19 0908  NA 142 144 142  K 3.7 3.8 4.1  CL 100 101  --   CO2 32 31  --   GLUCOSE 122* 105*  --   BUN 28* 25*  --   CREATININE 1.32* 1.09*  --   CALCIUM 9.4 9.4  --    GFR: CrCl cannot be calculated (Unknown ideal weight.). Liver Function Tests: No results for input(s): AST, ALT, ALKPHOS, BILITOT, PROT, ALBUMIN in the last 168 hours. No results for input(s): LIPASE, AMYLASE in the last 168 hours. No results for input(s): AMMONIA in the last 168 hours. Coagulation Profile: Recent Labs  Lab 02/02/2019 1530 02/02/19 0844  INR 2.3* 2.3*   Cardiac Enzymes: No results for input(s): CKTOTAL, CKMB, CKMBINDEX, TROPONINI in the last 168 hours. BNP (last 3 results) No results for input(s): PROBNP in the last 8760 hours. HbA1C: No results for input(s): HGBA1C in the last 72 hours.  CBG: Recent Labs  Lab 02/05/2019 1936 02/02/19 0743  GLUCAP 87 122*   Lipid Profile: No results for input(s): CHOL, HDL, LDLCALC, TRIG, CHOLHDL, LDLDIRECT in the last 72 hours. Thyroid Function Tests: No results for input(s): TSH, T4TOTAL, FREET4, T3FREE, THYROIDAB in the last 72 hours. Anemia Panel: No results for input(s): VITAMINB12, FOLATE, FERRITIN, TIBC, IRON, RETICCTPCT in the last 72 hours. Sepsis Labs: No results for input(s): PROCALCITON, LATICACIDVEN in the last 168 hours.  Recent Results (from the past 240 hour(s))  SARS Coronavirus 2 (CEPHEID - [facility]), Hosp Order     Status: None   Collection Time: 02/02/19  1:22 AM  Result Value Ref Range Status   SARS Coronavirus 2 NEGATIVE NEGATIVE Final    Comment: (NOTE) If result is NEGATIVE SARS-CoV-2 target nucleic acids are NOT DETECTED. The SARS-CoV-2 RNA is generally detectable in upper and lower  respiratory specimens during the acute  phase of infection. The lowest  concentration of SARS-CoV-2 viral copies this assay can detect is 250  copies / mL. A negative result does not preclude SARS-CoV-2 infection  and should not be used as the sole basis for treatment or other  patient management decisions.  A negative result may occur with  improper specimen collection / handling, submission of specimen other  than nasopharyngeal swab, presence of viral mutation(s) within the  areas targeted by this assay, and inadequate number of viral copies  (<250 copies / mL). A negative result must be combined with clinical  observations, patient history, and epidemiological information. If result is POSITIVE SARS-CoV-2 target nucleic acids are DETECTED. The SARS-CoV-2 RNA is generally detectable in upper and lower  respiratory specimens dur ing the acute phase of infection.  Positive  results are indicative of active infection with SARS-CoV-2.  Clinical  correlation with patient history and other  diagnostic information is  necessary to determine patient infection status.  Positive results do  not rule out bacterial infection or co-infection with other viruses. If result is PRESUMPTIVE POSTIVE SARS-CoV-2 nucleic acids MAY BE PRESENT.   A presumptive positive result was obtained on the submitted specimen  and confirmed on repeat testing.  While 2019 novel coronavirus  (SARS-CoV-2) nucleic acids may be present in the submitted sample  additional confirmatory testing may be necessary for epidemiological  and / or clinical management purposes  to differentiate between  SARS-CoV-2 and other Sarbecovirus currently known to infect humans.  If clinically indicated additional testing with an alternate test  methodology [phone]) is advised. The SARS-CoV-2 RNA is generally  detectable in upper and lower respiratory sp ecimens during the acute  phase of infection. The expected result is Negative. Fact Sheet for Patients:  StrictlyIdeas.no Fact Sheet for Healthcare Providers: BankingDealers.co.za This test is not yet approved or cleared by the [place] FDA and has been authorized for detection and/or diagnosis of SARS-CoV-2 by FDA under an Emergency Use Authorization (EUA).  This EUA will remain in effect (meaning this test can be used) for the duration of the COVID-19 declaration under Section 564(b)(1) of the Act, 21 U.S.C. section 360bbb-3(b)(1), unless the authorization is terminated or revoked sooner. Performed at [facility], [place] [address], [place], [place] [address]          Radiology Studies: Dg Knee 2 Views Right  Result Date: 02/19/2019 CLINICAL DATA:  [place].  Right knee pain. EXAM: RIGHT KNEE - 1-2 VIEW COMPARISON:  MRI 01/15/2006 FINDINGS: Advanced tricompartmental degenerative changes most significant in the lateral compartment with joint space narrowing and osteophytic spurring. Fairly extensive chondrocalcinosis  is noted. No acute fracture is identified. There is a small to moderate-sized joint effusion. IMPRESSION: 1. Advanced tricompartmental degenerative changes and chondrocalcinosis. 2. No definite acute fracture. 3. Moderate-sized joint effusion. Electronically Signed   By: Marijo Sanes M.D.   On: 02/18/2019 16:45   Ct Head Wo Contrast  Result Date: 02/06/2019 CLINICAL DATA:  Generalized weakness, altered level of consciousness EXAM: CT HEAD WITHOUT CONTRAST TECHNIQUE: Contiguous axial images were obtained from the base of the skull through the vertex without intravenous contrast. COMPARISON:  06/13/2016, CT 07/17/2014 FINDINGS: Brain: There is a rounded area noted within the right side of the brainstem which is slightly hyperdense with higher rim density surrounding it. This is new since prior CT from 2015. Suggestion of vascular maceration on prior MRI in the adjacent cistern. Therefore, I favor this represents hemorrhage although underlying metastasis/mass lesion cannot  be excluded. Recommend MRI for further evaluation. No additional abnormality. No hydrocephalus. No mass effect or midline shift. Vascular: No hyperdense vessel or unexpected calcification. Skull: No acute calvarial abnormality. Sinuses/Orbits: Visualized paranasal sinuses and mastoids clear. Orbital soft tissues unremarkable. Other: None IMPRESSION: Hyperdense area in the right side of the brainstem concerning for hemorrhage or mass lesion/metastasis. Recommend further evaluation with MRI with and without contrast. Electronically Signed   By: Rolm Baptise M.D.   On: 02/25/2019 16:56   Mr Brain W And Wo Contrast  Result Date: 02/02/2019 CLINICAL DATA:  Weakness and ataxia.  Abnormal head CT. EXAM: MRI HEAD WITHOUT AND WITH CONTRAST TECHNIQUE: Multiplanar, multiecho pulse sequences of the brain and surrounding structures were obtained without and with intravenous contrast. CONTRAST:  10 mL Gadavist COMPARISON:  Head CT 02/23/2019 Brain MRI  06/13/2016 FINDINGS: Brain: There is a lesion within the right pons with low T1 and high T2-weighted intrinsic signal. There is anterior contrast enhancement within the lesion. The size of the lesion is 1.4 x 1.1 x 1.3 cm. There are no other contrast-enhancing lesions within the brain. There is no acute ischemia. There is mild edema surrounding the lesion extending into the right cerebral peduncle and right middle cerebellar peduncle. There is peripheral hemorrhage surrounding the lesion. There is also chronic microhemorrhage in the peripheral left frontal lobe Vascular: Normal flow voids. Skull and upper cervical spine: Normal marrow signal. Sinuses/Orbits: Paranasal sinuses and mastoid air cells are clear. Normal orbits. IMPRESSION: 1. Contrast-enhancing mass within the right pons with surrounding edema extending into the cerebral and middle cerebellar peduncles. Thin rim of peripheral blood products. Primary differential considerations include hemangioblastoma and pontine glioma. This would be an unusual location for a solitary intracranial metastasis. 2. No acute infarct. Electronically Signed   By: Ulyses Jarred M.D.   On: 02/02/2019 00:39   Dg Chest Port 1 View  Result Date: 02/07/2019 CLINICAL DATA:  [place]. EXAM: PORTABLE CHEST 1 VIEW COMPARISON:  Chest x-ray 11/30/2017 FINDINGS: The heart is mildly enlarged but stable. Stable tortuosity and calcification of the thoracic aorta. Very low lung volumes with vascular crowding and streaky bibasilar atelectasis. No definite pleural effusions. No pneumothorax. The bony thorax is intact. No obvious rib fractures. Both shoulders are normally located. IMPRESSION: Very low lung volumes with vascular crowding and bibasilar atelectasis. Intact bony thorax. Electronically Signed   By: Marijo Sanes M.D.   On: 02/28/2019 16:43   Dg Shoulder Left  Result Date: 02/22/2019 CLINICAL DATA:  [place].  Left shoulder pain. EXAM: LEFT SHOULDER - 2+ VIEW COMPARISON:  None.  FINDINGS: Moderate glenohumeral and AC joint degenerative changes with joint space narrowing and spurring. No acute fracture or dislocation. IMPRESSION: Degenerative changes but no fracture or dislocation. Electronically Signed   By: Marijo Sanes M.D.   On: 02/04/2019 16:46        Scheduled Meds: . dexamethasone  4 mg Intravenous Q6H  . glycopyrrolate  0.4 mg Intravenous Q4H  .  morphine injection  2 mg Intravenous Q4H  . scopolamine  1 patch Transdermal Q72H  . sodium chloride flush  3 mL Intravenous Q12H  . sodium chloride flush  3 mL Intravenous Q12H   Continuous Infusions: . sodium chloride       LOS: 1 day    Time spent: 35 minutes.     Elmarie Shiley, MD [facility] Pager [phone]  If 7PM-7AM, please contact night-coverage [email or web address] Password [email or web address] 03/01/2019, 1:25 PM

## 2019-03-02 NOTE — Progress Notes (Signed)
Responded to call from nurse on unit.  Patient had died this afternoon, and most family members were present when [employer] came to offer comfort.  But daughter Seth Bake arrived later,  And was "having a hard time" so the family thought another chaplain visit would help. Chaplain provided ministry of support and prayer.  Words of comfort were provided by the family and chaplain to the patient's youngest child.  Rev. Tamsen Snider Pager [phone]

## 2019-03-02 DEATH — deceased

## 2019-04-11 ENCOUNTER — Encounter (HOSPITAL_COMMUNITY): Payer: Medicare HMO | Admitting: Cardiology
